# Patient Record
Sex: Female | Born: 1945 | Race: White | Hispanic: No | Marital: Married | Smoking: Never smoker
Health system: Southern US, Community
[De-identification: ages and names within clinical notes are randomized; demographics above are authoritative.]

## PROBLEM LIST (undated history)

## (undated) DIAGNOSIS — N189 Chronic kidney disease, unspecified: Secondary | ICD-10-CM

## (undated) DIAGNOSIS — K589 Irritable bowel syndrome without diarrhea: Secondary | ICD-10-CM

## (undated) DIAGNOSIS — M8580 Other specified disorders of bone density and structure, unspecified site: Secondary | ICD-10-CM

## (undated) DIAGNOSIS — I10 Essential (primary) hypertension: Secondary | ICD-10-CM

## (undated) DIAGNOSIS — M1990 Unspecified osteoarthritis, unspecified site: Secondary | ICD-10-CM

## (undated) DIAGNOSIS — K219 Gastro-esophageal reflux disease without esophagitis: Secondary | ICD-10-CM

## (undated) DIAGNOSIS — I499 Cardiac arrhythmia, unspecified: Secondary | ICD-10-CM

## (undated) DIAGNOSIS — F429 Obsessive-compulsive disorder, unspecified: Secondary | ICD-10-CM

## (undated) DIAGNOSIS — D0510 Intraductal carcinoma in situ of unspecified breast: Principal | ICD-10-CM

## (undated) DIAGNOSIS — C50919 Malignant neoplasm of unspecified site of unspecified female breast: Secondary | ICD-10-CM

## (undated) DIAGNOSIS — Z8719 Personal history of other diseases of the digestive system: Secondary | ICD-10-CM

## (undated) DIAGNOSIS — F32A Depression, unspecified: Secondary | ICD-10-CM

## (undated) DIAGNOSIS — J189 Pneumonia, unspecified organism: Secondary | ICD-10-CM

## (undated) DIAGNOSIS — I8393 Asymptomatic varicose veins of bilateral lower extremities: Secondary | ICD-10-CM

## (undated) DIAGNOSIS — T148XXA Other injury of unspecified body region, initial encounter: Secondary | ICD-10-CM

## (undated) DIAGNOSIS — F419 Anxiety disorder, unspecified: Secondary | ICD-10-CM

## (undated) DIAGNOSIS — N6099 Unspecified benign mammary dysplasia of unspecified breast: Secondary | ICD-10-CM

## (undated) DIAGNOSIS — F329 Major depressive disorder, single episode, unspecified: Secondary | ICD-10-CM

## (undated) HISTORY — PX: DILATION AND CURETTAGE OF UTERUS: SHX78

## (undated) HISTORY — PX: UPPER GI ENDOSCOPY: SHX6162

## (undated) HISTORY — DX: Other specified disorders of bone density and structure, unspecified site: M85.80

## (undated) HISTORY — DX: Other injury of unspecified body region, initial encounter: T14.8XXA

## (undated) HISTORY — DX: Gastro-esophageal reflux disease without esophagitis: K21.9

## (undated) HISTORY — PX: FOOT SURGERY: SHX648

## (undated) HISTORY — PX: COLONOSCOPY: SHX174

## (undated) HISTORY — DX: Essential (primary) hypertension: I10

## (undated) HISTORY — DX: Unspecified osteoarthritis, unspecified site: M19.90

## (undated) HISTORY — DX: Intraductal carcinoma in situ of unspecified breast: D05.10

## (undated) HISTORY — PX: EYE SURGERY: SHX253

## (undated) HISTORY — PX: BREAST BIOPSY: SHX20

## (undated) HISTORY — PX: DIAGNOSTIC LAPAROSCOPY: SUR761

## (undated) HISTORY — PX: TUBAL LIGATION: SHX77

## (undated) HISTORY — DX: Malignant neoplasm of unspecified site of unspecified female breast: C50.919

## (undated) HISTORY — DX: Unspecified benign mammary dysplasia of unspecified breast: N60.99

---

## 1999-07-26 ENCOUNTER — Other Ambulatory Visit: Admission: RE | Admit: 1999-07-26 | Discharge: 1999-07-26 | Payer: Self-pay | Admitting: Obstetrics and Gynecology

## 2000-03-06 ENCOUNTER — Ambulatory Visit (HOSPITAL_BASED_OUTPATIENT_CLINIC_OR_DEPARTMENT_OTHER): Admission: RE | Admit: 2000-03-06 | Discharge: 2000-03-06 | Payer: Self-pay | Admitting: Surgery

## 2000-08-04 ENCOUNTER — Other Ambulatory Visit: Admission: RE | Admit: 2000-08-04 | Discharge: 2000-08-04 | Payer: Self-pay | Admitting: Obstetrics and Gynecology

## 2001-08-10 ENCOUNTER — Other Ambulatory Visit: Admission: RE | Admit: 2001-08-10 | Discharge: 2001-08-10 | Payer: Self-pay | Admitting: Obstetrics and Gynecology

## 2002-04-13 ENCOUNTER — Encounter (INDEPENDENT_AMBULATORY_CARE_PROVIDER_SITE_OTHER): Payer: Self-pay

## 2002-04-13 ENCOUNTER — Ambulatory Visit (HOSPITAL_COMMUNITY): Admission: RE | Admit: 2002-04-13 | Discharge: 2002-04-13 | Payer: Self-pay | Admitting: Obstetrics and Gynecology

## 2002-08-23 ENCOUNTER — Other Ambulatory Visit: Admission: RE | Admit: 2002-08-23 | Discharge: 2002-08-23 | Payer: Self-pay | Admitting: Obstetrics and Gynecology

## 2003-02-19 HISTORY — PX: CERVICAL FUSION: SHX112

## 2003-03-16 ENCOUNTER — Inpatient Hospital Stay (HOSPITAL_COMMUNITY): Admission: RE | Admit: 2003-03-16 | Discharge: 2003-03-18 | Payer: Self-pay | Admitting: Neurosurgery

## 2003-03-16 ENCOUNTER — Encounter: Payer: Self-pay | Admitting: Neurosurgery

## 2003-09-23 ENCOUNTER — Other Ambulatory Visit: Admission: RE | Admit: 2003-09-23 | Discharge: 2003-09-23 | Payer: Self-pay | Admitting: Obstetrics and Gynecology

## 2004-10-08 ENCOUNTER — Other Ambulatory Visit: Admission: RE | Admit: 2004-10-08 | Discharge: 2004-10-08 | Payer: Self-pay | Admitting: Obstetrics and Gynecology

## 2005-10-09 ENCOUNTER — Other Ambulatory Visit: Admission: RE | Admit: 2005-10-09 | Discharge: 2005-10-09 | Payer: Self-pay | Admitting: Obstetrics and Gynecology

## 2006-09-05 ENCOUNTER — Encounter: Admission: RE | Admit: 2006-09-05 | Discharge: 2006-09-05 | Payer: Self-pay | Admitting: Family Medicine

## 2006-10-09 ENCOUNTER — Other Ambulatory Visit: Admission: RE | Admit: 2006-10-09 | Discharge: 2006-10-09 | Payer: Self-pay | Admitting: Obstetrics & Gynecology

## 2007-10-13 ENCOUNTER — Other Ambulatory Visit: Admission: RE | Admit: 2007-10-13 | Discharge: 2007-10-13 | Payer: Self-pay | Admitting: Obstetrics and Gynecology

## 2008-10-27 ENCOUNTER — Other Ambulatory Visit: Admission: RE | Admit: 2008-10-27 | Discharge: 2008-10-27 | Payer: Self-pay | Admitting: Obstetrics & Gynecology

## 2008-10-31 ENCOUNTER — Encounter: Admission: RE | Admit: 2008-10-31 | Discharge: 2008-10-31 | Payer: Self-pay | Admitting: Neurosurgery

## 2008-11-18 ENCOUNTER — Encounter: Admission: RE | Admit: 2008-11-18 | Discharge: 2008-11-18 | Payer: Self-pay | Admitting: Neurosurgery

## 2008-12-09 ENCOUNTER — Encounter: Admission: RE | Admit: 2008-12-09 | Discharge: 2008-12-09 | Payer: Self-pay | Admitting: Neurosurgery

## 2009-10-10 ENCOUNTER — Encounter: Admission: RE | Admit: 2009-10-10 | Discharge: 2009-10-10 | Payer: Self-pay | Admitting: Family Medicine

## 2009-12-10 ENCOUNTER — Encounter: Admission: RE | Admit: 2009-12-10 | Discharge: 2009-12-10 | Payer: Self-pay | Admitting: Radiology

## 2010-01-01 ENCOUNTER — Ambulatory Visit (HOSPITAL_BASED_OUTPATIENT_CLINIC_OR_DEPARTMENT_OTHER): Admission: RE | Admit: 2010-01-01 | Discharge: 2010-01-01 | Payer: Self-pay | Admitting: Surgery

## 2010-01-15 ENCOUNTER — Ambulatory Visit: Payer: Self-pay | Admitting: Oncology

## 2010-01-15 LAB — CBC WITH DIFFERENTIAL/PLATELET
BASO%: 0.4 % (ref 0.0–2.0)
LYMPH%: 25 % (ref 14.0–49.7)
MCH: 33.1 pg (ref 25.1–34.0)
MCHC: 34 g/dL (ref 31.5–36.0)
MCV: 97.2 fL (ref 79.5–101.0)
MONO%: 10.6 % (ref 0.0–14.0)
Platelets: 269 10*3/uL (ref 145–400)
RBC: 4.14 10*6/uL (ref 3.70–5.45)
WBC: 8.4 10*3/uL (ref 3.9–10.3)

## 2010-01-23 ENCOUNTER — Ambulatory Visit: Admission: RE | Admit: 2010-01-23 | Discharge: 2010-04-18 | Payer: Self-pay | Admitting: Radiation Oncology

## 2010-03-29 ENCOUNTER — Ambulatory Visit: Payer: Self-pay | Admitting: Oncology

## 2010-04-02 LAB — CBC WITH DIFFERENTIAL/PLATELET
Basophils Absolute: 0 10*3/uL (ref 0.0–0.1)
Eosinophils Absolute: 0.1 10*3/uL (ref 0.0–0.5)
HGB: 14.1 g/dL (ref 11.6–15.9)
MCV: 95.7 fL (ref 79.5–101.0)
MONO%: 10.8 % (ref 0.0–14.0)
NEUT#: 3.1 10*3/uL (ref 1.5–6.5)
RDW: 13.2 % (ref 11.2–14.5)

## 2010-04-02 LAB — BASIC METABOLIC PANEL
BUN: 16 mg/dL (ref 6–23)
Chloride: 103 mEq/L (ref 96–112)
Glucose, Bld: 77 mg/dL (ref 70–99)
Potassium: 4.2 mEq/L (ref 3.5–5.3)

## 2010-04-30 ENCOUNTER — Ambulatory Visit: Payer: Self-pay | Admitting: Oncology

## 2010-04-30 LAB — CBC WITH DIFFERENTIAL/PLATELET
Eosinophils Absolute: 0.1 10*3/uL (ref 0.0–0.5)
MONO#: 0.5 10*3/uL (ref 0.1–0.9)
NEUT#: 2.1 10*3/uL (ref 1.5–6.5)
RBC: 4.16 10*6/uL (ref 3.70–5.45)
RDW: 13.5 % (ref 11.2–14.5)
WBC: 3.6 10*3/uL — ABNORMAL LOW (ref 3.9–10.3)
lymph#: 0.9 10*3/uL (ref 0.9–3.3)

## 2010-04-30 LAB — COMPREHENSIVE METABOLIC PANEL
Albumin: 4.1 g/dL (ref 3.5–5.2)
Alkaline Phosphatase: 43 U/L (ref 39–117)
CO2: 24 mEq/L (ref 19–32)
Chloride: 101 mEq/L (ref 96–112)
Glucose, Bld: 87 mg/dL (ref 70–99)
Potassium: 4.4 mEq/L (ref 3.5–5.3)
Sodium: 136 mEq/L (ref 135–145)
Total Protein: 7 g/dL (ref 6.0–8.3)

## 2010-04-30 LAB — LIPID PANEL
Cholesterol: 240 mg/dL — ABNORMAL HIGH (ref 0–200)
LDL Cholesterol: 128 mg/dL — ABNORMAL HIGH (ref 0–99)
Triglycerides: 69 mg/dL (ref <150)

## 2010-06-14 ENCOUNTER — Ambulatory Visit: Payer: Self-pay | Admitting: Oncology

## 2010-06-18 LAB — COMPREHENSIVE METABOLIC PANEL
Albumin: 4.2 g/dL (ref 3.5–5.2)
BUN: 16 mg/dL (ref 6–23)
CO2: 30 mEq/L (ref 19–32)
Glucose, Bld: 107 mg/dL — ABNORMAL HIGH (ref 70–99)
Potassium: 5.1 mEq/L (ref 3.5–5.3)
Sodium: 139 mEq/L (ref 135–145)
Total Protein: 6.9 g/dL (ref 6.0–8.3)

## 2010-06-18 LAB — CBC WITH DIFFERENTIAL/PLATELET
Basophils Absolute: 0 10*3/uL (ref 0.0–0.1)
Eosinophils Absolute: 0.1 10*3/uL (ref 0.0–0.5)
LYMPH%: 18.8 % (ref 14.0–49.7)
MONO#: 0.6 10*3/uL (ref 0.1–0.9)
Platelets: 260 10*3/uL (ref 145–400)
RBC: 4.35 10*6/uL (ref 3.70–5.45)

## 2010-06-21 HISTORY — PX: BREAST LUMPECTOMY: SHX2

## 2010-06-27 ENCOUNTER — Ambulatory Visit (HOSPITAL_BASED_OUTPATIENT_CLINIC_OR_DEPARTMENT_OTHER): Admission: RE | Admit: 2010-06-27 | Discharge: 2010-06-27 | Payer: Self-pay | Admitting: Surgery

## 2010-08-30 ENCOUNTER — Ambulatory Visit: Payer: Self-pay | Admitting: Oncology

## 2010-09-03 LAB — COMPREHENSIVE METABOLIC PANEL
ALT: 14 U/L (ref 0–35)
AST: 22 U/L (ref 0–37)
CO2: 27 mEq/L (ref 19–32)
Chloride: 101 mEq/L (ref 96–112)
Sodium: 135 mEq/L (ref 135–145)
Total Bilirubin: 0.3 mg/dL (ref 0.3–1.2)
Total Protein: 6.9 g/dL (ref 6.0–8.3)

## 2010-09-03 LAB — CBC WITH DIFFERENTIAL/PLATELET
BASO%: 0.5 % (ref 0.0–2.0)
EOS%: 3.2 % (ref 0.0–7.0)
LYMPH%: 26.6 % (ref 14.0–49.7)
MCHC: 34.9 g/dL (ref 31.5–36.0)
MONO#: 0.6 10*3/uL (ref 0.1–0.9)
RBC: 3.97 10*6/uL (ref 3.70–5.45)
WBC: 4.7 10*3/uL (ref 3.9–10.3)
lymph#: 1.2 10*3/uL (ref 0.9–3.3)

## 2010-09-10 ENCOUNTER — Ambulatory Visit (HOSPITAL_COMMUNITY)
Admission: RE | Admit: 2010-09-10 | Discharge: 2010-09-10 | Payer: Self-pay | Source: Home / Self Care | Admitting: Oncology

## 2010-10-21 HISTORY — PX: KNEE ARTHROSCOPY: SUR90

## 2010-11-11 ENCOUNTER — Encounter: Payer: Self-pay | Admitting: Obstetrics and Gynecology

## 2010-12-13 ENCOUNTER — Other Ambulatory Visit: Payer: Self-pay | Admitting: Radiation Oncology

## 2010-12-13 ENCOUNTER — Ambulatory Visit: Payer: BC Managed Care – PPO | Attending: Radiation Oncology | Admitting: Radiation Oncology

## 2010-12-13 DIAGNOSIS — Z9889 Other specified postprocedural states: Secondary | ICD-10-CM

## 2011-01-03 LAB — POCT I-STAT, CHEM 8
Calcium, Ion: 1.12 mmol/L (ref 1.12–1.32)
Creatinine, Ser: 1 mg/dL (ref 0.4–1.2)
Hemoglobin: 15.6 g/dL — ABNORMAL HIGH (ref 12.0–15.0)
Sodium: 137 mEq/L (ref 135–145)
TCO2: 27 mmol/L (ref 0–100)

## 2011-01-11 LAB — BASIC METABOLIC PANEL
BUN: 15 mg/dL (ref 6–23)
CO2: 29 mEq/L (ref 19–32)
Calcium: 9.5 mg/dL (ref 8.4–10.5)
Creatinine, Ser: 0.96 mg/dL (ref 0.4–1.2)
GFR calc non Af Amer: 59 mL/min — ABNORMAL LOW (ref >60)
Glucose, Bld: 64 mg/dL — ABNORMAL LOW (ref 70–99)
Sodium: 137 mEq/L (ref 135–145)

## 2011-03-08 NOTE — Op Note (Signed)
[facility]  Patient:    Morgan Elliott, Morgan Elliott Visit Number: [id number] MRN: [id number]          Service Type: DSU Location: DAY Attending Physician:  Rosalee Kaufman Dictated by:   Harl Bowie, M.D. Proc. Date: 04/13/02 Admit Date:  04/13/2002                             Operative Report  PREOPERATIVE DIAGNOSIS:  Menometrorrhagia with hormone replacement therapy and history of endometrial polyps.  POSTOPERATIVE DIAGNOSES:  Menometrorrhagia with hormone replacement therapy and history of endometrial polyps.  OPERATION:  D&C.  SURGEON:  Harl Bowie, M.D.  ANESTHESIA:  MAC with local.  FINDINGS AND PROCEDURE:  The patient was prepped and draped in the usual sterile fashion for vaginal procedure.  The patient examined and found to have a uterus normal size in the mid position.  Following, the cervix was grasped with a single-tooth tenaculum and the cervix was sounded to three inches.  The cervix was then dilated and the cavity entered with a #2 sharp curet.  A large amount of polypoid tissue was obtained.  No additional tissue was obtained with the Randall-Stone forceps and a serrated curet.  BLOOD LOSS:  Minimal.  DISPOSITION:  The patient tolerated the procedure well and was sent to the recovery room in good condition. Dictated by:   Harl Bowie, M.D. Attending Physician:  Rosalee Kaufman DD:  04/13/02 TD:  04/14/02 Job: [id number]

## 2011-03-08 NOTE — Op Note (Signed)
[place]  Patient:    Morgan Elliott, Morgan Elliott                       MRN: [id number] Proc. Date: 03/06/00 Adm. Date:  [id number] Disc. Date: [id number] Attending:  Charlton Haws CC:         [facility]             Jeralyn Ruths, M.D.                           Operative Report  CCS#: [id number]  PREOPERATIVE DIAGNOSIS:  Right breast mass upper/inner quadrant.  POSTOPERATIVE DIAGNOSIS:  Right breast mass upper/inner quadrant.  OPERATION:  Removal of right breast mass.  SURGEON:  Currie Paris, M.D.  ASSISTANTLorin Picket Long, PA-student.  ANESTHESIA:  MAC.  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  DESCRIPTION OF PROCEDURE:  Patient brought to the operating room and the mass was identified and the area of the skin over it marked.  She was then given IV sedation.  The breast was prepped and draped.  A combination of 1% Xylocaine plus 0.5% Marcaine was mixed equally and used for local.  The incision was made.  Subcutaneous tissues divided and the mass was palpable.  I was able to grasp the breast tissue with a towel clip and elevate it and this slid off of the mass, but the second towel clip went through it and used that for traction.  The mass was excised using cautery and had a little rim of normal breast tissue around it.  The incision was checked for hemostasis and when dry, closed with 3-0 Vicryl, followed by 4-0 Monocryl subcuticular plus and Steri-Strips.  Patient tolerated the procedure well.  There were no operative complications.  All counts were correct. DD:  03/06/00 TD:  03/11/00 Job: [id number]

## 2011-03-08 NOTE — Op Note (Signed)
NAME:  Morgan Elliott, Morgan Elliott                          ACCOUNT NO.:  [id number]   MEDICAL RECORD NO.:  [id number]                   PATIENT TYPE:  INP   LOCATION:  3172                                 FACILITY:  [facility]   PHYSICIAN:  Hewitt Shorts, M.D.            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   POSTOPERATIVE DIAGNOSIS:  C4-5, C5-6 and C6-7 degenerative disk disease and  spondylosis with radiculopathy and C3-4 instability.   OPERATION PERFORMED:  C3-4, C4-5, C5-6 and C6-7 anterior cervical diskectomy  and arthrodesis with iliac crest allograft and Premier cervical plating.   SURGEON:  Hewitt Shorts, M.D.   ASSISTANT:  Coletta Memos, M.D.   ANESTHESIA:  General endotracheal.   INDICATIONS FOR PROCEDURE:  The patient is a 65 year old woman who presented  with weakness in the left upper extremity who was found to have advanced  spondylosis and degenerative disk disease C4-5, C5-6 and C6-7.  __________ x-  ray showed hypermobility consistent with instability at the C3-4 level and  decision was made to proceed with a 4 level anterior cervical diskectomy and  arthrodesis.   DESCRIPTION OF PROCEDURE:  The patient was brought to the operating room and  placed under general endotracheal anesthesia.  The patient was placed in 10  pounds of halter traction and the neck was prepped with Betadine soap and  solution and draped in sterile fashion.  An oblique incision was made in the  left side of the neck.  The line of incision was infiltrated with local  anesthetic with epinephrine.  The incision followed the anterior border of  the left sternocleidomastoid.  Dissection was carried down through the  subcutaneous tissue to the platysma and then dissection was  carried out to  an avascular plane leaving the sternocleidomastoid muscle, carotid artery  and jugular vein laterally and trachea and esophagus medially.  The ventral  aspect of the vertebral column was identified and localizing x-ray was taken  and the C3-4, C4-5, C5-6 and C6-7 intervertebral disk spaces were  identified.  Anterior osteophytic overgrowth was removed and the annulus was  incised at each level and the disk space entered.  Diskectomy was performed  using microcurets and pituitary rongeurs.  The cartilaginous end plates of  each of the corresponding vertebrae was removed using the microcurets along  with Micromax drill.  The operating microscope was draped and brought into  the field to provide additional magnification, illumination and  visualization and the remainder of the diskectomy was performed at each  level using microdissection and microsurgical technique.  Posterior  osteophytic overgrowth was removed using the Micromax drill along with the 2  mm Kerrison punch and a thin foot plate.  The posterior longitudinal  ligament which was thickened particularly at the lower three levels was  removed at each of the levels and good decompression of the spinal canal and  thecal sac was achieved.  The foramina were also carefully decompressed,  particularly again at the C4-5, C5-6 and C6-7 levels and then we soaked four  iliac crest allografts.  Three of them 6 mm in height and one 7 mm in height  in antibiotic solution and then placed the three 6 mm grafts at the C4-5, C5-  6 and C6-7 level and a 7 mm graft at the C3-4 level.  We then selected a 70  mm Premier cervical plate and it was positioned over the fusion construct  and secured to each of the vertebrae with a pair of 4.0 variable angle  screws.  We used screws of 15 mm in length at C3, C4, C6 and C7 and 13 mm in  length at C5.  Each screw hole was drilled and the screw placed.  All 10  screws were fully tightened and  then the locking system was secured.  X-rays  were taken during the plating procedure to confirm the position of the  interbody graft as well as of the screws and plate and an x-ray was taken  also at the end of the plating.  All 10 screws were in good position.  The  wound was irrigated with antibiotic solution, checked for hemostasis which  was established and confirmed and then we proceeded with  closure.  The  platysma was closed with interrupted inverted 2-0 undyed Vicryl sutures and  the subcutaneous and subcuticular layer closed with interrupted inverted 3-0  undyed Vicryl sutures and the skin edges reapproximated with Dermabond.  The  patient tolerated the procedure well.  The estimated blood loss was 50mL.  Sponge and instrument counts were correct.  Following surgery, the patient  was then taken out of traction, placed in an Aspen collar, reversed from  anesthetic and transferred to the recovery room for further care where she  was noted to be moving all four extremities to command.                                                Hewitt Shorts, M.D.    RWN/MEDQ  D:  03/16/2003  T:  03/16/2003  Job:  [id number]

## 2011-03-11 ENCOUNTER — Other Ambulatory Visit: Payer: Self-pay | Admitting: Dermatology

## 2011-03-13 ENCOUNTER — Encounter (INDEPENDENT_AMBULATORY_CARE_PROVIDER_SITE_OTHER): Payer: BC Managed Care – PPO | Admitting: Vascular Surgery

## 2011-03-13 ENCOUNTER — Encounter (INDEPENDENT_AMBULATORY_CARE_PROVIDER_SITE_OTHER): Payer: BC Managed Care – PPO

## 2011-03-13 DIAGNOSIS — I83893 Varicose veins of bilateral lower extremities with other complications: Secondary | ICD-10-CM

## 2011-03-13 NOTE — Consult Note (Signed)
NEW PATIENT CONSULTATION  Morgan Elliott, Morgan Elliott DOB:  1946-09-18                                       03/13/2011 CHART#:[id number]  Patient presents today for evaluation of lower extremity discomfort. This is a very pleasant 65-year [place] female with concern regarding a recent episode of erythema in her left posterior calf.  Reportedly, she had erythema and did resolve after several weeks of elevation and compression.  She has chronically had some increased swelling in her left leg around her calf and ankle versus her right and controls this with elevation and compression.  She does have a diagnosis of a left Baker's cyst as well.  PAST HISTORY:  Significant for a cervical disk fusion, lumpectomy for breast cancer.  She is treated medically for hypertension and elevated cholesterol.  FAMILY HISTORY:  Her mother died at age 95 of a heart attack.  She was a smoker.  SOCIAL HISTORY:  She is married with 1 child.  She is retired.  She does not smoke, has several glasses of wine per week.  REVIEW OF SYSTEMS:  No weight loss or gain.  She weighs 160 pounds.  She is 5 feet 7 inches tall. CARDIAC:  Positive for history of tachycardia. GI:  For reflux and diarrhea. VASCULAR:  Pain in legs with walking and phlebitis. MUSCULOSKELETAL:  Arthritis joint pain and muscle pain. PSYCHIATRIC:  Depression. Review of systems otherwise negative.  PHYSICAL EXAMINATION:  A well-developed and well-nourished [place] female appearing stated age in no acute distress.  Blood pressure is 151/79, pulse 65, respirations 16.  HEENT is normal.  Her radial and dorsalis pedis pulses are 2+ bilaterally.  Musculoskeletal shows no major deformities or cyanosis.  Neurologic:  No focal weakness or paresthesias.  Skin without ulcers or rashes.  She does have an area in her right medial calf with some thickening and prominence.  On duplex this did not appear to be thrombophlebitis and no evidence of a  severe reflux in her right leg.  On the left, she does have reflux in the deep system, and no evidence of DVT, and does have reflux in her left great saphenous vein.  On imaging this, the vein is slightly enlarged.  She does have some tributary branches off this.  I discussed the significance of this with patient.  I feel that her left leg swelling is related to venous hypertension.  It appears that this is most likely related to deep venous reflux.  She does have some reflux in the great saphenous vein, but I have recommended against any specific treatment regarding this since I feel that this would have a minor impact.  She does not have any varicosities.  The area in her right thigh I do not feel is related to venous pathology, and she will continue to discuss this with Dr. Corliss Blacker if this persists.  She was reassured with this discussion.  Would recommend continued elevation and compression as she is doing, which is keeping her symptoms well controlled.  She understands there are treatment options should she have progression of symptoms.    Larina Earthly, M.D. Electronically Signed  TFE/MEDQ  D:  03/13/2011  T:  03/13/2011  Job:  [id number]  cc:   Pam Drown, M.D.

## 2011-03-20 NOTE — Procedures (Unsigned)
LOWER EXTREMITY VENOUS REFLUX EXAM  INDICATION:  Varicose veins.  EXAM:  Using color-flow imaging and pulse Doppler spectral analysis, the right and left common femoral, superficial femoral, popliteal, posterior tibial, greater and lesser saphenous veins are evaluated.  There is no evidence suggesting deep venous insufficiency in the right and left lower extremities.  The right saphenofemoral junction is competent. The left saphenofemoral junction is not competent with reflux >500 milliseconds.  The right GSV is competent.  The left GSV is not competent with Reflux >529milliseconds with the caliber as described below.  The right and left proximal small saphenous veins demonstrate competency.  GSV Diameter (used if found to be incompetent only)                                                    Right      Left Proximal Greater Saphenous Vein                    cm         0.55 cm Proximal-to-mid-thigh                              cm         0.51 cm Mid thigh                                          cm         0.30 cm Mid-distal thigh                                   cm         cm Distal thigh                                       cm         0.29 cm Knee                                               cm         cm  IMPRESSION: 1. The right great saphenous vein is competent.  The left greater     saphenous vein is not competent with reflux >500 milliseconds. 2. The right and left greater saphenous veins are not tortuous. 3. The deep venous system is competent. 4. The right and left small saphenous vein is competent.  ___________________________________________ Larina Earthly, M.D.  OD/MEDQ  D:  03/13/2011  T:  03/13/2011  Job:  [id number]

## 2011-04-11 ENCOUNTER — Other Ambulatory Visit: Payer: Self-pay | Admitting: Dermatology

## 2011-04-22 ENCOUNTER — Other Ambulatory Visit: Payer: Self-pay | Admitting: Gastroenterology

## 2011-05-23 ENCOUNTER — Encounter (HOSPITAL_BASED_OUTPATIENT_CLINIC_OR_DEPARTMENT_OTHER): Payer: BC Managed Care – PPO

## 2011-05-23 DIAGNOSIS — D0590 Unspecified type of carcinoma in situ of unspecified breast: Secondary | ICD-10-CM

## 2011-05-29 ENCOUNTER — Ambulatory Visit
Admission: RE | Admit: 2011-05-29 | Discharge: 2011-05-29 | Disposition: A | Discharge status: Home / Self Care | Payer: BC Managed Care – PPO | Source: Ambulatory Visit | Attending: Radiation Oncology | Admitting: Radiation Oncology

## 2011-05-29 ENCOUNTER — Other Ambulatory Visit: Payer: Self-pay | Admitting: Radiation Oncology

## 2011-05-29 DIAGNOSIS — Z9889 Other specified postprocedural states: Secondary | ICD-10-CM

## 2011-06-13 ENCOUNTER — Ambulatory Visit
Admission: RE | Admit: 2011-06-13 | Discharge: 2011-06-13 | Disposition: A | Discharge status: Home / Self Care | Payer: BC Managed Care – PPO | Source: Ambulatory Visit | Attending: Radiation Oncology | Admitting: Radiation Oncology

## 2011-07-03 ENCOUNTER — Ambulatory Visit (HOSPITAL_BASED_OUTPATIENT_CLINIC_OR_DEPARTMENT_OTHER)
Admission: RE | Admit: 2011-07-03 | Discharge: 2011-07-03 | Disposition: A | Discharge status: Home / Self Care | Payer: BC Managed Care – PPO | Source: Ambulatory Visit | Attending: Orthopedic Surgery | Admitting: Orthopedic Surgery

## 2011-07-03 DIAGNOSIS — R9431 Abnormal electrocardiogram [ECG] [EKG]: Secondary | ICD-10-CM | POA: Insufficient documentation

## 2011-07-03 DIAGNOSIS — M25569 Pain in unspecified knee: Secondary | ICD-10-CM | POA: Insufficient documentation

## 2011-07-03 DIAGNOSIS — M2240 Chondromalacia patellae, unspecified knee: Secondary | ICD-10-CM | POA: Insufficient documentation

## 2011-07-03 DIAGNOSIS — X58XXXA Exposure to other specified factors, initial encounter: Secondary | ICD-10-CM | POA: Insufficient documentation

## 2011-07-03 DIAGNOSIS — S83289A Other tear of lateral meniscus, current injury, unspecified knee, initial encounter: Secondary | ICD-10-CM | POA: Insufficient documentation

## 2011-07-03 DIAGNOSIS — Z01812 Encounter for preprocedural laboratory examination: Secondary | ICD-10-CM | POA: Insufficient documentation

## 2011-07-03 LAB — POCT I-STAT 4, (NA,K, GLUC, HGB,HCT)
Hemoglobin: 13.9 g/dL (ref 12.0–15.0)
Potassium: 3.9 mEq/L (ref 3.5–5.1)

## 2011-07-10 NOTE — Op Note (Signed)
  NAMEANDREEA, Morgan Elliott                ACCOUNT NO.:  [id number]  MEDICAL RECORD NO.:  [id number]  LOCATION:                               FACILITY:  [facility]  PHYSICIAN:  Ollen Gross, M.D.    DATE OF BIRTH:  [birth year]  DATE OF PROCEDURE:  07/03/2011 DATE OF DISCHARGE:                              OPERATIVE REPORT   PREOPERATIVE DIAGNOSIS:  Left knee lateral meniscal tear.  POSTOPERATIVE DIAGNOSES:  Left knee lateral meniscal tear plus chondral defect.  PROCEDURE:  Left knee arthroscopy with meniscal debridement and chondroplasty.  SURGEON:  Ollen Gross, MD  ASSISTANT:  None.  ANESTHESIA:  General.  ESTIMATED BLOOD LOSS:  Minimal.  DRAIN:  None.  COMPLICATIONS:  None.  CONDITION:  Stable to Recovery.  BRIEF CLINICAL NOTE:  [name] is a 65 year old female with a several- month history of significant left knee pain and mechanical symptoms. Exam and history suggested a lateral meniscal tear, confirmed by MRI. She presents now for arthroscopy and debridement.  PROCEDURE IN DETAIL:  After successful administration of general anesthetic, a tourniquet was placed high on her left thigh, and her left lower extremity was prepped and draped in usual sterile fashion. Standard superomedial and inferolateral incisions were made, inflow cannula passed superomedial and camera passed inferolateral. Arthroscopic visualization proceeds.  Undersurface patella showed some grade 3 change superiorly, but the main body of the patella, medial, lateral, and inferior, all looked normal.  The trochlea looked normal. Median and lateral gutters were visualized and there were no loose bodies.  Flexion and valgus force was applied to the knee and the medial compartment was entered.  A small area of chondral delamination at the central most aspect in the medial compartment.  It was about of 0.5 x 0.5 cm.  Spinal needle was used to localize the inferomedial portal, small incision made, dilator  placed.  A shaver was placed to remove the unstable cartilage.  It was partial thickness cartilage loss.  There was still a significant amount of stable, healthy appearing cartilage underneath.  The rest of medial compartment looked normal. Intercondylar notch was visualized, the ACL was normal.  Lateral compartment was entered, it was a bad tear in the body and posterior horn of lateral meniscus.  There was also some chondromalacia in the lateral compartment, but no evidence of any unstable cartilage and no evidence of any full-thickness defects.  The meniscus was debrided back to a stable base with baskets and a 4.2 mm shaver and then sealed off with the ArthroCare device.  The joint was again inspected, no other tears, defects, or loose bodies were noted.  The arthroscopic equipment was then removed from the inferior portals which were closed with interrupted 4-0 nylon.  A 20 cc of 0.25% Marcaine with epinephrine injected through the inflow cannula and that was removed and that portal closed with nylon.  A bulky sterile dressing was applied and she was awakened and transported to Recovery in stable condition.     Ollen Gross, M.D.     FA/MEDQ  D:  07/03/2011  T:  07/03/2011  Job:  [id number]  Electronically Signed by Ollen Gross M.D. on 07/10/2011 10:08:07 AM

## 2011-11-09 ENCOUNTER — Telehealth: Payer: Self-pay | Admitting: Oncology

## 2011-11-09 NOTE — Telephone Encounter (Signed)
called pt and scheduled f/u for [phone]

## 2011-12-09 ENCOUNTER — Other Ambulatory Visit: Payer: BC Managed Care – PPO | Admitting: Lab

## 2011-12-09 ENCOUNTER — Encounter: Payer: Self-pay | Admitting: Oncology

## 2011-12-09 ENCOUNTER — Ambulatory Visit (HOSPITAL_BASED_OUTPATIENT_CLINIC_OR_DEPARTMENT_OTHER): Payer: BC Managed Care – PPO | Admitting: Oncology

## 2011-12-09 ENCOUNTER — Telehealth: Payer: Self-pay | Admitting: Oncology

## 2011-12-09 DIAGNOSIS — C50912 Malignant neoplasm of unspecified site of left female breast: Secondary | ICD-10-CM | POA: Insufficient documentation

## 2011-12-09 DIAGNOSIS — N6089 Other benign mammary dysplasias of unspecified breast: Secondary | ICD-10-CM

## 2011-12-09 DIAGNOSIS — D0510 Intraductal carcinoma in situ of unspecified breast: Secondary | ICD-10-CM

## 2011-12-09 DIAGNOSIS — D0590 Unspecified type of carcinoma in situ of unspecified breast: Secondary | ICD-10-CM

## 2011-12-09 DIAGNOSIS — N6099 Unspecified benign mammary dysplasia of unspecified breast: Secondary | ICD-10-CM

## 2011-12-09 HISTORY — DX: Intraductal carcinoma in situ of unspecified breast: D05.10

## 2011-12-09 HISTORY — DX: Unspecified benign mammary dysplasia of unspecified breast: N60.99

## 2011-12-09 LAB — CBC WITH DIFFERENTIAL/PLATELET
BASO%: 0.4 % (ref 0.0–2.0)
Eosinophils Absolute: 0.1 10*3/uL (ref 0.0–0.5)
HCT: 37.6 % (ref 34.8–46.6)
HGB: 12.6 g/dL (ref 11.6–15.9)
LYMPH%: 17.5 % (ref 14.0–49.7)
MONO#: 0.6 10*3/uL (ref 0.1–0.9)
NEUT#: 4.6 10*3/uL (ref 1.5–6.5)
NEUT%: 70.6 % (ref 38.4–76.8)
Platelets: 257 10*3/uL (ref 145–400)
WBC: 6.6 10*3/uL (ref 3.9–10.3)
lymph#: 1.1 10*3/uL (ref 0.9–3.3)

## 2011-12-09 LAB — COMPREHENSIVE METABOLIC PANEL
Alkaline Phosphatase: 38 U/L — ABNORMAL LOW (ref 39–117)
Glucose, Bld: 103 mg/dL — ABNORMAL HIGH (ref 70–99)
Sodium: 136 mEq/L (ref 135–145)
Total Bilirubin: 0.3 mg/dL (ref 0.3–1.2)
Total Protein: 6.8 g/dL (ref 6.0–8.3)

## 2011-12-09 MED ORDER — TAMOXIFEN CITRATE 20 MG PO TABS: 20.0000 mg | ORAL_TABLET | Freq: Every day | ORAL | Status: AC

## 2011-12-09 NOTE — Progress Notes (Signed)
OFFICE PROGRESS NOTE  CC  MCNEILL,WENDY, MD, MD [address], [address] [place] [address] Dr. Lurline Hare Dr. 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 The final pathology revealed atypical ductal hyperplasia. Otherwise there were noted to be fibrocystic disease on the final lumpectomy specimen.  #2 in the right breast patient was found to have ductal carcinoma in situ stage 0  #3 patient completed radiation therapy to the right breast  #4 she was then begun on tamoxifen 20 mg daily starting in August 2011.  CURRENT THERAPY: Tamoxifen 20 mg daily since August 2011  INTERVAL HISTORY: Morgan Elliott 66 y.o. female returns for Followup visit today. Overall she is doing well. Her last mammogram was in August 2012. Today she is without Any significant complaints. She is tolerating the tamoxifen well. She does have some periodic hot flashes. She does note having some arthritis of the left knee. She has had some bursitis type of problems in her right hip she has undergone physical therapy as well as steroid injections with some relief. She has not had any blood clots. She has no vaginal bleeding. She denies any visual disturbances. She denies any nausea vomiting fevers chills night sweats headaches shortness of breath chest pains palpitations. Remainder of the 10 point review of systems is negative.  MEDICAL HISTORY: Past Medical History  Diagnosis Date  . 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 Breast cancer   . Arthritis     ALLERGIES:   has no known allergies.  MEDICATIONS:  Current Outpatient Prescriptions  Medication Sig Dispense Refill  . amLODipine-benazepril (LOTREL)  5-10 MG per capsule Take 1 capsule by mouth daily.      . calcium carbonate (OS-CAL) 600 MG TABS Take 600 mg by mouth 2 (two) times daily with a meal.      . cetirizine (ZYRTEC) 10 MG tablet Take 10 mg by mouth daily.      . cholecalciferol (VITAMIN D) 400 UNITS TABS Take by mouth.      . citalopram (CELEXA) 20 MG tablet Take 20 mg by mouth daily.      Marland Kitchen co-enzyme Q-10 30 MG capsule Take 30 mg by mouth 3 (three) times daily.      . fluticasone (FLONASE) 50 MCG/ACT nasal spray Place 1 spray into the nose daily.      . Multiple Vitamins-Minerals (CENTRUM SILVER PO) Take 1 each by mouth.      . ranitidine (ZANTAC) 150 MG capsule Take 150 mg by mouth daily.      . zaleplon (SONATA) 5 MG capsule Take 5 mg by mouth at bedtime.      . tamoxifen (NOLVADEX) 20 MG tablet Take 1 tablet (20 mg total) by mouth daily.  90 tablet  12    SURGICAL HISTORY:  Past Surgical History  Procedure Date  . Breast lumpectomy 06/2010    REVIEW OF SYSTEMS:  Pertinent items are noted in HPI.   PHYSICAL EXAMINATION: General appearance: alert, cooperative and appears stated age Head: Normocephalic, without obvious abnormality, atraumatic Neck: no adenopathy, no carotid bruit, no JVD, supple, symmetrical, trachea midline and thyroid not enlarged, symmetric, no tenderness/mass/nodules Lymph nodes: Cervical, supraclavicular, and axillary nodes normal. Resp: clear to auscultation bilaterally and  normal percussion bilaterally Back: symmetric, no curvature. ROM normal. No CVA tenderness. Cardio: regular rate and rhythm, S1, S2 normal, no murmur, click, rub or gallop GI: soft, non-tender; bowel sounds normal; no masses,  no organomegaly Extremities: extremities normal, atraumatic, no cyanosis or edema Neurologic: Alert and oriented X 3, normal strength and tone. Normal symmetric reflexes. Normal coordination and gait Bilateral breast examination: Right breast reveals well-healed surgical scars there are lump lumps noted but  they are normal these are not unusual for her. Left breast no masses or nipple discharge.  ECOG PERFORMANCE STATUS: 0 - Asymptomatic  Blood pressure 136/86, pulse 75, temperature 98.5 F (36.9 C), temperature source Oral, height 5\' 7"  (1.702 m), weight 169 lb 4.8 oz (76.794 kg).  LABORATORY DATA: Lab Results  Component Value Date   WBC 6.6 12/09/2011   HGB 12.6 12/09/2011   HCT 37.6 12/09/2011   MCV 96.0 12/09/2011   PLT 257 12/09/2011      Chemistry      Component Value Date/Time   NA 140 07/03/2011 0859   K 3.9 07/03/2011 0859   CL 101 09/03/2010 1012   CO2 27 09/03/2010 1012   BUN 17 09/03/2010 1012   CREATININE 1.00 09/03/2010 1012      Component Value Date/Time   CALCIUM 9.2 09/03/2010 1012   ALKPHOS 41 09/03/2010 1012   AST 22 09/03/2010 1012   ALT 14 09/03/2010 1012   BILITOT 0.3 09/03/2010 1012       RADIOGRAPHIC STUDIES:  No results found.  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 Patient underwent lumpectomy followed by radiation therapy and then was begun on tamoxifen 20 mg daily. She is tolerating this very well.  #2 left atypical ductal hyperplasia she has undergone a lumpectomy of this as well and is doing well without evidence of recurrent disease.  #3 patient will continue to get her yearly diagnostic mammogram.  PLAN:   #1 patient will continue tamoxifen 20 mg daily. Another prescription was given to her for 90 with 12 refills. [facility] uses [facility] prescriptions.  #2 I will plan on seeing her back in 6 months time which will be in September 2013. Prior to that she will have her diagnostic mammograms performed.  #3 patient knows to call with any problems questions or concerns.   All questions were answered. The patient knows to call the clinic with any problems, questions or concerns. We can certainly see the patient much sooner if  necessary.  I spent 30 minutes counseling the patient face to face. The total time spent in the appointment was 30 minutes.    Drue Second, MD Medical/Oncology [facility] [phone] (beeper) [phone] (Office)  12/09/2011, 3:13 PM

## 2011-12-09 NOTE — Patient Instructions (Signed)
Your are doing well, continue tamoxifen as you are. Prescription for #90 with 12 refills

## 2011-12-09 NOTE — Telephone Encounter (Signed)
gve the pt her sept 2013 appt calendar °

## 2012-03-04 DIAGNOSIS — H43819 Vitreous degeneration, unspecified eye: Secondary | ICD-10-CM | POA: Diagnosis not present

## 2012-03-09 ENCOUNTER — Other Ambulatory Visit: Payer: Self-pay | Admitting: Dermatology

## 2012-03-09 DIAGNOSIS — C44711 Basal cell carcinoma of skin of unspecified lower limb, including hip: Secondary | ICD-10-CM | POA: Diagnosis not present

## 2012-03-09 DIAGNOSIS — L708 Other acne: Secondary | ICD-10-CM | POA: Diagnosis not present

## 2012-03-09 DIAGNOSIS — L821 Other seborrheic keratosis: Secondary | ICD-10-CM | POA: Diagnosis not present

## 2012-03-09 DIAGNOSIS — D239 Other benign neoplasm of skin, unspecified: Secondary | ICD-10-CM | POA: Diagnosis not present

## 2012-03-12 ENCOUNTER — Encounter: Payer: Self-pay | Admitting: Radiation Oncology

## 2012-03-12 ENCOUNTER — Ambulatory Visit
Admission: RE | Admit: 2012-03-12 | Discharge: 2012-03-12 | Disposition: A | Discharge status: Home / Self Care | Payer: BC Managed Care – PPO | Source: Ambulatory Visit | Attending: Radiation Oncology | Admitting: Radiation Oncology

## 2012-03-12 ENCOUNTER — Ambulatory Visit (HOSPITAL_COMMUNITY)
Admission: RE | Admit: 2012-03-12 | Discharge: 2012-03-12 | Disposition: A | Discharge status: Home / Self Care | Payer: Medicare Other | Source: Ambulatory Visit | Attending: Radiation Oncology | Admitting: Radiation Oncology

## 2012-03-12 VITALS — BP 121/84 | HR 77 | Temp 97.1°F | Resp 18 | Wt 169.6 lb

## 2012-03-12 DIAGNOSIS — Z79899 Other long term (current) drug therapy: Secondary | ICD-10-CM | POA: Insufficient documentation

## 2012-03-12 DIAGNOSIS — M7989 Other specified soft tissue disorders: Secondary | ICD-10-CM | POA: Insufficient documentation

## 2012-03-12 DIAGNOSIS — D0590 Unspecified type of carcinoma in situ of unspecified breast: Secondary | ICD-10-CM | POA: Insufficient documentation

## 2012-03-12 DIAGNOSIS — D0510 Intraductal carcinoma in situ of unspecified breast: Secondary | ICD-10-CM

## 2012-03-12 DIAGNOSIS — M79609 Pain in unspecified limb: Secondary | ICD-10-CM | POA: Diagnosis not present

## 2012-03-12 DIAGNOSIS — C50419 Malignant neoplasm of upper-outer quadrant of unspecified female breast: Secondary | ICD-10-CM | POA: Diagnosis not present

## 2012-03-12 NOTE — Progress Notes (Signed)
VASCULAR LAB PRELIMINARY  PRELIMINARY  PRELIMINARY  PRELIMINARY  Right lower extremity venous duplex completed.    Preliminary report:  Right:  No evidence of DVT, superficial thrombosis, or Baker's cyst.  Brenlyn Beshara D, RVS 03/12/2012, 3:09 PM

## 2012-03-12 NOTE — Progress Notes (Signed)
HERE TODAY FOR FU OF RIGHT BREAST.   STARTED TAMOXIFEN IN NOV. 2011.  SKIN LOOKS GREAT.  HAS A CONCERN REGARDING RIGHT FOOT AND LEG SWELLING WHEN AMBULATING FOR ANY LENGTH OF TIME.  TOOK OTC DIURETIC AND THIS DIDN'T HELP.  USES COMPRESSION HOSE AND STILL HAD THE EDEMA.  BEFORE THIS HAPPENED HAD A KNOT IN LOWER LEG THAT WAS SORE.  HAD VENOGRAM BUT SAID IT WAS NOT RELATED TO VEIN.  THEY TOLD HER IT COULD BE RELATED TO LYMPH NODE.  IT HAS GONE AWAY NOW.

## 2012-03-12 NOTE — Progress Notes (Signed)
Radiation Oncology         [phone] ________________________________  Name: Morgan Elliott MRN: [id number]  Date: 03/12/2012  DOB: 07-Dec-1945  Follow-Up Visit Note  CC: Gweneth Dimitri, MD, MD  Gweneth Dimitri, MD  Diagnosis:   DCIS of the right breast  Interval Since Last Radiation:  2 years  Narrative:  The patient returns today for routine follow-up.  Breast standpoint she is doing well. She had negative mammograms in August and has a scheduled again for this August. She seen Dr. Jamey Ripa and Dr. Dionne Milo. Her main concern today is of some right lower extremity edema. She states that 4 months ago she had a "lump" on her right leg and was worked up at that time for a blood clot. She was told she did not have one and the lump eventually disappeared. About 2 weeks ago she was on a 14 hour car ride with her husband and had increased right lower sternum and the edema associated with right hip pain. The edema persisted until she went for massage therapist yesterday who performed some massage in the lower extremity. Her swelling is much improved today and her pain is better. She has not noticed anything other than her "normal" swelling on her left which is used around her ankle the end of the day. She continues on tamoxifen. She has not followed up with her primary care physician. He noticed no swelling anywhere else. She was wearing compression stockings at the time of her edema and states that the edema happened even in the presence of her compression stockings.  ALLERGIES:   has no known allergies.  Meds: Current Outpatient Prescriptions  Medication Sig Dispense Refill  . tamoxifen (NOLVADEX) 20 MG tablet Take 20 mg by mouth daily.      Marland Kitchen amLODipine-benazepril (LOTREL) 5-10 MG per capsule Take 1 capsule by mouth daily.      . calcium carbonate (OS-CAL) 600 MG TABS Take 600 mg by mouth 2 (two) times daily with a meal.      . cetirizine (ZYRTEC) 10 MG tablet Take 10 mg by mouth daily.      .  cholecalciferol (VITAMIN D) 400 UNITS TABS Take by mouth.      . citalopram (CELEXA) 20 MG tablet Take 20 mg by mouth daily.      Marland Kitchen co-enzyme Q-10 30 MG capsule Take 30 mg by mouth 3 (three) times daily.      . fluticasone (FLONASE) 50 MCG/ACT nasal spray Place 1 spray into the nose daily.      . Multiple Vitamins-Minerals (CENTRUM SILVER PO) Take 1 each by mouth.      . ranitidine (ZANTAC) 150 MG capsule Take 150 mg by mouth daily.      . zaleplon (SONATA) 5 MG capsule Take 5 mg by mouth at bedtime.        Physical Findings: The patient is in no acute distress. Patient is alert and oriented. . she has no lower extremity swelling bilaterally.  weight is 169 lb 9.6 oz (76.93 kg). Her oral temperature is 97.1 F (36.2 C). Her blood pressure is 121/84 and her pulse is 77. Her respiration is 18. .  No significant changes. Her right breast has multiple scars. In the area of her most recent lumpectomy she has a hollow cavity. No evidence of tumor recurrence. No palpable around these of the left breast. No palpable axillary adenopathy. No palpable supraclavicular adenopathy.  Lab Findings: Lab Results  Component Value Date   WBC  6.6 12/09/2011   HGB 12.6 12/09/2011   HCT 37.6 12/09/2011   MCV 96.0 12/09/2011   PLT 257 12/09/2011      Radiographic Findings: No results found.  Impression:  The patient is doing well. I do not believe this right lower extremity edema is related to her previous radiation. It could however be a blood clot. She does have the risk factors of a long car ride and beyond tamoxifen. We will schedule her for Doppler ultrasounds later this afternoon. I discussed this with Dr. Welton Flakes. We will also encouraged her to followup with her primary care physician. I will see her back in 6 months. She will have her mammograms in August. We will call her with the results of her Doppler.  _____________________________________

## 2012-03-13 ENCOUNTER — Telehealth: Payer: Self-pay | Admitting: Radiation Oncology

## 2012-03-13 NOTE — Telephone Encounter (Signed)
Preliminary doppler ultrasound report shows no DVT. Phoned patient at home number to inform her of this good new but, got no answer. Left message.

## 2012-04-15 DIAGNOSIS — H5231 Anisometropia: Secondary | ICD-10-CM | POA: Diagnosis not present

## 2012-04-15 DIAGNOSIS — H52229 Regular astigmatism, unspecified eye: Secondary | ICD-10-CM | POA: Diagnosis not present

## 2012-04-15 DIAGNOSIS — H04129 Dry eye syndrome of unspecified lacrimal gland: Secondary | ICD-10-CM | POA: Diagnosis not present

## 2012-04-15 DIAGNOSIS — H43819 Vitreous degeneration, unspecified eye: Secondary | ICD-10-CM | POA: Diagnosis not present

## 2012-04-27 ENCOUNTER — Other Ambulatory Visit: Payer: Self-pay | Admitting: Radiation Oncology

## 2012-04-27 DIAGNOSIS — Z853 Personal history of malignant neoplasm of breast: Secondary | ICD-10-CM

## 2012-04-28 ENCOUNTER — Other Ambulatory Visit: Payer: Self-pay | Admitting: Radiation Oncology

## 2012-04-28 DIAGNOSIS — M8580 Other specified disorders of bone density and structure, unspecified site: Secondary | ICD-10-CM

## 2012-05-01 DIAGNOSIS — C44711 Basal cell carcinoma of skin of unspecified lower limb, including hip: Secondary | ICD-10-CM | POA: Diagnosis not present

## 2012-05-13 DIAGNOSIS — M76899 Other specified enthesopathies of unspecified lower limb, excluding foot: Secondary | ICD-10-CM | POA: Diagnosis not present

## 2012-06-03 ENCOUNTER — Ambulatory Visit
Admission: RE | Admit: 2012-06-03 | Discharge: 2012-06-03 | Disposition: A | Discharge status: Home / Self Care | Payer: BLUE CROSS/BLUE SHIELD | Source: Ambulatory Visit | Attending: Radiation Oncology | Admitting: Radiation Oncology

## 2012-06-03 DIAGNOSIS — M8580 Other specified disorders of bone density and structure, unspecified site: Secondary | ICD-10-CM

## 2012-06-03 DIAGNOSIS — Z780 Asymptomatic menopausal state: Secondary | ICD-10-CM | POA: Diagnosis not present

## 2012-06-03 DIAGNOSIS — Z853 Personal history of malignant neoplasm of breast: Secondary | ICD-10-CM

## 2012-06-03 DIAGNOSIS — M899 Disorder of bone, unspecified: Secondary | ICD-10-CM | POA: Diagnosis not present

## 2012-07-06 ENCOUNTER — Telehealth: Payer: Self-pay | Admitting: Oncology

## 2012-07-06 ENCOUNTER — Other Ambulatory Visit (HOSPITAL_BASED_OUTPATIENT_CLINIC_OR_DEPARTMENT_OTHER): Payer: Medicare Other | Admitting: Lab

## 2012-07-06 ENCOUNTER — Ambulatory Visit (HOSPITAL_BASED_OUTPATIENT_CLINIC_OR_DEPARTMENT_OTHER): Payer: Medicare Other | Admitting: Oncology

## 2012-07-06 VITALS — BP 128/84 | HR 65 | Temp 97.9°F | Resp 20 | Ht 67.0 in | Wt 164.0 lb

## 2012-07-06 DIAGNOSIS — N6019 Diffuse cystic mastopathy of unspecified breast: Secondary | ICD-10-CM

## 2012-07-06 DIAGNOSIS — D0510 Intraductal carcinoma in situ of unspecified breast: Secondary | ICD-10-CM

## 2012-07-06 DIAGNOSIS — D0590 Unspecified type of carcinoma in situ of unspecified breast: Secondary | ICD-10-CM | POA: Diagnosis not present

## 2012-07-06 DIAGNOSIS — N6089 Other benign mammary dysplasias of unspecified breast: Secondary | ICD-10-CM | POA: Diagnosis not present

## 2012-07-06 DIAGNOSIS — C50919 Malignant neoplasm of unspecified site of unspecified female breast: Secondary | ICD-10-CM

## 2012-07-06 DIAGNOSIS — N6099 Unspecified benign mammary dysplasia of unspecified breast: Secondary | ICD-10-CM

## 2012-07-06 LAB — CBC WITH DIFFERENTIAL/PLATELET
EOS%: 4.9 % (ref 0.0–7.0)
Eosinophils Absolute: 0.3 10*3/uL (ref 0.0–0.5)
LYMPH%: 20.4 % (ref 14.0–49.7)
MCH: 32.3 pg (ref 25.1–34.0)
MCHC: 33.4 g/dL (ref 31.5–36.0)
MCV: 96.5 fL (ref 79.5–101.0)
MONO%: 13 % (ref 0.0–14.0)
NEUT#: 3.2 10*3/uL (ref 1.5–6.5)
Platelets: 217 10*3/uL (ref 145–400)
RBC: 4.01 10*6/uL (ref 3.70–5.45)
RDW: 13.9 % (ref 11.2–14.5)

## 2012-07-06 LAB — COMPREHENSIVE METABOLIC PANEL (CC13)
AST: 23 U/L (ref 5–34)
Alkaline Phosphatase: 40 U/L (ref 40–150)
Glucose: 89 mg/dl (ref 70–99)
Sodium: 138 mEq/L (ref 136–145)
Total Bilirubin: 0.4 mg/dL (ref 0.20–1.20)
Total Protein: 6.9 g/dL (ref 6.4–8.3)

## 2012-07-06 MED ORDER — TAMOXIFEN CITRATE 20 MG PO TABS: 20.0000 mg | ORAL_TABLET | Freq: Every day | ORAL | Status: DC

## 2012-07-06 NOTE — Telephone Encounter (Signed)
gv pt appt schedule for 02/08/2013.

## 2012-07-06 NOTE — Progress Notes (Signed)
OFFICE PROGRESS NOTE  CC  MCNEILL,WENDY, MD [address] [place] [address] Dr. Lurline Hare Dr. 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 The final pathology revealed atypical ductal hyperplasia. Otherwise there were noted to be fibrocystic disease on the final lumpectomy specimen.  #2 in the right breast patient was found to have ductal carcinoma in situ stage 0  #3 patient completed radiation therapy to the right breast  #4 she was then begun on tamoxifen 20 mg daily starting in August 2011.  CURRENT THERAPY: Tamoxifen 20 mg daily since August 2011  INTERVAL HISTORY: Morgan Elliott 66 y.o. female returns for Followup visit today. She seems to be doing well she is tolerating tamoxifen well except for some hot flashes she has not had any vaginal bleeding. She tells me she has not had any recent biopsies performed of her breast she has not noticed any masses lumps or bumps. She has no nausea vomiting fevers chills night sweats no vaginal bleeding no aches or pains. Remainder of the tampon review of systems is negative. MEDICAL HISTORY: Past Medical History  Diagnosis Date  . 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 Breast cancer   . Arthritis     ALLERGIES:   has no known allergies.  MEDICATIONS:  Current Outpatient Prescriptions  Medication Sig Dispense Refill  . amLODipine-benazepril (LOTREL) 5-10 MG per capsule Take 1 capsule by mouth daily.      . calcium carbonate (OS-CAL) 600 MG TABS Take 600 mg by mouth 2 (two) times daily with a meal.      . cetirizine (ZYRTEC) 10 MG tablet Take 10 mg by mouth daily.      . cholecalciferol (VITAMIN D) 400 UNITS TABS Take by mouth.       . citalopram (CELEXA) 20 MG tablet Take 20 mg by mouth daily.      Marland Kitchen co-enzyme Q-10 30 MG capsule Take 30 mg by mouth 3 (three) times daily.      . fluticasone (FLONASE) 50 MCG/ACT nasal spray Place 1 spray into the nose daily.      . Multiple Vitamins-Minerals (CENTRUM SILVER PO) Take 1 each by mouth.      . ranitidine (ZANTAC) 150 MG capsule Take 150 mg by mouth daily.      . tamoxifen (NOLVADEX) 20 MG tablet Take 20 mg by mouth daily.      . zaleplon (SONATA) 5 MG capsule Take 5 mg by mouth at bedtime.        SURGICAL HISTORY:  Past Surgical History  Procedure Date  . Breast lumpectomy 06/2010    REVIEW OF SYSTEMS:  Pertinent items are noted in HPI.   PHYSICAL EXAMINATION: General appearance: alert, cooperative and appears stated age Head: Normocephalic, without obvious abnormality, atraumatic Neck: no adenopathy, no carotid bruit, no JVD, supple, symmetrical, trachea midline and thyroid not enlarged, symmetric, no tenderness/mass/nodules Lymph nodes: Cervical, supraclavicular, and axillary nodes normal. Resp: clear to auscultation bilaterally and normal percussion bilaterally Back: symmetric, no curvature. ROM normal. No CVA tenderness. Cardio: regular rate and rhythm, S1, S2 normal, no murmur, click, rub or gallop GI: soft, non-tender; bowel sounds normal; no masses,  no organomegaly Extremities: extremities normal, atraumatic, no cyanosis or edema Neurologic:  Alert and oriented X 3, normal strength and tone. Normal symmetric reflexes. Normal coordination and gait Bilateral breast examination: Right breast reveals well-healed surgical scars there are lump lumps noted but they are normal these are not unusual for her. Left breast no masses or nipple discharge.  ECOG PERFORMANCE STATUS: 0 - Asymptomatic  Blood pressure 128/84, pulse 65, temperature 97.9 F (36.6 C), temperature source Oral, resp. rate 20, height 5\' 7"  (1.702 m), weight 164 lb (74.39 kg).  LABORATORY  DATA: Lab Results  Component Value Date   WBC 5.2 07/06/2012   HGB 12.9 07/06/2012   HCT 38.7 07/06/2012   MCV 96.5 07/06/2012   PLT 217 07/06/2012      Chemistry      Component Value Date/Time   NA 138 07/06/2012 1357   NA 136 12/09/2011 1403   K 4.3 07/06/2012 1357   K 4.4 12/09/2011 1403   CL 104 07/06/2012 1357   CL 101 12/09/2011 1403   CO2 25 07/06/2012 1357   CO2 26 12/09/2011 1403   BUN 12.0 07/06/2012 1357   BUN 17 12/09/2011 1403   CREATININE 0.9 07/06/2012 1357   CREATININE 1.05 12/09/2011 1403      Component Value Date/Time   CALCIUM 9.4 07/06/2012 1357   CALCIUM 9.4 12/09/2011 1403   ALKPHOS 40 07/06/2012 1357   ALKPHOS 38* 12/09/2011 1403   AST 23 07/06/2012 1357   AST 25 12/09/2011 1403   ALT 15 07/06/2012 1357   ALT 15 12/09/2011 1403   BILITOT 0.40 07/06/2012 1357   BILITOT 0.3 12/09/2011 1403       RADIOGRAPHIC STUDIES:  No results found.  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 Patient underwent lumpectomy followed by radiation therapy and then was begun on tamoxifen 20 mg daily. She is tolerating this very well.  #2 left atypical ductal hyperplasia she has undergone a lumpectomy of this as well and is doing well without evidence of recurrent disease.  #3 patient will continue to get her yearly diagnostic mammogram.  PLAN:   #1 patient will continue tamoxifen 20 mg daily.  #2 I will plan on seeing her back in 6 months time #3 patient knows to call with any problems questions or concerns.   All questions were answered. The patient knows to call the clinic with any problems, questions or concerns. We can certainly see the patient much sooner if necessary.  I spent 15 minutes counseling the patient face to face. The total time spent in the appointment was 30 minutes.    Morgan Second, MD Medical/Oncology [facility] [phone]  (beeper) [phone] (Office)  07/06/2012, 4:02 PM

## 2012-07-06 NOTE — Patient Instructions (Addendum)
Doing well  Continue tamoxifen  I will see you back in 6 months

## 2012-07-28 ENCOUNTER — Encounter: Payer: Self-pay | Admitting: *Deleted

## 2012-09-10 ENCOUNTER — Ambulatory Visit
Admission: RE | Admit: 2012-09-10 | Discharge: 2012-09-10 | Disposition: A | Discharge status: Home / Self Care | Payer: Medicare Other | Source: Ambulatory Visit | Attending: Radiation Oncology | Admitting: Radiation Oncology

## 2012-09-10 ENCOUNTER — Ambulatory Visit: Payer: BLUE CROSS/BLUE SHIELD | Admitting: Radiation Oncology

## 2012-09-10 VITALS — BP 138/82 | HR 76 | Temp 97.8°F | Wt 163.6 lb

## 2012-09-10 DIAGNOSIS — D0510 Intraductal carcinoma in situ of unspecified breast: Secondary | ICD-10-CM

## 2012-09-10 NOTE — Progress Notes (Signed)
Patient here for routine follow up completion of right breast radiation July 2011.Denies pain.Takes mobic for arthritis and degenerative disc disease otherwise doing well.Mammogram August 2013 negative for malignancy.

## 2012-09-10 NOTE — Progress Notes (Signed)
   Department of Radiation Oncology  Phone:  [phone] Fax:        [phone]   Name: Morgan Elliott   DOB: 06-05-1946  MRN: [id number]    Date: 09/10/2012  Follow Up Visit Note  Diagnosis: DCIS of the right breast  Interval since last radiation: 2 years  Interval History: Morgan Elliott presents today for routine followup.  She is feeling well and doing well. She continues to have lower extremity edema but is using compression stockings for this. She is tolerating her tamoxifen well. She had negative mammograms in March. She has no breast related complaints.  Allergies: No Known Allergies  Medications:  Current Outpatient Prescriptions  Medication Sig Dispense Refill  . amLODipine-benazepril (LOTREL) 5-10 MG per capsule Take 1 capsule by mouth daily.      . calcium carbonate (OS-CAL) 600 MG TABS Take 600 mg by mouth 2 (two) times daily with a meal.      . cetirizine (ZYRTEC) 10 MG tablet Take 10 mg by mouth daily.      . cholecalciferol (VITAMIN D) 400 UNITS TABS Take by mouth.      . citalopram (CELEXA) 20 MG tablet Take 20 mg by mouth daily.      Marland Kitchen co-enzyme Q-10 30 MG capsule Take 30 mg by mouth 3 (three) times daily.      . fluticasone (FLONASE) 50 MCG/ACT nasal spray Place 1 spray into the nose daily.      . meloxicam (MOBIC) 15 MG tablet Take 15 mg by mouth daily.      . Multiple Vitamins-Minerals (CENTRUM SILVER PO) Take 1 each by mouth.      . ranitidine (ZANTAC) 150 MG capsule Take 150 mg by mouth daily.      . tamoxifen (NOLVADEX) 20 MG tablet Take 1 tablet (20 mg total) by mouth daily.  90 tablet  12  . zaleplon (SONATA) 5 MG capsule Take 5 mg by mouth at bedtime.        Physical Exam:   weight is 163 lb 9.6 oz (74.208 kg). Her temperature is 97.8 F (36.6 C). Her blood pressure is 138/82 and her pulse is 76.  She is a pleasant female in no distress sitting comfortably examining table. Her strength is 5 out of 5. She has no palpable cervical or supraclavicular  adenopathy. No palpable axillary adenopathy bilaterally. No palpable abnormalities between the left and right breast.  IMPRESSION: Morgan Elliott is a 66 y.o. female status post breast conservation for DCIS. She is now 2 years out from treatment  PLAN:  Marcos looks great. I have discharged her from followup. She will be following with Dr. Welton Flakes. She knows she can contact me with any questions or concerns.    Lurline Hare, MD

## 2012-11-16 DIAGNOSIS — M76899 Other specified enthesopathies of unspecified lower limb, excluding foot: Secondary | ICD-10-CM | POA: Diagnosis not present

## 2012-11-23 ENCOUNTER — Ambulatory Visit: Payer: Medicare Other | Attending: Family Medicine | Admitting: Physical Therapy

## 2012-11-23 DIAGNOSIS — M25659 Stiffness of unspecified hip, not elsewhere classified: Secondary | ICD-10-CM | POA: Diagnosis not present

## 2012-11-23 DIAGNOSIS — M25569 Pain in unspecified knee: Secondary | ICD-10-CM | POA: Diagnosis not present

## 2012-11-23 DIAGNOSIS — IMO0001 Reserved for inherently not codable concepts without codable children: Secondary | ICD-10-CM | POA: Insufficient documentation

## 2012-11-23 DIAGNOSIS — M25559 Pain in unspecified hip: Secondary | ICD-10-CM | POA: Insufficient documentation

## 2012-11-25 ENCOUNTER — Ambulatory Visit: Payer: Medicare Other | Admitting: Physical Therapy

## 2012-11-30 ENCOUNTER — Ambulatory Visit: Payer: Medicare Other | Admitting: Physical Therapy

## 2012-12-02 ENCOUNTER — Ambulatory Visit: Payer: Medicare Other | Admitting: Physical Therapy

## 2012-12-07 ENCOUNTER — Ambulatory Visit: Payer: Medicare Other | Admitting: Physical Therapy

## 2012-12-07 DIAGNOSIS — Z124 Encounter for screening for malignant neoplasm of cervix: Secondary | ICD-10-CM | POA: Diagnosis not present

## 2012-12-07 DIAGNOSIS — Z01419 Encounter for gynecological examination (general) (routine) without abnormal findings: Secondary | ICD-10-CM | POA: Diagnosis not present

## 2012-12-09 ENCOUNTER — Ambulatory Visit: Payer: Medicare Other | Admitting: Physical Therapy

## 2012-12-14 ENCOUNTER — Ambulatory Visit: Payer: Medicare Other | Admitting: Physical Therapy

## 2012-12-14 DIAGNOSIS — I10 Essential (primary) hypertension: Secondary | ICD-10-CM | POA: Diagnosis not present

## 2012-12-14 DIAGNOSIS — C50919 Malignant neoplasm of unspecified site of unspecified female breast: Secondary | ICD-10-CM | POA: Diagnosis not present

## 2012-12-14 DIAGNOSIS — Z79899 Other long term (current) drug therapy: Secondary | ICD-10-CM | POA: Diagnosis not present

## 2012-12-14 DIAGNOSIS — R1013 Epigastric pain: Secondary | ICD-10-CM | POA: Diagnosis not present

## 2012-12-14 DIAGNOSIS — Z23 Encounter for immunization: Secondary | ICD-10-CM | POA: Diagnosis not present

## 2012-12-14 DIAGNOSIS — F339 Major depressive disorder, recurrent, unspecified: Secondary | ICD-10-CM | POA: Diagnosis not present

## 2012-12-14 DIAGNOSIS — J309 Allergic rhinitis, unspecified: Secondary | ICD-10-CM | POA: Diagnosis not present

## 2012-12-14 DIAGNOSIS — Z0000 Encounter for general adult medical examination without abnormal findings: Secondary | ICD-10-CM | POA: Diagnosis not present

## 2012-12-14 DIAGNOSIS — K3189 Other diseases of stomach and duodenum: Secondary | ICD-10-CM | POA: Diagnosis not present

## 2012-12-16 ENCOUNTER — Encounter: Payer: Medicare Other | Admitting: Physical Therapy

## 2012-12-17 ENCOUNTER — Encounter: Payer: Medicare Other | Admitting: Physical Therapy

## 2012-12-18 DIAGNOSIS — M899 Disorder of bone, unspecified: Secondary | ICD-10-CM | POA: Diagnosis not present

## 2012-12-18 DIAGNOSIS — F339 Major depressive disorder, recurrent, unspecified: Secondary | ICD-10-CM | POA: Diagnosis not present

## 2012-12-18 DIAGNOSIS — R05 Cough: Secondary | ICD-10-CM | POA: Diagnosis not present

## 2012-12-18 DIAGNOSIS — R197 Diarrhea, unspecified: Secondary | ICD-10-CM | POA: Diagnosis not present

## 2012-12-18 DIAGNOSIS — J309 Allergic rhinitis, unspecified: Secondary | ICD-10-CM | POA: Diagnosis not present

## 2012-12-18 DIAGNOSIS — Z0000 Encounter for general adult medical examination without abnormal findings: Secondary | ICD-10-CM | POA: Diagnosis not present

## 2012-12-18 DIAGNOSIS — M1990 Unspecified osteoarthritis, unspecified site: Secondary | ICD-10-CM | POA: Diagnosis not present

## 2012-12-18 DIAGNOSIS — I10 Essential (primary) hypertension: Secondary | ICD-10-CM | POA: Diagnosis not present

## 2012-12-21 ENCOUNTER — Ambulatory Visit: Payer: Medicare Other | Admitting: Physical Therapy

## 2012-12-23 ENCOUNTER — Encounter: Payer: Medicare Other | Admitting: Physical Therapy

## 2012-12-28 ENCOUNTER — Encounter: Payer: Medicare Other | Admitting: Physical Therapy

## 2012-12-30 ENCOUNTER — Encounter: Payer: Medicare Other | Admitting: Physical Therapy

## 2013-01-06 DIAGNOSIS — R05 Cough: Secondary | ICD-10-CM | POA: Diagnosis not present

## 2013-01-06 DIAGNOSIS — I10 Essential (primary) hypertension: Secondary | ICD-10-CM | POA: Diagnosis not present

## 2013-01-19 DIAGNOSIS — J370 Chronic laryngitis: Secondary | ICD-10-CM | POA: Diagnosis not present

## 2013-01-19 DIAGNOSIS — J309 Allergic rhinitis, unspecified: Secondary | ICD-10-CM | POA: Diagnosis not present

## 2013-01-28 ENCOUNTER — Ambulatory Visit
Admission: RE | Admit: 2013-01-28 | Discharge: 2013-01-28 | Disposition: A | Discharge status: Home / Self Care | Payer: Medicare Other | Source: Ambulatory Visit | Attending: Allergy and Immunology | Admitting: Allergy and Immunology

## 2013-01-28 ENCOUNTER — Other Ambulatory Visit: Payer: Self-pay | Admitting: Allergy and Immunology

## 2013-01-28 DIAGNOSIS — R05 Cough: Secondary | ICD-10-CM

## 2013-01-28 DIAGNOSIS — J449 Chronic obstructive pulmonary disease, unspecified: Secondary | ICD-10-CM | POA: Diagnosis not present

## 2013-02-08 ENCOUNTER — Telehealth: Payer: Self-pay | Admitting: Oncology

## 2013-02-08 ENCOUNTER — Ambulatory Visit (HOSPITAL_BASED_OUTPATIENT_CLINIC_OR_DEPARTMENT_OTHER): Payer: Medicare Other | Admitting: Oncology

## 2013-02-08 ENCOUNTER — Other Ambulatory Visit (HOSPITAL_BASED_OUTPATIENT_CLINIC_OR_DEPARTMENT_OTHER): Payer: Medicare Other | Admitting: Lab

## 2013-02-08 ENCOUNTER — Encounter: Payer: Self-pay | Admitting: Oncology

## 2013-02-08 VITALS — BP 128/81 | HR 85 | Temp 98.4°F | Resp 20 | Ht 67.0 in | Wt 167.4 lb

## 2013-02-08 DIAGNOSIS — D0590 Unspecified type of carcinoma in situ of unspecified breast: Secondary | ICD-10-CM

## 2013-02-08 DIAGNOSIS — C50919 Malignant neoplasm of unspecified site of unspecified female breast: Secondary | ICD-10-CM

## 2013-02-08 DIAGNOSIS — D0510 Intraductal carcinoma in situ of unspecified breast: Secondary | ICD-10-CM

## 2013-02-08 DIAGNOSIS — N6089 Other benign mammary dysplasias of unspecified breast: Secondary | ICD-10-CM | POA: Diagnosis not present

## 2013-02-08 DIAGNOSIS — N6099 Unspecified benign mammary dysplasia of unspecified breast: Secondary | ICD-10-CM

## 2013-02-08 LAB — COMPREHENSIVE METABOLIC PANEL (CC13)
Alkaline Phosphatase: 46 U/L (ref 40–150)
BUN: 18.2 mg/dL (ref 7.0–26.0)
Glucose: 98 mg/dl (ref 70–99)
Total Bilirubin: 0.36 mg/dL (ref 0.20–1.20)

## 2013-02-08 LAB — CBC WITH DIFFERENTIAL/PLATELET
Basophils Absolute: 0 10*3/uL (ref 0.0–0.1)
Eosinophils Absolute: 0.1 10*3/uL (ref 0.0–0.5)
HGB: 12.9 g/dL (ref 11.6–15.9)
LYMPH%: 16.4 % (ref 14.0–49.7)
MCH: 32 pg (ref 25.1–34.0)
MCV: 95.6 fL (ref 79.5–101.0)
MONO%: 11.6 % (ref 0.0–14.0)
NEUT#: 4.4 10*3/uL (ref 1.5–6.5)
Platelets: 228 10*3/uL (ref 145–400)
RBC: 4.05 10*6/uL (ref 3.70–5.45)

## 2013-02-08 NOTE — Telephone Encounter (Signed)
, °

## 2013-02-08 NOTE — Patient Instructions (Addendum)
Continue tamoxifen 20 mg  I will see you back in 1 year

## 2013-02-08 NOTE — Progress Notes (Signed)
OFFICE PROGRESS NOTE  CC  MCNEILL,WENDY, MD [address] [place] [address] Dr. Lurline Hare Dr. 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 The final pathology revealed atypical ductal hyperplasia. Otherwise there were noted to be fibrocystic disease on the final lumpectomy specimen.  #2 in the right breast patient was found to have ductal carcinoma in situ stage 0  #3 patient completed radiation therapy to the right breast  #4 she was then begun on tamoxifen 20 mg daily starting in August 2011.  CURRENT THERAPY: Tamoxifen 20 mg daily since August 2011  INTERVAL HISTORY: Morgan Elliott 67 y.o. female returns for Followup visit today. She seems to be doing well she is tolerating tamoxifen well except for some hot flashes she has not had any vaginal bleeding. She tells me she has not had any recent biopsies performed of her breast she has not noticed any masses lumps or bumps. She has no nausea vomiting fevers chills night sweats no vaginal bleeding no aches or pains. Remainder of the tampon review of systems is negative. MEDICAL HISTORY: Past Medical History  Diagnosis Date  . 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 Breast cancer   . Arthritis     ALLERGIES:  has No Known Allergies.  MEDICATIONS:  Current Outpatient Prescriptions  Medication Sig Dispense Refill  . amLODipine (NORVASC) 5 MG tablet Take 5 mg by mouth daily.      . calcium carbonate (OS-CAL) 600 MG TABS Take 600 mg by mouth 2 (two) times daily with a meal.      . celecoxib (CELEBREX) 200 MG capsule Take 200 mg by mouth daily.      . citalopram (CELEXA) 20 MG tablet Take 20 mg by mouth daily.      Marland Kitchen co-enzyme  Q-10 30 MG capsule Take 30 mg by mouth 3 (three) times daily.      Marland Kitchen dexlansoprazole (DEXILANT) 60 MG capsule Take 60 mg by mouth daily.      . hydrochlorothiazide (MICROZIDE) 12.5 MG capsule Take 12.5 mg by mouth daily. One half to one tablet daily      . losartan (COZAAR) 50 MG tablet Take 50 mg by mouth daily. Half a tablet daily      . mometasone (NASONEX) 50 MCG/ACT nasal spray Place 2 sprays into the nose daily.      . montelukast (SINGULAIR) 10 MG tablet Take 10 mg by mouth at bedtime.      . Multiple Vitamins-Minerals (CENTRUM SILVER PO) Take 1 each by mouth.      . ranitidine (ZANTAC) 150 MG capsule Take 150 mg by mouth daily.      . tamoxifen (NOLVADEX) 20 MG tablet Take 1 tablet (20 mg total) by mouth daily.  90 tablet  12  . zaleplon (SONATA) 5 MG capsule Take 5 mg by mouth at bedtime.      Marland Kitchen amLODipine-benazepril (LOTREL) 5-10 MG per capsule Take 1 capsule by mouth daily.      . cholecalciferol (VITAMIN D) 400 UNITS TABS Take by mouth.       No current facility-administered medications for this visit.    SURGICAL HISTORY:  Past Surgical History  Procedure Laterality Date  . Breast lumpectomy  06/2010  REVIEW OF SYSTEMS:  Pertinent items are noted in HPI.   PHYSICAL EXAMINATION: General appearance: alert, cooperative and appears stated age Head: Normocephalic, without obvious abnormality, atraumatic Neck: no adenopathy, no carotid bruit, no JVD, supple, symmetrical, trachea midline and thyroid not enlarged, symmetric, no tenderness/mass/nodules Lymph nodes: Cervical, supraclavicular, and axillary nodes normal. Resp: clear to auscultation bilaterally and normal percussion bilaterally Back: symmetric, no curvature. ROM normal. No CVA tenderness. Cardio: regular rate and rhythm, S1, S2 normal, no murmur, click, rub or gallop GI: soft, non-tender; bowel sounds normal; no masses,  no organomegaly Extremities: extremities normal, atraumatic, no cyanosis or edema Neurologic:  Alert and oriented X 3, normal strength and tone. Normal symmetric reflexes. Normal coordination and gait Bilateral breast examination: Right breast reveals well-healed surgical scars there are lump lumps noted but they are normal these are not unusual for her. Left breast no masses or nipple discharge.  ECOG PERFORMANCE STATUS: 0 - Asymptomatic  Blood pressure 128/81, pulse 85, temperature 98.4 F (36.9 C), temperature source Oral, resp. rate 20, height 5\' 7"  (1.702 m), weight 167 lb 6.4 oz (75.932 kg).  LABORATORY DATA: Lab Results  Component Value Date   WBC 6.2 02/08/2013   HGB 12.9 02/08/2013   HCT 38.7 02/08/2013   MCV 95.6 02/08/2013   PLT 228 02/08/2013      Chemistry      Component Value Date/Time   NA 135* 02/08/2013 1346   NA 136 12/09/2011 1403   K 3.8 02/08/2013 1346   K 4.4 12/09/2011 1403   CL 100 02/08/2013 1346   CL 101 12/09/2011 1403   CO2 26 02/08/2013 1346   CO2 26 12/09/2011 1403   BUN 18.2 02/08/2013 1346   BUN 17 12/09/2011 1403   CREATININE 1.0 02/08/2013 1346   CREATININE 1.05 12/09/2011 1403      Component Value Date/Time   CALCIUM 9.1 02/08/2013 1346   CALCIUM 9.4 12/09/2011 1403   ALKPHOS 46 02/08/2013 1346   ALKPHOS 38* 12/09/2011 1403   AST 21 02/08/2013 1346   AST 25 12/09/2011 1403   ALT 14 02/08/2013 1346   ALT 15 12/09/2011 1403   BILITOT 0.36 02/08/2013 1346   BILITOT 0.3 12/09/2011 1403       RADIOGRAPHIC STUDIES:  No results found.  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 Patient underwent lumpectomy followed by radiation therapy and then was begun on tamoxifen 20 mg daily. She is tolerating this very well.  #2 left atypical ductal hyperplasia she has undergone a lumpectomy of this as well and is doing well without evidence of recurrent disease.  #3 patient will continue to get her yearly diagnostic mammogram.  PLAN:   #1 patient will  continue tamoxifen 20 mg daily.  #2 I will plan on seeing her back in 12 months time #3 patient knows to call with any problems questions or concerns.   All questions were answered. The patient knows to call the clinic with any problems, questions or concerns. We can certainly see the patient much sooner if necessary.  I spent 15 minutes counseling the patient face to face. The total time spent in the appointment was 30 minutes.    Drue Second, MD Medical/Oncology [facility] [phone] (beeper) [phone] (Office)  02/08/2013, 3:02 PM

## 2013-02-09 DIAGNOSIS — J370 Chronic laryngitis: Secondary | ICD-10-CM | POA: Diagnosis not present

## 2013-03-02 ENCOUNTER — Other Ambulatory Visit: Payer: Self-pay | Admitting: Obstetrics and Gynecology

## 2013-03-02 NOTE — Telephone Encounter (Signed)
Last annual 12/07/12 Last given 07/08/12 #20 1 Rf

## 2013-03-02 NOTE — Telephone Encounter (Signed)
Fine to refill the Sonata through her next [facility].

## 2013-03-03 NOTE — Telephone Encounter (Signed)
Belinda,  Why did I get this back

## 2013-03-10 DIAGNOSIS — L820 Inflamed seborrheic keratosis: Secondary | ICD-10-CM | POA: Diagnosis not present

## 2013-03-10 DIAGNOSIS — D239 Other benign neoplasm of skin, unspecified: Secondary | ICD-10-CM | POA: Diagnosis not present

## 2013-03-10 DIAGNOSIS — Z85828 Personal history of other malignant neoplasm of skin: Secondary | ICD-10-CM | POA: Diagnosis not present

## 2013-03-10 DIAGNOSIS — D1801 Hemangioma of skin and subcutaneous tissue: Secondary | ICD-10-CM | POA: Diagnosis not present

## 2013-03-10 DIAGNOSIS — L819 Disorder of pigmentation, unspecified: Secondary | ICD-10-CM | POA: Diagnosis not present

## 2013-03-10 DIAGNOSIS — L821 Other seborrheic keratosis: Secondary | ICD-10-CM | POA: Diagnosis not present

## 2013-03-10 DIAGNOSIS — L919 Hypertrophic disorder of the skin, unspecified: Secondary | ICD-10-CM | POA: Diagnosis not present

## 2013-03-10 DIAGNOSIS — L909 Atrophic disorder of skin, unspecified: Secondary | ICD-10-CM | POA: Diagnosis not present

## 2013-04-15 DIAGNOSIS — H43819 Vitreous degeneration, unspecified eye: Secondary | ICD-10-CM | POA: Diagnosis not present

## 2013-05-04 DIAGNOSIS — J309 Allergic rhinitis, unspecified: Secondary | ICD-10-CM | POA: Diagnosis not present

## 2013-05-04 DIAGNOSIS — J370 Chronic laryngitis: Secondary | ICD-10-CM | POA: Diagnosis not present

## 2013-05-05 ENCOUNTER — Other Ambulatory Visit: Payer: Self-pay | Admitting: Family Medicine

## 2013-05-05 DIAGNOSIS — Z853 Personal history of malignant neoplasm of breast: Secondary | ICD-10-CM

## 2013-05-05 DIAGNOSIS — Z9889 Other specified postprocedural states: Secondary | ICD-10-CM

## 2013-05-26 ENCOUNTER — Other Ambulatory Visit: Payer: Self-pay

## 2013-06-02 DIAGNOSIS — M76899 Other specified enthesopathies of unspecified lower limb, excluding foot: Secondary | ICD-10-CM | POA: Diagnosis not present

## 2013-06-04 ENCOUNTER — Other Ambulatory Visit: Payer: Self-pay | Admitting: Family Medicine

## 2013-06-04 ENCOUNTER — Ambulatory Visit
Admission: RE | Admit: 2013-06-04 | Discharge: 2013-06-04 | Disposition: A | Discharge status: Home / Self Care | Payer: Medicare Other | Source: Ambulatory Visit | Attending: Family Medicine | Admitting: Family Medicine

## 2013-06-04 DIAGNOSIS — Z853 Personal history of malignant neoplasm of breast: Secondary | ICD-10-CM

## 2013-06-04 DIAGNOSIS — N6459 Other signs and symptoms in breast: Secondary | ICD-10-CM | POA: Diagnosis not present

## 2013-06-04 DIAGNOSIS — Z9889 Other specified postprocedural states: Secondary | ICD-10-CM

## 2013-06-10 ENCOUNTER — Other Ambulatory Visit: Payer: Self-pay

## 2013-06-10 DIAGNOSIS — D0590 Unspecified type of carcinoma in situ of unspecified breast: Secondary | ICD-10-CM

## 2013-06-10 MED ORDER — TAMOXIFEN CITRATE 20 MG PO TABS: 20.0000 mg | ORAL_TABLET | Freq: Every day | ORAL | Status: DC

## 2013-06-10 NOTE — Telephone Encounter (Signed)
S/w pt about getting tamoxifen refilled [facility].

## 2013-06-23 DIAGNOSIS — M1710 Unilateral primary osteoarthritis, unspecified knee: Secondary | ICD-10-CM | POA: Diagnosis not present

## 2013-07-09 DIAGNOSIS — M1710 Unilateral primary osteoarthritis, unspecified knee: Secondary | ICD-10-CM | POA: Diagnosis not present

## 2013-07-14 DIAGNOSIS — M1710 Unilateral primary osteoarthritis, unspecified knee: Secondary | ICD-10-CM | POA: Diagnosis not present

## 2013-07-14 DIAGNOSIS — M7120 Synovial cyst of popliteal space [Baker], unspecified knee: Secondary | ICD-10-CM | POA: Diagnosis not present

## 2013-07-16 ENCOUNTER — Encounter: Payer: Self-pay | Admitting: Obstetrics & Gynecology

## 2013-08-10 DIAGNOSIS — Z23 Encounter for immunization: Secondary | ICD-10-CM | POA: Diagnosis not present

## 2013-08-26 ENCOUNTER — Other Ambulatory Visit: Payer: Self-pay

## 2013-09-22 ENCOUNTER — Telehealth: Payer: Self-pay | Admitting: Obstetrics & Gynecology

## 2013-09-22 NOTE — Telephone Encounter (Signed)
Dr. Hyacinth Meeker,  Routing to you because patient has annual scheduled with you. Previous patient of Dr. Tresa Res. Last AEX with Dr. Tresa Res 12/07/12. On AEX Dr. Tresa Res wrote "Patient likes Sonata-doesn't need new rx-no grogginess, will call if wants Sonata".  Prior refills have expired.  Do you want to see patient first?

## 2013-09-22 NOTE — Telephone Encounter (Signed)
Patient is calling to see why her request for the prescription below was denied. zaleplon (SONATA) 5 MG capsule  TAKE ONE CAPSULE AT BEDTIME AS NEEDED FOR INSOMNIA, Print, Last Dose: Not Recorded  Refills: 5 ordered Pharmacy: [id number] [place], [place] - [address]

## 2013-09-23 NOTE — Telephone Encounter (Signed)
No appt needed but this will need to be called in on Friday since I won't be in office.  Thanks.

## 2013-09-24 MED ORDER — ZALEPLON 5 MG PO CAPS: 5.0000 mg | ORAL_CAPSULE | Freq: Every evening | ORAL | Status: DC | PRN

## 2013-09-24 NOTE — Telephone Encounter (Signed)
Order as the same as prior? Sonata 5 mg 1 capsule at bedtime prn insomnia. #20 5 refills.

## 2013-09-24 NOTE — Telephone Encounter (Signed)
Yes please

## 2013-09-24 NOTE — Telephone Encounter (Signed)
Rx called in, patient aware.  

## 2013-11-15 ENCOUNTER — Encounter: Payer: Self-pay | Admitting: Oncology

## 2013-11-16 ENCOUNTER — Telehealth: Payer: Self-pay | Admitting: *Deleted

## 2013-11-16 ENCOUNTER — Telehealth: Payer: Self-pay | Admitting: Obstetrics & Gynecology

## 2013-11-16 NOTE — Telephone Encounter (Signed)
Pt called back to change appt time as she has to take husband to the airport that afternoon. Scheduled appt 11-19-13 at 10 am as that would allow more time in case procedure required. Pt agreeable.

## 2013-11-16 NOTE — Telephone Encounter (Signed)
Patient is having irregular bleeding and wants an appointment.

## 2013-11-16 NOTE — Telephone Encounter (Signed)
Spoke with pt who has been having some vaginal bleeding that comes and goes, "minor bleeding." Pt has been under care of oncologist for breast cancer and is on Tamoxifen. Her oncologist suggested she see her GYN since her last episode of bleeding was more prolonged than previous ones. Pt mentioned that she had an endo bx years ago and it was a bad experience. Pt would like to take something to calm her if she needs this done again, and can have her husband drive her. Offered several appt times to pt, and she requested Friday 11-19-13 at 2:30.   Dr. Sabra Heck, do you want to move appt sooner?

## 2013-11-16 NOTE — Telephone Encounter (Signed)
Pt had sent an email stating she had recent episodes of minot vaginal bleeding and cramping. Pt wanted to know if she should see Dr Humphrey Rolls or OB-GYN. Encouraged pt to call OB-GYN to schedule an appt/exam.

## 2013-11-16 NOTE — Telephone Encounter (Signed)
Thank you. Encounter closed. 

## 2013-11-18 ENCOUNTER — Encounter: Payer: Self-pay | Admitting: Obstetrics & Gynecology

## 2013-11-19 ENCOUNTER — Ambulatory Visit (INDEPENDENT_AMBULATORY_CARE_PROVIDER_SITE_OTHER): Payer: Medicare Other | Admitting: Obstetrics & Gynecology

## 2013-11-19 VITALS — BP 118/72 | HR 78 | Resp 20 | Ht 66.0 in | Wt 164.8 lb

## 2013-11-19 DIAGNOSIS — N950 Postmenopausal bleeding: Secondary | ICD-10-CM | POA: Diagnosis not present

## 2013-11-19 DIAGNOSIS — D261 Other benign neoplasm of corpus uteri: Secondary | ICD-10-CM | POA: Diagnosis not present

## 2013-11-19 NOTE — Patient Instructions (Signed)

## 2013-11-19 NOTE — Progress Notes (Signed)
Endometrial Biopsy Procedure Note  Indications: postmenopausal bleeding, tamoxifen use.  H/O DCIS 2011 on Tamoxifen about 3 1/2 years at this point   Pre-operative Diagnosis: PMP bleeding  Post-operative Diagnosis: PMP bleeding   Procedure Details   The risks (including infection, bleeding, pain, and uterine perforation) and benefits of the procedure were explained to the patient and written informed consent was obtained.    The patient was placed in the dorsal lithotomy position.  Bimanual exam was performed to assess uterine size and position.  A  speculum inserted in the vagina, and the cervix prepped with povidone iodine.  A sharp tenaculum  was  applied to the cervix.  Dilation of the cervix was not  necessary.    A pipelle was used to sample the endometrium.  The uterus sounded to  7 cm.   Two passes performed.  Sample was sent for pathologic examination.  Condition: Stable  Complications: None  Plan:  The patient was advised to call for any fever or for prolonged or severe pain or bleeding. She was advised to use OTC pain relievers as needed for mild to moderate pain. She was advised to avoid vaginal intercourse for 48 hours or until the bleeding has completely stopped.  An after visit summary was provided to the patient.

## 2013-11-23 ENCOUNTER — Telehealth: Payer: Self-pay | Admitting: *Deleted

## 2013-11-23 DIAGNOSIS — N950 Postmenopausal bleeding: Secondary | ICD-10-CM

## 2013-11-23 NOTE — Telephone Encounter (Signed)
Since still bleeding and no different, can you see if can schedule for Tues?  SHGM.  Thanks.

## 2013-11-23 NOTE — Telephone Encounter (Signed)
Message copied by Jaymes Graff on Tue Nov 23, 2013 12:34 PM ------      Message from: Megan Salon      Created: Tue Nov 23, 2013  6:39 AM       Inform biopsy negative for any abnormal cells.  How is her bleeding?  If stopped or minimal, ok to watch.  I don't think we need an ultrasound now but will do it if she has future bleeding.  Please encourage her to call if there is bleeding. ------

## 2013-11-23 NOTE — Telephone Encounter (Signed)
Patient notified of biopsy results and instructions as directed by Dr Sabra Heck.  Patient states she is still bleeding.  No better but no worse.  Advised to monitor/observe for now but if persists more than another week, to call back and can proceed with ultrasound. Patient agreeable.  Routed back to you to notify of update on bleeding.

## 2013-11-30 NOTE — Telephone Encounter (Signed)
Thurs 11:30 if possible.

## 2013-11-30 NOTE — Telephone Encounter (Signed)
Okay to schedule [facility] for 2/12 Thursday at 1130 and 1200 consult with you? Otherwise can do Tuesday at 1:00 with consult at 1:30?

## 2013-11-30 NOTE — Telephone Encounter (Signed)
Patient is still bleeding and was told to call after a week. Patient is ready to schedule an ultrasound.

## 2013-12-01 NOTE — Telephone Encounter (Signed)
Spoke with patient and scheduled [facility] for 1130 with Dr. Sabra Heck on 2/12. [facility] scheduled and patient aware/agreeable to time.  Patient verbalized understanding of the [facility] appointment cancellation policy. Advised will need to cancel within 72 business hours (3 business days) or will have no show fee placed to account, $150.00 for [facility].  Pre procedure instructions given.  Motrin instructions given. Motrin=Advil=Ibuprofen, 800 mg one hour before appointment. Eat a meal and hydrate well before appointment.

## 2013-12-02 ENCOUNTER — Ambulatory Visit (INDEPENDENT_AMBULATORY_CARE_PROVIDER_SITE_OTHER): Payer: Medicare Other | Admitting: Obstetrics & Gynecology

## 2013-12-02 ENCOUNTER — Ambulatory Visit (INDEPENDENT_AMBULATORY_CARE_PROVIDER_SITE_OTHER): Payer: Medicare Other

## 2013-12-02 ENCOUNTER — Other Ambulatory Visit: Payer: Self-pay | Admitting: Obstetrics & Gynecology

## 2013-12-02 DIAGNOSIS — D219 Benign neoplasm of connective and other soft tissue, unspecified: Secondary | ICD-10-CM

## 2013-12-02 DIAGNOSIS — N840 Polyp of corpus uteri: Secondary | ICD-10-CM | POA: Diagnosis not present

## 2013-12-02 DIAGNOSIS — N950 Postmenopausal bleeding: Secondary | ICD-10-CM

## 2013-12-02 DIAGNOSIS — D259 Leiomyoma of uterus, unspecified: Secondary | ICD-10-CM

## 2013-12-02 NOTE — Progress Notes (Signed)
68 y.o.Marriedfemale here for a pelvic ultrasound with sonohystogram.  Patient having some intermittent PMP bleeding.  H/O tamoxifen use.  Endometrial biopsy 1/30 was negative for any abnormal cells but didn't really explain the bleeding.  She is here for [facility] for that reason.  Indication: PMP bleeding  Patient's last menstrual period was 10/21/2001.  Technique:  Both transabdominal and transvaginal ultrasound examinations of the pelvis were performed. Transabdominal technique was performed for global imaging of the pelvis including uterus,  ovaries, adnexal regions, and pelvic cul-de-sac.  It was necessary to proceed with endovaginal exam following the abdominal ultrasound transabdominal exam to visualize the endometrium and adnexa.  Color and duplex Doppler ultrasound was utilized to evaluate blood flow to the ovaries.   FINDINGS: UTERUS: 7.2 x 4.5 x 4.4cm with 2.1cm fundal fibroid EMS: 81mm ADNEXA: Left ovary: 1.7 x 1.2 x 0.8cm Right ovary: 2.2 x 2.7 x 2.3 cm with 15mm cyst that is simple in appearance except for a 1mm echogenic foci.  This is stable from prior u/s CUL DE SAC: no free fluid  SHSG:  After obtaining appropriate verbal consent from patient, the cervix was visualized using a speculum, and prepped with betadine.  A tenaculum  was applied to the cervix.  Dilation of the cervix was not necessary. The catheter was passed into the uterus and sterile saline introduced, with the following findings: 70mm probable fundal polyp  Findings d/w pt.  Due to Tamoxifen use, continued bleeding and endometrial polyp.  Hysteroscopic polyp resection with D&C recommended.  Procedure discussed with patient.  Recovery and pain management discussed.  Risks discussed including but not limited to bleeding, rare risk of transfusion, infection, 1% risk of uterine perforation with risks of fluid deficit causing cardiac arrythmia, cerebral swelling and/or need to stop procedure early.  Fluid emboli and rare  risk of death discussed.  DVT/PE, rare risk of risk of bowel/bladder/ureteral/vascular injury.  Patient aware if pathology abnormal she may need additional treatment.  All questions answered.    Assessment:  68 yo WF with endometrial polyp, PMP bleeding and tamoxifen use  Plan:  Hysteroscopic polyp resection with D&C planned.  Information provided.  Will precert and schedule.  ~25 minutes spent with patient >50% of time was in face to face discussion of above.

## 2013-12-06 ENCOUNTER — Other Ambulatory Visit: Payer: Self-pay | Admitting: *Deleted

## 2013-12-06 MED ORDER — CITALOPRAM HYDROBROMIDE 20 MG PO TABS: 20.0000 mg | ORAL_TABLET | Freq: Every day | ORAL | Status: AC

## 2013-12-06 NOTE — Telephone Encounter (Signed)
Please advise if refill OK.  Thank you.

## 2013-12-08 ENCOUNTER — Encounter: Payer: Self-pay | Admitting: Obstetrics & Gynecology

## 2013-12-08 DIAGNOSIS — N840 Polyp of corpus uteri: Secondary | ICD-10-CM | POA: Insufficient documentation

## 2013-12-10 ENCOUNTER — Ambulatory Visit: Payer: Self-pay | Admitting: Obstetrics & Gynecology

## 2013-12-10 ENCOUNTER — Ambulatory Visit: Payer: Self-pay | Admitting: Obstetrics and Gynecology

## 2013-12-13 ENCOUNTER — Telehealth: Payer: Self-pay | Admitting: Obstetrics & Gynecology

## 2013-12-13 DIAGNOSIS — R05 Cough: Secondary | ICD-10-CM | POA: Diagnosis not present

## 2013-12-13 DIAGNOSIS — M949 Disorder of cartilage, unspecified: Secondary | ICD-10-CM | POA: Diagnosis not present

## 2013-12-13 DIAGNOSIS — J309 Allergic rhinitis, unspecified: Secondary | ICD-10-CM | POA: Diagnosis not present

## 2013-12-13 DIAGNOSIS — M899 Disorder of bone, unspecified: Secondary | ICD-10-CM | POA: Diagnosis not present

## 2013-12-13 DIAGNOSIS — Z1331 Encounter for screening for depression: Secondary | ICD-10-CM | POA: Diagnosis not present

## 2013-12-13 DIAGNOSIS — R197 Diarrhea, unspecified: Secondary | ICD-10-CM | POA: Diagnosis not present

## 2013-12-13 DIAGNOSIS — Z0000 Encounter for general adult medical examination without abnormal findings: Secondary | ICD-10-CM | POA: Diagnosis not present

## 2013-12-13 DIAGNOSIS — R059 Cough, unspecified: Secondary | ICD-10-CM | POA: Diagnosis not present

## 2013-12-13 DIAGNOSIS — F339 Major depressive disorder, recurrent, unspecified: Secondary | ICD-10-CM | POA: Diagnosis not present

## 2013-12-13 DIAGNOSIS — I10 Essential (primary) hypertension: Secondary | ICD-10-CM | POA: Diagnosis not present

## 2013-12-13 NOTE — Telephone Encounter (Signed)
Patient calling to schedule surgery

## 2013-12-14 NOTE — Telephone Encounter (Signed)
Return call to patient, discussed date preferences.  Patient will be out of town on 12-20-13 and week of 01-03-14. Prefers  Week of 12-27-13 if available. Will schedule and call her back.

## 2013-12-20 DIAGNOSIS — J309 Allergic rhinitis, unspecified: Secondary | ICD-10-CM | POA: Diagnosis not present

## 2013-12-20 DIAGNOSIS — N183 Chronic kidney disease, stage 3 unspecified: Secondary | ICD-10-CM | POA: Diagnosis not present

## 2013-12-20 DIAGNOSIS — M899 Disorder of bone, unspecified: Secondary | ICD-10-CM | POA: Diagnosis not present

## 2013-12-20 DIAGNOSIS — Z0000 Encounter for general adult medical examination without abnormal findings: Secondary | ICD-10-CM | POA: Diagnosis not present

## 2013-12-20 DIAGNOSIS — I10 Essential (primary) hypertension: Secondary | ICD-10-CM | POA: Diagnosis not present

## 2013-12-20 DIAGNOSIS — C50919 Malignant neoplasm of unspecified site of unspecified female breast: Secondary | ICD-10-CM | POA: Diagnosis not present

## 2013-12-20 DIAGNOSIS — F339 Major depressive disorder, recurrent, unspecified: Secondary | ICD-10-CM | POA: Diagnosis not present

## 2013-12-20 DIAGNOSIS — Z23 Encounter for immunization: Secondary | ICD-10-CM | POA: Diagnosis not present

## 2013-12-20 DIAGNOSIS — M19049 Primary osteoarthritis, unspecified hand: Secondary | ICD-10-CM | POA: Diagnosis not present

## 2013-12-28 ENCOUNTER — Encounter (HOSPITAL_COMMUNITY): Payer: Self-pay | Admitting: Pharmacist

## 2013-12-28 NOTE — Telephone Encounter (Signed)
[facility] She is supposed to be going in tomorrow

## 2013-12-28 NOTE — Telephone Encounter (Signed)
Surgery instructions were reviewed by A. Abernathy RN and instruction sheet mailed on 12-21-13.

## 2013-12-28 NOTE — Telephone Encounter (Signed)
Pre-op orders needed.

## 2013-12-29 ENCOUNTER — Other Ambulatory Visit: Payer: Self-pay | Admitting: Obstetrics & Gynecology

## 2013-12-29 ENCOUNTER — Encounter (HOSPITAL_COMMUNITY)
Admission: RE | Admit: 2013-12-29 | Discharge: 2013-12-29 | Disposition: A | Discharge status: Home / Self Care | Payer: Medicare Other | Source: Ambulatory Visit | Attending: Obstetrics & Gynecology | Admitting: Obstetrics & Gynecology

## 2013-12-29 ENCOUNTER — Encounter (HOSPITAL_COMMUNITY): Payer: Self-pay

## 2013-12-29 DIAGNOSIS — Z01812 Encounter for preprocedural laboratory examination: Secondary | ICD-10-CM | POA: Diagnosis not present

## 2013-12-29 DIAGNOSIS — Z01810 Encounter for preprocedural cardiovascular examination: Secondary | ICD-10-CM | POA: Diagnosis not present

## 2013-12-29 HISTORY — DX: Major depressive disorder, single episode, unspecified: F32.9

## 2013-12-29 HISTORY — DX: Chronic kidney disease, unspecified: N18.9

## 2013-12-29 HISTORY — DX: Anxiety disorder, unspecified: F41.9

## 2013-12-29 HISTORY — DX: Personal history of other diseases of the digestive system: Z87.19

## 2013-12-29 HISTORY — DX: Depression, unspecified: F32.A

## 2013-12-29 LAB — CBC
HCT: 38.9 % (ref 36.0–46.0)
HEMOGLOBIN: 13.5 g/dL (ref 12.0–15.0)
MCH: 32.1 pg (ref 26.0–34.0)
MCHC: 34.7 g/dL (ref 30.0–36.0)
MCV: 92.4 fL (ref 78.0–100.0)
Platelets: 254 10*3/uL (ref 150–400)
RBC: 4.21 MIL/uL (ref 3.87–5.11)
RDW: 14.4 % (ref 11.5–15.5)
WBC: 5.2 10*3/uL (ref 4.0–10.5)

## 2013-12-29 LAB — BASIC METABOLIC PANEL
BUN: 15 mg/dL (ref 6–23)
CALCIUM: 9.6 mg/dL (ref 8.4–10.5)
CO2: 28 meq/L (ref 19–32)
CREATININE: 0.92 mg/dL (ref 0.50–1.10)
Chloride: 98 mEq/L (ref 96–112)
GFR calc Af Amer: 74 mL/min — ABNORMAL LOW (ref >90)
GFR calc non Af Amer: 63 mL/min — ABNORMAL LOW (ref >90)
Glucose, Bld: 88 mg/dL (ref 70–99)
Potassium: 4.3 mEq/L (ref 3.7–5.3)
Sodium: 137 mEq/L (ref 137–147)

## 2013-12-29 NOTE — Patient Instructions (Addendum)
   Your procedure is scheduled on:  Tuesday, Mar 24  Enter through the [employer] [facility] at:  [place] up the phone at the desk and dial [phone] and inform us of your arrival.  Please call this number if you have any problems the morning of surgery: [phone]  Remember: Do not eat or drink after midnight: Monday Take these medicines the morning of surgery with a SIP OF WATER:  Amlodipine, celexa, hctz, cozaar, tamoxifen  Do not wear jewelry, make-up, or FINGER nail polish No metal in your hair or on your body. Do not wear lotions, powders, perfumes.  You may wear deodorant.  Do not bring valuables to the hospital. Contacts, dentures or bridgework may not be worn into surgery.  Patients discharged on the day of surgery will not be allowed to drive home.  Home with husband Bill cell [phone].

## 2013-12-31 DIAGNOSIS — N390 Urinary tract infection, site not specified: Secondary | ICD-10-CM | POA: Diagnosis not present

## 2013-12-31 DIAGNOSIS — R300 Dysuria: Secondary | ICD-10-CM | POA: Diagnosis not present

## 2014-01-11 ENCOUNTER — Encounter (HOSPITAL_COMMUNITY)
Admission: RE | Disposition: A | Discharge status: Home / Self Care | Payer: Self-pay | Source: Ambulatory Visit | Attending: Obstetrics & Gynecology

## 2014-01-11 ENCOUNTER — Ambulatory Visit (HOSPITAL_COMMUNITY)
Admission: RE | Admit: 2014-01-11 | Discharge: 2014-01-11 | Disposition: A | Discharge status: Home / Self Care | Payer: Medicare Other | Source: Ambulatory Visit | Attending: Obstetrics & Gynecology | Admitting: Obstetrics & Gynecology

## 2014-01-11 ENCOUNTER — Ambulatory Visit (HOSPITAL_COMMUNITY): Payer: Medicare Other | Admitting: Certified Registered Nurse Anesthetist

## 2014-01-11 ENCOUNTER — Encounter (HOSPITAL_COMMUNITY): Payer: Self-pay | Admitting: *Deleted

## 2014-01-11 ENCOUNTER — Encounter (HOSPITAL_COMMUNITY): Payer: Medicare Other | Admitting: Certified Registered Nurse Anesthetist

## 2014-01-11 DIAGNOSIS — Z853 Personal history of malignant neoplasm of breast: Secondary | ICD-10-CM | POA: Insufficient documentation

## 2014-01-11 DIAGNOSIS — N183 Chronic kidney disease, stage 3 unspecified: Secondary | ICD-10-CM | POA: Insufficient documentation

## 2014-01-11 DIAGNOSIS — N950 Postmenopausal bleeding: Secondary | ICD-10-CM | POA: Insufficient documentation

## 2014-01-11 DIAGNOSIS — M899 Disorder of bone, unspecified: Secondary | ICD-10-CM | POA: Insufficient documentation

## 2014-01-11 DIAGNOSIS — I129 Hypertensive chronic kidney disease with stage 1 through stage 4 chronic kidney disease, or unspecified chronic kidney disease: Secondary | ICD-10-CM | POA: Insufficient documentation

## 2014-01-11 DIAGNOSIS — N840 Polyp of corpus uteri: Secondary | ICD-10-CM | POA: Insufficient documentation

## 2014-01-11 DIAGNOSIS — M949 Disorder of cartilage, unspecified: Secondary | ICD-10-CM | POA: Diagnosis not present

## 2014-01-11 HISTORY — PX: DILATATION & CURRETTAGE/HYSTEROSCOPY WITH RESECTOCOPE: SHX5572

## 2014-01-11 SURGERY — DILATATION & CURETTAGE/HYSTEROSCOPY WITH RESECTOCOPE
Anesthesia: General | Site: Vagina

## 2014-01-11 MED ORDER — CEFAZOLIN SODIUM-DEXTROSE 2-3 GM-% IV SOLR
2.0000 g | INTRAVENOUS | Status: AC
  Administered 2014-01-11: 2 g via INTRAVENOUS

## 2014-01-11 MED ORDER — KETOROLAC TROMETHAMINE 30 MG/ML IJ SOLN
INTRAMUSCULAR | Status: AC
  Filled 2014-01-11: qty 1

## 2014-01-11 MED ORDER — ONDANSETRON HCL 4 MG/2ML IJ SOLN
INTRAMUSCULAR | Status: AC
  Filled 2014-01-11: qty 2

## 2014-01-11 MED ORDER — FENTANYL CITRATE 0.05 MG/ML IJ SOLN
INTRAMUSCULAR | Status: AC
  Filled 2014-01-11: qty 2

## 2014-01-11 MED ORDER — CEFAZOLIN SODIUM-DEXTROSE 2-3 GM-% IV SOLR
INTRAVENOUS | Status: AC
  Filled 2014-01-11: qty 50

## 2014-01-11 MED ORDER — FENTANYL CITRATE 0.05 MG/ML IJ SOLN
25.0000 ug | INTRAMUSCULAR | Status: DC | PRN
  Administered 2014-01-11: 50 ug via INTRAVENOUS

## 2014-01-11 MED ORDER — MEPERIDINE HCL 25 MG/ML IJ SOLN: 6.2500 mg | INTRAMUSCULAR | Status: DC | PRN

## 2014-01-11 MED ORDER — ONDANSETRON HCL 4 MG/2ML IJ SOLN: 4.0000 mg | Freq: Once | INTRAMUSCULAR | Status: DC | PRN

## 2014-01-11 MED ORDER — MIDAZOLAM HCL 2 MG/2ML IJ SOLN
INTRAMUSCULAR | Status: DC | PRN
  Administered 2014-01-11 (×2): 1 mg via INTRAVENOUS

## 2014-01-11 MED ORDER — FENTANYL CITRATE 0.05 MG/ML IJ SOLN
INTRAMUSCULAR | Status: DC | PRN
  Administered 2014-01-11 (×4): 25 ug via INTRAVENOUS

## 2014-01-11 MED ORDER — MIDAZOLAM HCL 2 MG/2ML IJ SOLN
INTRAMUSCULAR | Status: AC
  Filled 2014-01-11: qty 2

## 2014-01-11 MED ORDER — PHENYLEPHRINE HCL 10 MG/ML IJ SOLN
INTRAMUSCULAR | Status: DC | PRN
  Administered 2014-01-11: 80 ug via INTRAVENOUS

## 2014-01-11 MED ORDER — PROPOFOL 10 MG/ML IV EMUL
INTRAVENOUS | Status: AC
  Filled 2014-01-11: qty 20

## 2014-01-11 MED ORDER — LIDOCAINE-EPINEPHRINE 1 %-1:100000 IJ SOLN
INTRAMUSCULAR | Status: DC | PRN
  Administered 2014-01-11: 10 mL

## 2014-01-11 MED ORDER — ONDANSETRON HCL 4 MG/2ML IJ SOLN
INTRAMUSCULAR | Status: DC | PRN
  Administered 2014-01-11: 4 mg via INTRAVENOUS

## 2014-01-11 MED ORDER — DEXAMETHASONE SODIUM PHOSPHATE 10 MG/ML IJ SOLN
INTRAMUSCULAR | Status: AC
  Filled 2014-01-11: qty 1

## 2014-01-11 MED ORDER — PROPOFOL 10 MG/ML IV BOLUS
INTRAVENOUS | Status: DC | PRN
  Administered 2014-01-11: 100 mg via INTRAVENOUS
  Administered 2014-01-11: 200 mg via INTRAVENOUS

## 2014-01-11 MED ORDER — LIDOCAINE-EPINEPHRINE 1 %-1:100000 IJ SOLN
INTRAMUSCULAR | Status: AC
  Filled 2014-01-11: qty 1

## 2014-01-11 MED ORDER — LACTATED RINGERS IV SOLN
INTRAVENOUS | Status: DC
  Administered 2014-01-11 (×2): via INTRAVENOUS

## 2014-01-11 MED ORDER — LIDOCAINE HCL (CARDIAC) 20 MG/ML IV SOLN
INTRAVENOUS | Status: AC
  Filled 2014-01-11: qty 5

## 2014-01-11 MED ORDER — KETOROLAC TROMETHAMINE 30 MG/ML IJ SOLN: 15.0000 mg | Freq: Once | INTRAMUSCULAR | Status: DC | PRN

## 2014-01-11 MED ORDER — KETOROLAC TROMETHAMINE 30 MG/ML IJ SOLN
INTRAMUSCULAR | Status: DC | PRN
  Administered 2014-01-11: 30 mg via INTRAVENOUS

## 2014-01-11 MED ORDER — GLYCINE 1.5 % IR SOLN
Status: DC | PRN
  Administered 2014-01-11: 3000 mL

## 2014-01-11 MED ORDER — DEXAMETHASONE SODIUM PHOSPHATE 10 MG/ML IJ SOLN
INTRAMUSCULAR | Status: DC | PRN
  Administered 2014-01-11: 10 mg via INTRAVENOUS

## 2014-01-11 MED ORDER — HYDROCODONE-ACETAMINOPHEN 5-300 MG PO TABS
1.0000 | ORAL_TABLET | Freq: Four times a day (QID) | ORAL | Status: DC
Start: 2014-01-11 — End: 2014-03-22

## 2014-01-11 MED ORDER — LIDOCAINE HCL (CARDIAC) 20 MG/ML IV SOLN
INTRAVENOUS | Status: DC | PRN
  Administered 2014-01-11: 60 mg via INTRAVENOUS

## 2014-01-11 MED ORDER — GLYCOPYRROLATE 0.2 MG/ML IJ SOLN
INTRAMUSCULAR | Status: AC
Start: 2014-01-11 — End: 2014-01-11
  Filled 2014-01-11: qty 1

## 2014-01-11 MED ORDER — GLYCOPYRROLATE 0.2 MG/ML IJ SOLN
INTRAMUSCULAR | Status: DC | PRN
  Administered 2014-01-11: 0.2 mg via INTRAVENOUS

## 2014-01-11 SURGICAL SUPPLY — 24 items
CANISTER SUCT 3000ML (MISCELLANEOUS) ×3 IMPLANT
CATH ROBINSON RED A/P 16FR (CATHETERS) ×3 IMPLANT
CLOTH BEACON ORANGE TIMEOUT ST (SAFETY) ×3 IMPLANT
CONTAINER PREFILL 10% NBF 60ML (FORM) ×6 IMPLANT
DILATOR CANAL MILEX (MISCELLANEOUS) IMPLANT
DRAPE HYSTEROSCOPY (DRAPE) ×3 IMPLANT
DRSG TELFA 3X8 NADH (GAUZE/BANDAGES/DRESSINGS) ×3 IMPLANT
ELECT REM PT RETURN 9FT ADLT (ELECTROSURGICAL) ×3
ELECTRODE REM PT RTRN 9FT ADLT (ELECTROSURGICAL) IMPLANT
GLOVE BIOGEL PI IND STRL 7.0 (GLOVE) ×1 IMPLANT
GLOVE BIOGEL PI INDICATOR 7.0 (GLOVE) ×2
GLOVE ECLIPSE 6.5 STRL STRAW (GLOVE) ×6 IMPLANT
GOWN STRL REUS W/TWL LRG LVL3 (GOWN DISPOSABLE) ×6 IMPLANT
LOOP ANGLED CUTTING 22FR (CUTTING LOOP) ×2 IMPLANT
NDL SPNL 22GX3.5 QUINCKE BK (NEEDLE) ×1 IMPLANT
NEEDLE SPNL 22GX3.5 QUINCKE BK (NEEDLE) ×3 IMPLANT
PACK VAGINAL MINOR WOMEN LF (CUSTOM PROCEDURE TRAY) ×3 IMPLANT
PAD DRESSING TELFA 3X8 NADH (GAUZE/BANDAGES/DRESSINGS) ×1 IMPLANT
PAD OB MATERNITY 4.3X12.25 (PERSONAL CARE ITEMS) ×3 IMPLANT
SET TUBING HYSTEROSCOPY 2 NDL (TUBING) ×2 IMPLANT
SYR CONTROL 10ML LL (SYRINGE) ×3 IMPLANT
TOWEL OR 17X24 6PK STRL BLUE (TOWEL DISPOSABLE) ×6 IMPLANT
TUBE HYSTEROSCOPY W Y-CONNECT (TUBING) ×2 IMPLANT
WATER STERILE IRR 1000ML POUR (IV SOLUTION) ×3 IMPLANT

## 2014-01-11 NOTE — Transfer of Care (Signed)
Immediate Anesthesia Transfer of Care Note  Patient: Morgan Elliott  Procedure(s) Performed: Procedure(s): DILATATION & CURETTAGE/HYSTEROSCOPY WITH RESECTOCOPE (N/A)  Patient Location: PACU  Anesthesia Type:General  Level of Consciousness: awake, alert  and oriented  Airway & Oxygen Therapy: Patient Spontanous Breathing and Patient connected to nasal cannula oxygen  Post-op Assessment: Report given to PACU RN, Post -op Vital signs reviewed and stable and Patient moving all extremities X 4  Post vital signs: Reviewed and stable  Complications: No apparent anesthesia complications

## 2014-01-11 NOTE — Anesthesia Preprocedure Evaluation (Signed)
Anesthesia Evaluation  Patient identified by MRN, date of birth, ID band Patient awake    Reviewed: Allergy & Precautions, H&P , NPO status , Patient's Chart, lab work & pertinent test results  Airway Mallampati: I TM Distance: >3 FB Neck ROM: full    Dental no notable dental hx. (+) Teeth Intact   Pulmonary neg pulmonary ROS,    Pulmonary exam normal       Cardiovascular hypertension, Pt. on medications     Neuro/Psych negative neurological ROS     GI/Hepatic Neg liver ROS,   Endo/Other  negative endocrine ROS  Renal/GU      Musculoskeletal   Abdominal Normal abdominal exam  (+)   Peds  Hematology negative hematology ROS (+)   Anesthesia Other Findings   Reproductive/Obstetrics negative OB ROS                           Anesthesia Physical Anesthesia Plan  ASA: II  Anesthesia Plan: General   Post-op Pain Management:    Induction: Intravenous  Airway Management Planned: LMA  Additional Equipment:   Intra-op Plan:   Post-operative Plan:   Informed Consent: I have reviewed the patients History and Physical, chart, labs and discussed the procedure including the risks, benefits and alternatives for the proposed anesthesia with the patient or authorized representative who has indicated his/her understanding and acceptance.     Plan Discussed with: CRNA and Surgeon  Anesthesia Plan Comments:         Anesthesia Quick Evaluation

## 2014-01-11 NOTE — Discharge Instructions (Signed)
Post-surgical Instructions, Outpatient Surgery  MAY TAKE IBUPROFEN (ADVIL, MOTRIN) OR ALEVE AFTER 4;40 PM FOR CRAMPS!!!!  You may expect to feel dizzy, weak, and drowsy for as long as 24 hours after receiving the medicine that made you sleep (anesthetic). For the first 24 hours after your surgery:    Do not drive a car, ride a bicycle, participate in physical activities, or take public transportation until you are done taking narcotic pain medicines or as directed by Dr. Sabra Heck.   Do not drink alcohol or take tranquilizers.   Do not take medicine that has not been prescribed by your physicians.   Do not sign important papers or make important decisions while on narcotic pain medicines.   Have a responsible person with you.    PAIN MANAGEMENT  Motrin 800mg .  (This is the same as 4-200mg  over the counter tablets of Motrin or ibuprofen.)  You may take this every eight hours or as needed for cramping.    Vicodin 5/500mg .  For more severe pain, take one or two tablets every four to six hours as needed for pain control.  (Remember that narcotic pain medications increase your risk of constipation.  If this becomes a problem, you may take an over the counter stool softener like Colace 100mg  up to four times a day.)  DO'S AND DON'T'S  Do not take a tub bath for one week.  You may shower on the first day after your surgery  Do not do any heavy lifting for one to two weeks.  This increases the chance of bleeding.  Do move around as you feel able.  Stairs are fine.  You may begin to exercise again as you feel able.  Do not lift any weights for two weeks.  Do not put anything in the vagina for two weeks--no tampons, intercourse, or douching.    REGULAR MEDIATIONS/VITAMINS:  You may restart all of your regular medications as prescribed.  You may restart all of your vitamins as you normally take them.    PLEASE CALL OR SEEK MEDICAL CARE IF:  You have persistent nausea and vomiting.   You  have trouble eating or drinking.   You have an oral temperature above 100.5.   You have constipation that is not helped by adjusting diet or increasing fluid intake. Pain medicines are a common cause of constipation.   You have heavy vaginal bleeding  You have redness or drainage from your incision(s) or there is increasing pain or tenderness near or in the surgical site.

## 2014-01-11 NOTE — Anesthesia Postprocedure Evaluation (Signed)
Anesthesia Post Note  Patient: Morgan Elliott  Procedure(s) Performed: Procedure(s) (LRB): DILATATION & CURETTAGE/HYSTEROSCOPY WITH RESECTOCOPE (N/A)  Anesthesia type: General  Patient location: PACU  Post pain: Pain level controlled  Post assessment: Post-op Vital signs reviewed  Last Vitals:  Filed Vitals:   01/11/14 1130  BP: 117/67  Pulse: 73  Temp:   Resp: 26    Post vital signs: Reviewed  Level of consciousness: sedated  Complications: No apparent anesthesia complications

## 2014-01-11 NOTE — Op Note (Signed)
01/11/2014  11:40 AM  PATIENT:  Morgan Elliott  68 y.o. female  PRE-OPERATIVE DIAGNOSIS:  PMB, endometrial polyp, tamoxifen use  POST-OPERATIVE DIAGNOSIS: same  PROCEDURE:  Procedure(s): DILATATION & CURETTAGE/HYSTEROSCOPY WITH RESECTOCOPE  SURGEON:  Sally Reimers [name]  ASSISTANTS: OR staff   ANESTHESIA:   general  ESTIMATED BLOOD LOSS: 20cc  BLOOD ADMINISTERED:none   FLUIDS: 1600ccLR  UOP: 150cc clear UOP  SPECIMEN:  Endometrial polyp and endometrial curettings  DISPOSITION OF SPECIMEN:  PATHOLOGY  FINDINGS: two polyps with fluffy appearing endometrium.  DESCRIPTION OF OPERATION: Patient was taken to the operating room.  She is placed in the supine position. SCDs were on her lower extremities and functioning properly. General anesthesia with an LMA was administered without difficulty. Dr. Jillyn Hidden oversaw case.  Legs were then placed in the [place] in the low lithotomy position. The legs were lifted to the high lithotomy position and the Betadine prep was used on the inner thighs perineum and vagina x3. Patient was draped in a normal standard fashion. An in and out catheterization with a red rubber Foley catheter was performed. Approximately 150 cc of clear urine was noted. A bivalve speculum was placed the vagina. The anterior lip of the cervix was grasped with single-tooth tenaculum.  A paracervical block of 1% lidocaine mixed one-to-one with epinephrine (1:100,000 units).  10 cc was used total. The cervix is dilated up to #21 [facility] dilators. The endometrial cavity sounded to 9.5 cm.   A 2.9 millimeter diagnostic hysteroscope was obtained. 1.5% glycine was used as a hysteroscopic fluid. The hysteroscope was advanced through the endocervical canal into the endometrial cavity. The tubal ostia could not be seen due to the polyps and fluffy appearing endometrium.  Using the polyp forceps, several passes were made attempting to grasp and completely remove the two polyps.   With visualization of the endometrial cavity, there was a small part of one polyp towards the left cornua.  Decision was made to resect this.  The cervix was dilated up to a #29 with Kennon Rounds dilators.  The base of this polyp was completely cauterized until no polyp tissue was left.  An endometrial curetting was performed at this time with both a #1 smooth and #1 toothed curette until a rough gritty texture was noted in all quadrants.  Photodocumentation was made.  The hysteroscope was removed. The fluid deficit was <100 cc. The tenaculum was removed from the anterior lip of the cervix. The speculum was removed from the vagina. The prep was cleansed of the patient's skin. The legs are positioned back in the supine position. Sponge, lap, needle, initially counts were correct x2. Patient was taken to recovery in stable condition.  COUNTS:  YES  PLAN OF CARE: Transfer to PACU

## 2014-01-11 NOTE — H&P (Signed)
Morgan Elliott is an 68 y.o. female G1P1 with history of PMP bleeding who was evaluated with endometrial biopsy in the office which showed findings consistent with atrophic changes.  As patient is on Tamoxifen, sonohysterography was recommended showing a 66mm endometrial polyp.  Hysteroscopy with poly resection was recommended.  Pt here and ready to proceed. She is accompanied by her husband. Risks and benefits have been discussed and are documented in prior office note.  Pertinent Gynecological History: Menses: post-menopausal Bleeding: post menopausal bleeding Contraception: post menopausal status DES exposure: denies Blood transfusions: none Sexually transmitted diseases: no past history Previous GYN Procedures: as above  Last mammogram: normal Date: 8/14 Last pap: normal Date: 2012 OB History: G1, P1   Menstrual History: Patient's last menstrual period was 10/21/2001.    Past Medical History  Diagnosis Date  . 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 Breast cancer   . Hematoma     right breast  . Hypertension     borderline  . Osteopenia   . Anxiety   . Depression   . H/O hiatal hernia   . Chronic kidney disease     recent dx of stage 3A Chronic kidney disease  . Arthritis     knees, hands, hip  . SVD (spontaneous vaginal delivery)     x 1    Past Surgical History  Procedure Laterality Date  . Cervical fusion  5/04    C3-C6  . Tubal ligation      and lap  . Dilation and curettage of uterus      x3 for heavy bleeding  . Knee arthroscopy  2012    left  . Breast lumpectomy  06/2010    x 2 right   . Breast biopsy  5/08, 12/2009     x 2 left  . Diagnostic laparoscopy      tubal ligation  . Eye surgery      lasik left eye  . Colonoscopy      Family History  Problem Relation Age of Onset  . Hypertension Mother   . Cancer Paternal Grandfather 44    GI related  . Hypertension Father   . Heart  attack Mother     Social History:  reports that she has never smoked. She has never used smokeless tobacco. She reports that she drinks about 7.2 ounces of alcohol per week. She reports that she does not use illicit drugs.  Allergies: No Known Allergies  Prescriptions prior to admission  Medication Sig Dispense Refill  . acetaminophen (TYLENOL) 500 MG tablet Take 1,000 mg by mouth 2 (two) times daily.      Marland Kitchen amLODipine (NORVASC) 5 MG tablet Take 5 mg by mouth daily.      Marland Kitchen aspirin EC 81 MG tablet Take 81 mg by mouth daily.      . calcium carbonate (OS-CAL) 600 MG TABS Take 600 mg by mouth 2 (two) times daily with a meal.      . cetirizine (ZYRTEC) 10 MG tablet Take 10 mg by mouth daily.      . cholecalciferol (VITAMIN D) 400 UNITS TABS Take by mouth daily.       . citalopram (CELEXA) 20 MG tablet Take 1 tablet (20 mg total) by mouth daily.  90 tablet  1  . Clindamycin Phos-Benzoyl Perox (ACANYA) gel Apply 1 application topically daily.      . fluticasone (FLONASE) 50 MCG/ACT nasal  spray Place into both nostrils daily.      . hydrochlorothiazide (MICROZIDE) 12.5 MG capsule Take 6.25 mg by mouth daily. One half to one tablet daily      . losartan (COZAAR) 50 MG tablet Take 50 mg by mouth daily. Half a tablet daily      . Multiple Vitamins-Minerals (CENTRUM SILVER PO) Take 1 each by mouth daily.       . naproxen sodium (ANAPROX) 220 MG tablet Take 220 mg by mouth 2 (two) times daily with a meal. As needed      . Potassium Gluconate 550 MG TABS Take 550 mg by mouth daily.      . ranitidine (ZANTAC) 150 MG capsule Take 150 mg by mouth daily.      . tamoxifen (NOLVADEX) 20 MG tablet Take 1 tablet (20 mg total) by mouth daily.  90 tablet  2  . zaleplon (SONATA) 5 MG capsule Take 1 capsule (5 mg total) by mouth at bedtime as needed for sleep.  20 capsule  5    Review of Systems  All other systems reviewed and are negative.    Blood pressure 126/71, pulse 74, temperature 97.5 F (36.4 C),  temperature source Oral, resp. rate 18, last menstrual period 10/21/2001, SpO2 97.00%. Physical Exam  Constitutional: She is oriented to person, place, and time. She appears well-developed and well-nourished.  Cardiovascular: Normal rate and regular rhythm.   Respiratory: Effort normal and breath sounds normal.  Musculoskeletal: Normal range of motion.  Neurological: She is alert and oriented to person, place, and time.  Skin: Skin is warm and dry.  Psychiatric: She has a normal mood and affect.    No results found for this or any previous visit (from the past 24 hour(s)).  No results found.  Assessment/Plan: 68 yo G1P1 here for hysteroscopy with polyp resection with D&C.  All questions answered.  Pt ready to proceed.  Hale Bogus [facility] 01/11/2014, 9:17 AM

## 2014-01-12 ENCOUNTER — Encounter (HOSPITAL_COMMUNITY): Payer: Self-pay | Admitting: Obstetrics & Gynecology

## 2014-01-24 ENCOUNTER — Telehealth: Payer: Self-pay | Admitting: Emergency Medicine

## 2014-01-24 NOTE — Telephone Encounter (Signed)
Spoke with patient. Calling to ask if she would be willing to move her follow up appointment from afternoon on 4/7 to earlier morning. Patient agreeable and appointment changed to 1115.

## 2014-01-25 ENCOUNTER — Ambulatory Visit: Payer: Medicare Other | Admitting: Obstetrics & Gynecology

## 2014-01-25 ENCOUNTER — Ambulatory Visit (INDEPENDENT_AMBULATORY_CARE_PROVIDER_SITE_OTHER): Payer: Medicare Other | Admitting: Obstetrics & Gynecology

## 2014-01-25 VITALS — BP 124/70 | HR 68 | Resp 20 | Ht 66.0 in | Wt 167.0 lb

## 2014-01-25 DIAGNOSIS — N840 Polyp of corpus uteri: Secondary | ICD-10-CM

## 2014-01-25 NOTE — Progress Notes (Signed)
Post Operative Visit  Procedure:D&C/hysteroscopy with resection  Days Post-op: 15  Subjective: Two weeks post op from hysteroscopic polyp resection.  No fevers.  No pain.  Took only Tylenol.  Didn't need the narcotic.  Voiding fine.  Reviewed images from procedure and pathology.  Objective: BP 124/70  Pulse 68  Resp 20  Ht 5\' 6"  (1.676 m)  Wt 167 lb (75.751 kg)  BMI 26.97 kg/m2  LMP 10/21/2001  EXAM General: alert, WNWD, NAD GI: soft, non-tender; bowel sounds normal; no masses,  no organomegaly Extremities: extremities normal, atraumatic, no cyanosis or edema Vaginal Bleeding: none, cervix closed, no CMP  Assessment: s/p Hysteroscopy, polyp resection, D&C  Plan: F/U for AEX or if has any new GYN issues. Knows to call with future bleeding.

## 2014-01-26 ENCOUNTER — Encounter: Payer: Self-pay | Admitting: Obstetrics & Gynecology

## 2014-02-03 DIAGNOSIS — M1710 Unilateral primary osteoarthritis, unspecified knee: Secondary | ICD-10-CM | POA: Diagnosis not present

## 2014-02-08 ENCOUNTER — Telehealth: Payer: Self-pay | Admitting: Oncology

## 2014-02-08 NOTE — Telephone Encounter (Signed)
kk out - moved 4/22 appt to 5/29. lmonvm for pt and then called cell and was able to s/w pt re change. per pt she will be out of town 5/29. appt r/s from 5/29 to 5/13 lb/dr kaminenei @ 1:30pm.

## 2014-02-09 ENCOUNTER — Ambulatory Visit: Payer: Medicare Other | Admitting: Oncology

## 2014-02-09 ENCOUNTER — Other Ambulatory Visit: Payer: Medicare Other

## 2014-02-12 ENCOUNTER — Encounter: Payer: Self-pay | Admitting: Obstetrics & Gynecology

## 2014-03-01 ENCOUNTER — Other Ambulatory Visit: Payer: Self-pay | Admitting: *Deleted

## 2014-03-01 DIAGNOSIS — D0510 Intraductal carcinoma in situ of unspecified breast: Secondary | ICD-10-CM

## 2014-03-02 ENCOUNTER — Encounter: Payer: Medicare Other | Admitting: Oncology

## 2014-03-02 ENCOUNTER — Telehealth: Payer: Self-pay | Admitting: Adult Health

## 2014-03-02 ENCOUNTER — Other Ambulatory Visit: Payer: Medicare Other | Admitting: Oncology

## 2014-03-05 ENCOUNTER — Other Ambulatory Visit: Payer: Self-pay | Admitting: Oncology

## 2014-03-05 DIAGNOSIS — D0590 Unspecified type of carcinoma in situ of unspecified breast: Secondary | ICD-10-CM

## 2014-03-09 ENCOUNTER — Other Ambulatory Visit: Payer: Self-pay | Admitting: Dermatology

## 2014-03-09 DIAGNOSIS — D2370 Other benign neoplasm of skin of unspecified lower limb, including hip: Secondary | ICD-10-CM | POA: Diagnosis not present

## 2014-03-09 DIAGNOSIS — D1801 Hemangioma of skin and subcutaneous tissue: Secondary | ICD-10-CM | POA: Diagnosis not present

## 2014-03-09 DIAGNOSIS — Z85828 Personal history of other malignant neoplasm of skin: Secondary | ICD-10-CM | POA: Diagnosis not present

## 2014-03-09 DIAGNOSIS — D239 Other benign neoplasm of skin, unspecified: Secondary | ICD-10-CM | POA: Diagnosis not present

## 2014-03-09 DIAGNOSIS — L821 Other seborrheic keratosis: Secondary | ICD-10-CM | POA: Diagnosis not present

## 2014-03-09 DIAGNOSIS — D485 Neoplasm of uncertain behavior of skin: Secondary | ICD-10-CM | POA: Diagnosis not present

## 2014-03-09 DIAGNOSIS — I8310 Varicose veins of unspecified lower extremity with inflammation: Secondary | ICD-10-CM | POA: Diagnosis not present

## 2014-03-18 ENCOUNTER — Other Ambulatory Visit: Payer: Medicare Other

## 2014-03-18 ENCOUNTER — Ambulatory Visit: Payer: Medicare Other

## 2014-03-21 ENCOUNTER — Other Ambulatory Visit: Payer: Self-pay | Admitting: *Deleted

## 2014-03-21 DIAGNOSIS — D0510 Intraductal carcinoma in situ of unspecified breast: Secondary | ICD-10-CM

## 2014-03-22 ENCOUNTER — Other Ambulatory Visit (HOSPITAL_BASED_OUTPATIENT_CLINIC_OR_DEPARTMENT_OTHER): Payer: Medicare Other

## 2014-03-22 ENCOUNTER — Encounter: Payer: Self-pay | Admitting: Adult Health

## 2014-03-22 ENCOUNTER — Ambulatory Visit (HOSPITAL_BASED_OUTPATIENT_CLINIC_OR_DEPARTMENT_OTHER): Payer: Medicare Other | Admitting: Adult Health

## 2014-03-22 VITALS — BP 138/80 | HR 75 | Temp 98.7°F | Resp 18 | Ht 66.0 in | Wt 167.9 lb

## 2014-03-22 DIAGNOSIS — D47Z9 Other specified neoplasms of uncertain behavior of lymphoid, hematopoietic and related tissue: Secondary | ICD-10-CM | POA: Diagnosis not present

## 2014-03-22 DIAGNOSIS — D0510 Intraductal carcinoma in situ of unspecified breast: Secondary | ICD-10-CM

## 2014-03-22 DIAGNOSIS — D0590 Unspecified type of carcinoma in situ of unspecified breast: Secondary | ICD-10-CM

## 2014-03-22 LAB — CBC WITH DIFFERENTIAL/PLATELET
BASO%: 0.8 % (ref 0.0–2.0)
Basophils Absolute: 0 10*3/uL (ref 0.0–0.1)
EOS ABS: 0.1 10*3/uL (ref 0.0–0.5)
EOS%: 2.4 % (ref 0.0–7.0)
HCT: 40.8 % (ref 34.8–46.6)
HGB: 13.6 g/dL (ref 11.6–15.9)
LYMPH#: 1.2 10*3/uL (ref 0.9–3.3)
LYMPH%: 22 % (ref 14.0–49.7)
MCH: 32.5 pg (ref 25.1–34.0)
MCHC: 33.3 g/dL (ref 31.5–36.0)
MCV: 97.5 fL (ref 79.5–101.0)
MONO#: 0.8 10*3/uL (ref 0.1–0.9)
MONO%: 14.1 % — ABNORMAL HIGH (ref 0.0–14.0)
NEUT#: 3.4 10*3/uL (ref 1.5–6.5)
NEUT%: 60.7 % (ref 38.4–76.8)
PLATELETS: 228 10*3/uL (ref 145–400)
RBC: 4.18 10*6/uL (ref 3.70–5.45)
RDW: 13.9 % (ref 11.2–14.5)
WBC: 5.6 10*3/uL (ref 3.9–10.3)

## 2014-03-22 LAB — COMPREHENSIVE METABOLIC PANEL (CC13)
ALBUMIN: 3.8 g/dL (ref 3.5–5.0)
ALT: 13 U/L (ref 0–55)
ANION GAP: 14 meq/L — AB (ref 3–11)
AST: 23 U/L (ref 5–34)
Alkaline Phosphatase: 43 U/L (ref 40–150)
BILIRUBIN TOTAL: 0.54 mg/dL (ref 0.20–1.20)
BUN: 18.5 mg/dL (ref 7.0–26.0)
CALCIUM: 9.7 mg/dL (ref 8.4–10.4)
CHLORIDE: 106 meq/L (ref 98–109)
CO2: 22 mEq/L (ref 22–29)
Creatinine: 0.9 mg/dL (ref 0.6–1.1)
GLUCOSE: 95 mg/dL (ref 70–140)
POTASSIUM: 5.3 meq/L — AB (ref 3.5–5.1)
SODIUM: 142 meq/L (ref 136–145)
Total Protein: 7.1 g/dL (ref 6.4–8.3)

## 2014-03-22 NOTE — Patient Instructions (Signed)
You are doing well.  You have no sign of recurrence.  Proceed with a mammogram in August, 2015.  I recommend you continue Tamoxifen daily.  I recommend healthy diet, exercise, and monthly self breast exams.    Breast Self-Awareness Practicing breast self-awareness may pick up problems early, prevent significant medical complications, and possibly save your life. By practicing breast self-awareness, you can become familiar with how your breasts look and feel and if your breasts are changing. This allows you to notice changes early. It can also offer you some reassurance that your breast health is good. One way to learn what is normal for your breasts and whether your breasts are changing is to do a breast self-exam. If you find a lump or something that was not present in the past, it is best to contact your caregiver right away. Other findings that should be evaluated by your caregiver include nipple discharge, especially if it is bloody; skin changes or reddening; areas where the skin seems to be pulled in (retracted); or new lumps and bumps. Breast pain is seldom associated with cancer (malignancy), but should also be evaluated by a caregiver. HOW TO PERFORM A BREAST SELF-EXAM The best time to examine your breasts is 5 7 days after your menstrual period is over. During menstruation, the breasts are lumpier, and it may be more difficult to pick up changes. If you do not menstruate, have reached menopause, or had your uterus removed (hysterectomy), you should examine your breasts at regular intervals, such as monthly. If you are breastfeeding, examine your breasts after a feeding or after using a breast pump. Breast implants do not decrease the risk for lumps or tumors, so continue to perform breast self-exams as recommended. Talk to your caregiver about how to determine the difference between the implant and breast tissue. Also, talk about the amount of pressure you should use during the exam. Over time, you  will become more familiar with the variations of your breasts and more comfortable with the exam. A breast self-exam requires you to remove all your clothes above the waist. 1. Look at your breasts and nipples. Stand in front of a mirror in a room with good lighting. With your hands on your hips, push your hands firmly downward. Look for a difference in shape, contour, and size from one breast to the other (asymmetry). Asymmetry includes puckers, dips, or bumps. Also, look for skin changes, such as reddened or scaly areas on the breasts. Look for nipple changes, such as discharge, dimpling, repositioning, or redness. 2. Carefully feel your breasts. This is best done either in the shower or tub while using soapy water or when flat on your back. Place the arm (on the side of the breast you are examining) above your head. Use the pads (not the fingertips) of your three middle fingers on your opposite hand to feel your breasts. Start in the underarm area and use  inch (2 cm) overlapping circles to feel your breast. Use 3 different levels of pressure (light, medium, and firm pressure) at each circle before moving to the next circle. The light pressure is needed to feel the tissue closest to the skin. The medium pressure will help to feel breast tissue a little deeper, while the firm pressure is needed to feel the tissue close to the ribs. Continue the overlapping circles, moving downward over the breast until you feel your ribs below your breast. Then, move one finger-width towards the center of the body. Continue to use  the  inch (2 cm) overlapping circles to feel your breast as you move slowly up toward the collar bone (clavicle) near the base of the neck. Continue the up and down exam using all 3 pressures until you reach the middle of the chest. Do this with each breast, carefully feeling for lumps or changes. 3.  Keep a written record with breast changes or normal findings for each breast. By writing this  information down, you do not need to depend only on memory for size, tenderness, or location. Write down where you are in your menstrual cycle, if you are still menstruating. Breast tissue can have some lumps or thick tissue. However, see your caregiver if you find anything that concerns you.  SEEK MEDICAL CARE IF:  You see a change in shape, contour, or size of your breasts or nipples.   You see skin changes, such as reddened or scaly areas on the breasts or nipples.   You have an unusual discharge from your nipples.   You feel a new lump or unusually thick areas.  Document Released: 10/07/2005 Document Revised: 09/23/2012 Document Reviewed: 01/22/2012 [facility] Patient Information 2014 [place].

## 2014-03-22 NOTE — Progress Notes (Signed)
OFFICE PROGRESS NOTE  CC  MCNEILL,WENDY, MD [place] [address] Dr. 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 The final pathology revealed atypical ductal hyperplasia. Otherwise there were noted to be fibrocystic disease on the final lumpectomy specimen.  #2 in the right breast patient was found to have ductal carcinoma in situ stage 0  #3 patient completed radiation therapy to the right breast  #4 she was then begun on tamoxifen 20 mg daily starting in August 2011.  CURRENT THERAPY: Tamoxifen 20 mg daily since August 2011  INTERVAL HISTORY: Morgan Elliott 68 y.o. female returns for evaluation of her h/o DCIS from 2011.  She is taking Tamoxifen 20mg  daily and tolerating it moderately well.  She does have some occasional joint aches and hot flashes due to this.  She has vaginal dryness she is treating with lubricants and denies dyspareunia.  She denies fevers, chills, new pain, breast changes, or any further concerns. We reviewed her health maintenance below.   MEDICAL HISTORY: Past Medical History  Diagnosis Date  . 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 Breast cancer   . Hematoma     right breast  . Hypertension     borderline  . Osteopenia   . Anxiety   . Depression   . H/O hiatal hernia   . Chronic kidney disease     recent dx of stage 3A Chronic kidney disease  . Arthritis     knees, hands, hip  . SVD (spontaneous vaginal delivery)     x 1    ALLERGIES:  has No Known Allergies.  MEDICATIONS:  Current Outpatient Prescriptions  Medication Sig Dispense Refill  . acetaminophen (TYLENOL) 500 MG tablet Take 1,000 mg by mouth 2 (two) times daily.      Marland Kitchen  amLODipine (NORVASC) 5 MG tablet Take 5 mg by mouth daily.      Marland Kitchen aspirin EC 81 MG tablet Take 81 mg by mouth daily.      . calcium carbonate (OS-CAL) 600 MG TABS Take 600 mg by mouth 2 (two) times daily with a meal.      . cetirizine (ZYRTEC) 10 MG tablet Take 10 mg by mouth daily.      . cholecalciferol (VITAMIN D) 400 UNITS TABS Take by mouth daily.       . citalopram (CELEXA) 20 MG tablet Take 1 tablet (20 mg total) by mouth daily.  90 tablet  1  . fluticasone (FLONASE) 50 MCG/ACT nasal spray Place into both nostrils daily.      . hydrochlorothiazide (MICROZIDE) 12.5 MG capsule Take 6.25 mg by mouth daily. One half of the 12.5 mg tablet.      Marland Kitchen losartan (COZAAR) 50 MG tablet Take 50 mg by mouth. Pt takes half of a 50 mg tablet. Dosage is 25 mg daily.      . Multiple Vitamins-Minerals (CENTRUM SILVER PO) Take 1 each by mouth daily.       . Potassium Gluconate 550 MG TABS Take 550 mg by mouth daily.      . ranitidine (ZANTAC) 300 MG tablet Take 300 mg by mouth at bedtime.      . tamoxifen (NOLVADEX) 20 MG tablet TAKE 1  TABLET BY MOUTH     DAILY  90 tablet  0  . TURMERIC PO Take 1 capsule by mouth 2 (two) times daily.      . Clindamycin Phos-Benzoyl Perox (ACANYA) gel Apply 1 application topically daily.      . naproxen sodium (ANAPROX) 220 MG tablet Take 220 mg by mouth 2 (two) times daily with a meal. As needed      . zaleplon (SONATA) 5 MG capsule Take 1 capsule (5 mg total) by mouth at bedtime as needed for sleep.  20 capsule  5   No current facility-administered medications for this visit.    SURGICAL HISTORY:  Past Surgical History  Procedure Laterality Date  . Cervical fusion  5/04    C3-C6  . Tubal ligation      and lap  . Dilation and curettage of uterus      x3 for heavy bleeding  . Knee arthroscopy  2012    left  . Breast lumpectomy  06/2010    x 2 right   . Breast biopsy  5/08, 12/2009     x 2 left  . Diagnostic laparoscopy      tubal ligation  . Eye surgery       lasik left eye  . Colonoscopy    . Dilatation & currettage/hysteroscopy with resectocope N/A 01/11/2014    Procedure: [place];  Surgeon: Lyman Speller, MD;  Location: [facility];  Service: Gynecology;  Laterality: N/A;  resection of endometrial polyp    REVIEW OF SYSTEMS:  A 10 point review of systems was conducted and is otherwise negative except for what is noted above.    Health Maintenance  Mammogram: 05/2013 Colonoscopy:2014, 10 year f/u recommended Bone Density Scan:05/24/2012 Pap Smear: 2014 Eye Exam: scheduled 03/2014 Vitamin D Level: Dr. Sabra Heck Lipid Panel: per PCP   PHYSICAL EXAMINATION: BP 138/80  Pulse 75  Temp(Src) 98.7 F (37.1 C) (Oral)  Resp 18  Ht 5\' 6"  (1.676 m)  Wt 167 lb 14.4 oz (76.159 kg)  BMI 27.11 kg/m2  LMP 10/21/2001 GENERAL: Patient is a well appearing female in no acute distress HEENT:  Sclerae anicteric.  Oropharynx clear and moist. No ulcerations or evidence of oropharyngeal candidiasis. Neck is supple.  NODES:  No cervical, supraclavicular, or axillary lymphadenopathy palpated.  BREAST EXAM: s/p lumpectomies without nodularity, no masses, benign bilateral breast exam LUNGS:  Clear to auscultation bilaterally.  No wheezes or rhonchi. HEART:  Regular rate and rhythm. No murmur appreciated. ABDOMEN:  Soft, nontender.  Positive, normoactive bowel sounds. No organomegaly palpated. MSK:  No focal spinal tenderness to palpation. Full range of motion bilaterally in the upper extremities. EXTREMITIES:  No peripheral edema.   SKIN:  Clear with no obvious rashes or skin changes. No nail dyscrasia. NEURO:  Nonfocal. Well oriented.  Appropriate affect. ECOG PERFORMANCE STATUS: 0 - Asymptomatic    LABORATORY DATA: Lab Results  Component Value Date   WBC 5.6 03/22/2014   HGB 13.6 03/22/2014   HCT 40.8 03/22/2014   MCV 97.5 03/22/2014   PLT 228 03/22/2014      Chemistry      Component Value Date/Time   NA 142  03/22/2014 1312   NA 137 12/29/2013 0940   K 5.3* 03/22/2014 1312   K 4.3 12/29/2013 0940   CL 98 12/29/2013 0940   CL 100 02/08/2013 1346   CO2 22 03/22/2014 1312   CO2 28 12/29/2013 0940   BUN 18.5 03/22/2014 1312  BUN 15 12/29/2013 0940   CREATININE 0.9 03/22/2014 1312   CREATININE 0.92 12/29/2013 0940      Component Value Date/Time   CALCIUM 9.7 03/22/2014 1312   CALCIUM 9.6 12/29/2013 0940   ALKPHOS 43 03/22/2014 1312   ALKPHOS 38* 12/09/2011 1403   AST 23 03/22/2014 1312   AST 25 12/09/2011 1403   ALT 13 03/22/2014 1312   ALT 15 12/09/2011 1403   BILITOT 0.54 03/22/2014 1312   BILITOT 0.3 12/09/2011 1403       RADIOGRAPHIC STUDIES:  No results found.  ASSESSMENT: 68 year old female with  #1 right ductal carcinoma in situ in the setting of atypical ductal hyperplasia and lobular carcinoma in situ. Patient underwent lumpectomy followed by radiation therapy and then was begun on tamoxifen 20 mg daily. She is tolerating this very well.  #2 left atypical ductal hyperplasia she has undergone a lumpectomy of this as well and is doing well without evidence of recurrent disease.  #3 patient will continue to get her yearly diagnostic mammogram.  PLAN:   Morgan Elliott is doing well today.  She is tolerating Tamoxifen well.  She will continue this.  Her labs are stable.  I reviewed them with her in detail.  She has no sign of recurrence.  We discussed survivorship and I reviewed her health maintenance in detail.  I recommended healthy diet, exercise, monthly self breast exams along with keeping her mammograms current.  Her next one is due in August.    We will see her back 1 year.   She knows to call us in the interim for any questions or concerns.  We can certainly see her sooner if needed.  All questions were answered. The patient knows to call the clinic with any problems, questions or concerns. We can certainly see the patient much sooner if necessary.  I spent 25 minutes counseling the patient face to  face. The total time spent in the appointment was 30 minutes.   Minette Headland, [place] [phone]  03/23/2014, 3:27 PM

## 2014-03-23 NOTE — Progress Notes (Signed)
Canceled appt.

## 2014-03-24 ENCOUNTER — Telehealth: Payer: Self-pay | Admitting: Oncology

## 2014-03-24 NOTE — Telephone Encounter (Signed)
lvm for pt fro May and June. 2016 appts....mailed pt appt sched and avs  with letter

## 2014-04-06 DIAGNOSIS — M7700 Medial epicondylitis, unspecified elbow: Secondary | ICD-10-CM | POA: Diagnosis not present

## 2014-04-19 DIAGNOSIS — H353190 Nonexudative age-related macular degeneration, unspecified eye, stage unspecified: Secondary | ICD-10-CM | POA: Diagnosis not present

## 2014-04-19 DIAGNOSIS — H2589 Other age-related cataract: Secondary | ICD-10-CM | POA: Diagnosis not present

## 2014-04-19 DIAGNOSIS — H43819 Vitreous degeneration, unspecified eye: Secondary | ICD-10-CM | POA: Diagnosis not present

## 2014-05-09 ENCOUNTER — Other Ambulatory Visit: Payer: Self-pay | Admitting: Obstetrics & Gynecology

## 2014-05-09 DIAGNOSIS — M25549 Pain in joints of unspecified hand: Secondary | ICD-10-CM | POA: Diagnosis not present

## 2014-05-09 DIAGNOSIS — M25539 Pain in unspecified wrist: Secondary | ICD-10-CM | POA: Diagnosis not present

## 2014-05-09 DIAGNOSIS — Z853 Personal history of malignant neoplasm of breast: Secondary | ICD-10-CM

## 2014-05-10 ENCOUNTER — Other Ambulatory Visit: Payer: Self-pay

## 2014-05-10 NOTE — Telephone Encounter (Signed)
Last AEX: 12/07/12 Last refill: 09/24/13 #20, 5 ref Current AEX:01/31/15 Has seen Dr. Sabra Heck since AEX  Please advise

## 2014-05-10 NOTE — Telephone Encounter (Signed)
Faxed rx for zaleplon to [facility] closed

## 2014-05-21 HISTORY — PX: BREAST BIOPSY: SHX20

## 2014-05-30 DIAGNOSIS — M25539 Pain in unspecified wrist: Secondary | ICD-10-CM | POA: Diagnosis not present

## 2014-06-07 ENCOUNTER — Ambulatory Visit
Admission: RE | Admit: 2014-06-07 | Discharge: 2014-06-07 | Disposition: A | Discharge status: Home / Self Care | Payer: Medicare Other | Source: Ambulatory Visit | Attending: Obstetrics & Gynecology | Admitting: Obstetrics & Gynecology

## 2014-06-07 ENCOUNTER — Other Ambulatory Visit: Payer: Self-pay | Admitting: Obstetrics & Gynecology

## 2014-06-07 DIAGNOSIS — Z853 Personal history of malignant neoplasm of breast: Secondary | ICD-10-CM

## 2014-06-07 DIAGNOSIS — R922 Inconclusive mammogram: Secondary | ICD-10-CM | POA: Diagnosis not present

## 2014-06-07 DIAGNOSIS — R921 Mammographic calcification found on diagnostic imaging of breast: Secondary | ICD-10-CM

## 2014-06-08 DIAGNOSIS — M25569 Pain in unspecified knee: Secondary | ICD-10-CM | POA: Diagnosis not present

## 2014-06-08 DIAGNOSIS — M1710 Unilateral primary osteoarthritis, unspecified knee: Secondary | ICD-10-CM | POA: Diagnosis not present

## 2014-06-13 ENCOUNTER — Ambulatory Visit
Admission: RE | Admit: 2014-06-13 | Discharge: 2014-06-13 | Disposition: A | Discharge status: Home / Self Care | Payer: Medicare Other | Source: Ambulatory Visit | Attending: Obstetrics & Gynecology | Admitting: Obstetrics & Gynecology

## 2014-06-13 DIAGNOSIS — N6019 Diffuse cystic mastopathy of unspecified breast: Secondary | ICD-10-CM | POA: Diagnosis not present

## 2014-06-13 DIAGNOSIS — R921 Mammographic calcification found on diagnostic imaging of breast: Secondary | ICD-10-CM

## 2014-06-13 DIAGNOSIS — R928 Other abnormal and inconclusive findings on diagnostic imaging of breast: Secondary | ICD-10-CM | POA: Diagnosis not present

## 2014-06-13 DIAGNOSIS — Z853 Personal history of malignant neoplasm of breast: Secondary | ICD-10-CM

## 2014-06-14 DIAGNOSIS — M19049 Primary osteoarthritis, unspecified hand: Secondary | ICD-10-CM | POA: Diagnosis not present

## 2014-06-15 DIAGNOSIS — N390 Urinary tract infection, site not specified: Secondary | ICD-10-CM | POA: Diagnosis not present

## 2014-06-15 DIAGNOSIS — M899 Disorder of bone, unspecified: Secondary | ICD-10-CM | POA: Diagnosis not present

## 2014-06-15 DIAGNOSIS — R300 Dysuria: Secondary | ICD-10-CM | POA: Diagnosis not present

## 2014-06-15 DIAGNOSIS — Z0000 Encounter for general adult medical examination without abnormal findings: Secondary | ICD-10-CM | POA: Diagnosis not present

## 2014-06-15 DIAGNOSIS — M949 Disorder of cartilage, unspecified: Secondary | ICD-10-CM | POA: Diagnosis not present

## 2014-06-15 DIAGNOSIS — Z791 Long term (current) use of non-steroidal anti-inflammatories (NSAID): Secondary | ICD-10-CM | POA: Diagnosis not present

## 2014-06-15 DIAGNOSIS — N183 Chronic kidney disease, stage 3 unspecified: Secondary | ICD-10-CM | POA: Diagnosis not present

## 2014-06-15 DIAGNOSIS — I10 Essential (primary) hypertension: Secondary | ICD-10-CM | POA: Diagnosis not present

## 2014-06-15 DIAGNOSIS — F339 Major depressive disorder, recurrent, unspecified: Secondary | ICD-10-CM | POA: Diagnosis not present

## 2014-06-22 DIAGNOSIS — M19049 Primary osteoarthritis, unspecified hand: Secondary | ICD-10-CM | POA: Diagnosis not present

## 2014-06-22 DIAGNOSIS — I10 Essential (primary) hypertension: Secondary | ICD-10-CM | POA: Diagnosis not present

## 2014-06-22 DIAGNOSIS — F339 Major depressive disorder, recurrent, unspecified: Secondary | ICD-10-CM | POA: Diagnosis not present

## 2014-06-22 DIAGNOSIS — N183 Chronic kidney disease, stage 3 unspecified: Secondary | ICD-10-CM | POA: Diagnosis not present

## 2014-06-22 DIAGNOSIS — M949 Disorder of cartilage, unspecified: Secondary | ICD-10-CM | POA: Diagnosis not present

## 2014-06-22 DIAGNOSIS — Z23 Encounter for immunization: Secondary | ICD-10-CM | POA: Diagnosis not present

## 2014-06-22 DIAGNOSIS — C50919 Malignant neoplasm of unspecified site of unspecified female breast: Secondary | ICD-10-CM | POA: Diagnosis not present

## 2014-06-22 DIAGNOSIS — M899 Disorder of bone, unspecified: Secondary | ICD-10-CM | POA: Diagnosis not present

## 2014-06-22 DIAGNOSIS — IMO0002 Reserved for concepts with insufficient information to code with codable children: Secondary | ICD-10-CM | POA: Diagnosis not present

## 2014-07-02 ENCOUNTER — Other Ambulatory Visit: Payer: Self-pay | Admitting: Oncology

## 2014-07-19 DIAGNOSIS — M19049 Primary osteoarthritis, unspecified hand: Secondary | ICD-10-CM | POA: Diagnosis not present

## 2014-08-22 ENCOUNTER — Encounter: Payer: Self-pay | Admitting: Adult Health

## 2014-08-25 DIAGNOSIS — M7061 Trochanteric bursitis, right hip: Secondary | ICD-10-CM | POA: Diagnosis not present

## 2014-09-06 ENCOUNTER — Ambulatory Visit: Payer: Medicare Other | Attending: Family Medicine | Admitting: Physical Therapy

## 2014-09-06 DIAGNOSIS — M7061 Trochanteric bursitis, right hip: Secondary | ICD-10-CM | POA: Insufficient documentation

## 2014-09-08 ENCOUNTER — Ambulatory Visit: Payer: Medicare Other | Admitting: Physical Therapy

## 2014-09-08 DIAGNOSIS — M7061 Trochanteric bursitis, right hip: Secondary | ICD-10-CM | POA: Diagnosis not present

## 2014-09-12 ENCOUNTER — Ambulatory Visit: Payer: Medicare Other

## 2014-09-12 DIAGNOSIS — M7061 Trochanteric bursitis, right hip: Secondary | ICD-10-CM | POA: Diagnosis not present

## 2014-09-14 ENCOUNTER — Ambulatory Visit: Payer: Medicare Other

## 2014-09-14 DIAGNOSIS — M7061 Trochanteric bursitis, right hip: Secondary | ICD-10-CM | POA: Diagnosis not present

## 2014-09-19 ENCOUNTER — Ambulatory Visit: Payer: Medicare Other | Admitting: Physical Therapy

## 2014-09-19 DIAGNOSIS — M7061 Trochanteric bursitis, right hip: Secondary | ICD-10-CM | POA: Diagnosis not present

## 2014-09-23 ENCOUNTER — Ambulatory Visit: Payer: Medicare Other | Attending: Family Medicine | Admitting: Physical Therapy

## 2014-09-23 DIAGNOSIS — M7061 Trochanteric bursitis, right hip: Secondary | ICD-10-CM | POA: Diagnosis not present

## 2014-09-26 ENCOUNTER — Ambulatory Visit: Payer: Medicare Other | Admitting: Physical Therapy

## 2014-09-26 DIAGNOSIS — M7061 Trochanteric bursitis, right hip: Secondary | ICD-10-CM | POA: Diagnosis not present

## 2014-09-29 ENCOUNTER — Ambulatory Visit: Payer: Medicare Other | Admitting: Physical Therapy

## 2014-09-29 DIAGNOSIS — M7061 Trochanteric bursitis, right hip: Secondary | ICD-10-CM | POA: Diagnosis not present

## 2014-10-03 ENCOUNTER — Ambulatory Visit: Payer: Medicare Other | Admitting: Physical Therapy

## 2014-10-03 ENCOUNTER — Telehealth: Payer: Self-pay | Admitting: Hematology and Oncology

## 2014-10-03 DIAGNOSIS — M7061 Trochanteric bursitis, right hip: Secondary | ICD-10-CM | POA: Diagnosis not present

## 2014-10-03 NOTE — Telephone Encounter (Signed)
, °

## 2014-10-07 ENCOUNTER — Ambulatory Visit: Payer: Medicare Other | Admitting: Physical Therapy

## 2014-10-07 DIAGNOSIS — M7061 Trochanteric bursitis, right hip: Secondary | ICD-10-CM | POA: Diagnosis not present

## 2014-10-10 ENCOUNTER — Ambulatory Visit: Payer: Medicare Other | Admitting: Physical Therapy

## 2014-10-10 DIAGNOSIS — M7061 Trochanteric bursitis, right hip: Secondary | ICD-10-CM | POA: Diagnosis not present

## 2014-10-12 ENCOUNTER — Ambulatory Visit: Payer: Medicare Other | Admitting: Physical Therapy

## 2014-11-29 DIAGNOSIS — M1812 Unilateral primary osteoarthritis of first carpometacarpal joint, left hand: Secondary | ICD-10-CM | POA: Diagnosis not present

## 2014-11-30 DIAGNOSIS — M170 Bilateral primary osteoarthritis of knee: Secondary | ICD-10-CM | POA: Diagnosis not present

## 2014-11-30 DIAGNOSIS — M7701 Medial epicondylitis, right elbow: Secondary | ICD-10-CM | POA: Diagnosis not present

## 2014-12-15 DIAGNOSIS — I10 Essential (primary) hypertension: Secondary | ICD-10-CM | POA: Diagnosis not present

## 2014-12-15 DIAGNOSIS — M899 Disorder of bone, unspecified: Secondary | ICD-10-CM | POA: Diagnosis not present

## 2014-12-22 DIAGNOSIS — I10 Essential (primary) hypertension: Secondary | ICD-10-CM | POA: Diagnosis not present

## 2014-12-22 DIAGNOSIS — Z0000 Encounter for general adult medical examination without abnormal findings: Secondary | ICD-10-CM | POA: Diagnosis not present

## 2014-12-22 DIAGNOSIS — D492 Neoplasm of unspecified behavior of bone, soft tissue, and skin: Secondary | ICD-10-CM | POA: Diagnosis not present

## 2014-12-22 DIAGNOSIS — M899 Disorder of bone, unspecified: Secondary | ICD-10-CM | POA: Diagnosis not present

## 2014-12-22 DIAGNOSIS — Z853 Personal history of malignant neoplasm of breast: Secondary | ICD-10-CM | POA: Diagnosis not present

## 2014-12-22 DIAGNOSIS — K30 Functional dyspepsia: Secondary | ICD-10-CM | POA: Diagnosis not present

## 2014-12-22 DIAGNOSIS — F339 Major depressive disorder, recurrent, unspecified: Secondary | ICD-10-CM | POA: Diagnosis not present

## 2014-12-22 DIAGNOSIS — M19041 Primary osteoarthritis, right hand: Secondary | ICD-10-CM | POA: Diagnosis not present

## 2014-12-22 DIAGNOSIS — N183 Chronic kidney disease, stage 3 (moderate): Secondary | ICD-10-CM | POA: Diagnosis not present

## 2014-12-23 ENCOUNTER — Other Ambulatory Visit: Payer: Self-pay | Admitting: Family Medicine

## 2014-12-23 DIAGNOSIS — M8580 Other specified disorders of bone density and structure, unspecified site: Secondary | ICD-10-CM

## 2014-12-26 ENCOUNTER — Other Ambulatory Visit: Payer: BLUE CROSS/BLUE SHIELD

## 2014-12-29 DIAGNOSIS — M1812 Unilateral primary osteoarthritis of first carpometacarpal joint, left hand: Secondary | ICD-10-CM | POA: Diagnosis not present

## 2015-01-30 ENCOUNTER — Other Ambulatory Visit: Payer: Self-pay | Admitting: Family Medicine

## 2015-01-30 DIAGNOSIS — R921 Mammographic calcification found on diagnostic imaging of breast: Secondary | ICD-10-CM

## 2015-01-31 ENCOUNTER — Encounter: Payer: Self-pay | Admitting: Obstetrics & Gynecology

## 2015-01-31 ENCOUNTER — Ambulatory Visit (INDEPENDENT_AMBULATORY_CARE_PROVIDER_SITE_OTHER): Payer: Medicare Other | Admitting: Obstetrics & Gynecology

## 2015-01-31 ENCOUNTER — Other Ambulatory Visit: Payer: BLUE CROSS/BLUE SHIELD

## 2015-01-31 VITALS — BP 122/66 | HR 80 | Resp 16 | Ht 65.5 in | Wt 169.0 lb

## 2015-01-31 DIAGNOSIS — Z01419 Encounter for gynecological examination (general) (routine) without abnormal findings: Secondary | ICD-10-CM | POA: Diagnosis not present

## 2015-01-31 DIAGNOSIS — Z124 Encounter for screening for malignant neoplasm of cervix: Secondary | ICD-10-CM

## 2015-01-31 DIAGNOSIS — I10 Essential (primary) hypertension: Secondary | ICD-10-CM | POA: Diagnosis not present

## 2015-01-31 NOTE — Progress Notes (Signed)
69 y.o. G1P1 MarriedCaucasianF here for annual exam.  Had hysteroscopy last March due to endometrial polyp.  No bleeding since.  Doing well.    Seeing Charlestine Massed yearly.  This year will be the fifth year of her Tamoxifen.  She really hopes this will be her last year.  PCP:  Dr. Addison Lank.  Last appt was March.  Blod work was all ok per pt.  Cholesterol was >200 but HDL was 134.  No treatment recommended.     Patient's last menstrual period was 10/21/2001.          Sexually active: Yes.    The current method of family planning is Post-Menopausal.    Exercising: Yes.    Stretching, elliptical, walking Smoker:  no  Health Maintenance: Pap: 2014 Normal  History of abnormal Pap:  no MMG: 05/2014  Diagnostic BIRADS4:Suspicious. Needle Bx: fibrocystic changes Colonoscopy: 4/12, normal - Repeat every 10 year. [facility] - Dr. Penelope Coop BMD:  2013 TDaP:  ~2013 Screening Labs: PCP, Hb today: PCP, Urine today: PCP   reports that she has never smoked. She has never used smokeless tobacco. She reports that she drinks about 4.2 oz of alcohol per week. She reports that she does not use illicit drugs.  Past Medical History  Diagnosis Date  . 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 Breast cancer   . Hematoma     right breast  . Hypertension     borderline  . Osteopenia   . Anxiety   . Depression   . H/O hiatal hernia   . Chronic kidney disease     recent dx of stage 3A Chronic kidney disease  . Arthritis     knees, hands, hip  . SVD (spontaneous vaginal delivery)     x 1  . GERD (gastroesophageal reflux disease)     Past Surgical History  Procedure Laterality Date  . Cervical fusion  5/04    C3-C6  . Tubal ligation      and lap  . Dilation and curettage of uterus      x3 for heavy bleeding  . Knee arthroscopy  2012    left  . Breast lumpectomy  06/2010    x 2 right   . Breast biopsy  5/08, 12/2009     x 2 left  .  Diagnostic laparoscopy      tubal ligation  . Eye surgery      lasik left eye  . Colonoscopy    . Dilatation & currettage/hysteroscopy with resectocope N/A 01/11/2014    Procedure: [place];  Surgeon: Lyman Speller, MD;  Location: [facility];  Service: Gynecology;  Laterality: N/A;  resection of endometrial polyp  . Upper gi endoscopy  ~2013  . Breast biopsy Right 05/2014    Benign     Current Outpatient Prescriptions  Medication Sig Dispense Refill  . acetaminophen (TYLENOL) 500 MG tablet Take 1,000 mg by mouth 2 (two) times daily.    Marland Kitchen amLODipine (NORVASC) 5 MG tablet Take 5 mg by mouth daily.    Marland Kitchen aspirin EC 81 MG tablet Take 81 mg by mouth daily.    . Biotin 5000 MCG CAPS Take 1 capsule by mouth daily.    . calcium carbonate (OS-CAL) 600 MG TABS Take 600 mg by mouth 2 (two) times daily with a meal.    . cetirizine (ZYRTEC) 10 MG tablet Take 10 mg  by mouth daily.    . cholecalciferol (VITAMIN D) 400 UNITS TABS Take 2,000 Units by mouth daily.     . citalopram (CELEXA) 20 MG tablet Take 1 tablet (20 mg total) by mouth daily. 90 tablet 1  . Clindamycin Phos-Benzoyl Perox (ACANYA) gel Apply 1 application topically daily.    . fluticasone (FLONASE) 50 MCG/ACT nasal spray Place into both nostrils daily.    . hydrochlorothiazide (MICROZIDE) 12.5 MG capsule Take 6.25 mg by mouth daily. One half of the 12.5 mg tablet.    Marland Kitchen losartan (COZAAR) 50 MG tablet Take 50 mg by mouth. Pt takes half of a 50 mg tablet. Dosage is 25 mg daily.    . Multiple Vitamins-Minerals (CENTRUM SILVER PO) Take 1 each by mouth daily.     . naproxen sodium (ANAPROX) 220 MG tablet Take 220 mg by mouth 2 (two) times daily with a meal. As needed    . Omega-3 Fatty Acids (FISH OIL) 1000 MG CAPS Take by mouth 2 (two) times daily.    . Potassium Gluconate 550 MG TABS Take 550 mg by mouth daily.    . Probiotic Product (PROBIOTIC DAILY PO) Take by mouth daily.    . ranitidine  (ZANTAC) 300 MG tablet Take 300 mg by mouth at bedtime.    . tamoxifen (NOLVADEX) 20 MG tablet TAKE 1 TABLET BY MOUTH     DAILY 90 tablet 3  . zaleplon (SONATA) 5 MG capsule TAKE 1 CAPSULE AT BEDTIME AS NEEDED 20 capsule 5   No current facility-administered medications for this visit.    Family History  Problem Relation Age of Onset  . Hypertension Mother   . Heart attack Mother   . Cancer Paternal Grandfather 11    GI related  . Hypertension Father   . Cancer Maternal Aunt 84    leukemia, Breast cancer  . Breast cancer Sister 35    ROS:  Pertinent items are noted in HPI.  Otherwise, a comprehensive ROS was negative.  Exam:   BP 122/66 mmHg  Pulse 80  Resp 16  Ht 5' 5.5" (1.664 m)  Wt 169 lb (76.658 kg)  BMI 27.69 kg/m2  LMP 10/21/2001     Height: 5' 5.5" (166.4 cm)  Ht Readings from Last 3 Encounters:  01/31/15 5' 5.5" (1.664 m)  03/22/14 5\' 6"  (1.676 m)  01/25/14 5\' 6"  (1.676 m)    General appearance: alert, cooperative and appears stated age Head: Normocephalic, without obvious abnormality, atraumatic Neck: no adenopathy, supple, symmetrical, trachea midline and thyroid normal to inspection and palpation Lungs: clear to auscultation bilaterally Breasts: normal appearance, no masses or tenderness, well healed bilateral breast scars Heart: regular rate and rhythm Abdomen: soft, non-tender; bowel sounds normal; no masses,  no organomegaly Extremities: extremities normal, atraumatic, no cyanosis or edema Skin: Skin color, texture, turgor normal. No rashes or lesions Lymph nodes: Cervical, supraclavicular, and axillary nodes normal. No abnormal inguinal nodes palpated Neurologic: Grossly normal   Pelvic: External genitalia:  no lesions              Urethra:  normal appearing urethra with no masses, tenderness or lesions              Bartholins and Skenes: normal                 Vagina: normal appearing vagina with normal color and discharge, no lesions               Cervix: no lesions  Pap taken: Yes.   Bimanual Exam:  Uterus:  normal size, contour, position, consistency, mobility, non-tender              Adnexa: normal adnexa and no mass, fullness, tenderness               Rectovaginal: Confirms               Anus:  normal sphincter tone, no lesions  Chaperone was present for exam.  A:  Well Woman with normal exam PMP, no HRT DCIS s/p lumpectomy, node biopsy, RT, tamoxifen Anxiety  P:   Mammogram yearly Seeing oncologist yearly.  Still on Tamoxifen. Rx'es and labs all done by Dr. Addison Lank Release of colonoscopy pap smear obtained today return annually or prn

## 2015-02-02 LAB — IPS PAP SMEAR ONLY

## 2015-03-01 DIAGNOSIS — M7061 Trochanteric bursitis, right hip: Secondary | ICD-10-CM | POA: Diagnosis not present

## 2015-03-01 DIAGNOSIS — M25521 Pain in right elbow: Secondary | ICD-10-CM | POA: Diagnosis not present

## 2015-03-01 DIAGNOSIS — M1711 Unilateral primary osteoarthritis, right knee: Secondary | ICD-10-CM | POA: Diagnosis not present

## 2015-03-11 ENCOUNTER — Other Ambulatory Visit: Payer: Self-pay

## 2015-03-13 ENCOUNTER — Telehealth: Payer: Self-pay | Admitting: Hematology and Oncology

## 2015-03-13 NOTE — Telephone Encounter (Signed)
lvm for pt regarding to [place] 2 appt moved to June 6 per MD request...mailed pt new sched

## 2015-03-16 ENCOUNTER — Other Ambulatory Visit: Payer: Medicare Other

## 2015-03-21 DIAGNOSIS — H52223 Regular astigmatism, bilateral: Secondary | ICD-10-CM | POA: Diagnosis not present

## 2015-03-21 DIAGNOSIS — H2513 Age-related nuclear cataract, bilateral: Secondary | ICD-10-CM | POA: Diagnosis not present

## 2015-03-21 DIAGNOSIS — H43393 Other vitreous opacities, bilateral: Secondary | ICD-10-CM | POA: Diagnosis not present

## 2015-03-21 DIAGNOSIS — H5202 Hypermetropia, left eye: Secondary | ICD-10-CM | POA: Diagnosis not present

## 2015-03-21 DIAGNOSIS — H3531 Nonexudative age-related macular degeneration: Secondary | ICD-10-CM | POA: Diagnosis not present

## 2015-03-23 ENCOUNTER — Other Ambulatory Visit: Payer: Medicare Other

## 2015-03-23 ENCOUNTER — Ambulatory Visit: Payer: Medicare Other | Admitting: Hematology and Oncology

## 2015-03-23 ENCOUNTER — Ambulatory Visit: Payer: Medicare Other | Admitting: Adult Health

## 2015-03-24 ENCOUNTER — Other Ambulatory Visit: Payer: Self-pay

## 2015-03-24 DIAGNOSIS — D0510 Intraductal carcinoma in situ of unspecified breast: Secondary | ICD-10-CM

## 2015-03-27 ENCOUNTER — Ambulatory Visit (HOSPITAL_BASED_OUTPATIENT_CLINIC_OR_DEPARTMENT_OTHER): Payer: Medicare Other | Admitting: Hematology and Oncology

## 2015-03-27 ENCOUNTER — Other Ambulatory Visit (HOSPITAL_BASED_OUTPATIENT_CLINIC_OR_DEPARTMENT_OTHER): Payer: Medicare Other

## 2015-03-27 ENCOUNTER — Telehealth: Payer: Self-pay | Admitting: Hematology and Oncology

## 2015-03-27 VITALS — BP 123/71 | HR 78 | Temp 98.1°F | Resp 18 | Ht 65.75 in | Wt 167.6 lb

## 2015-03-27 DIAGNOSIS — C50912 Malignant neoplasm of unspecified site of left female breast: Secondary | ICD-10-CM

## 2015-03-27 DIAGNOSIS — Z79811 Long term (current) use of aromatase inhibitors: Secondary | ICD-10-CM

## 2015-03-27 DIAGNOSIS — D0592 Unspecified type of carcinoma in situ of left breast: Secondary | ICD-10-CM | POA: Diagnosis not present

## 2015-03-27 DIAGNOSIS — M170 Bilateral primary osteoarthritis of knee: Secondary | ICD-10-CM | POA: Diagnosis not present

## 2015-03-27 DIAGNOSIS — D0510 Intraductal carcinoma in situ of unspecified breast: Secondary | ICD-10-CM

## 2015-03-27 LAB — COMPREHENSIVE METABOLIC PANEL (CC13)
ALT: 24 U/L (ref 0–55)
AST: 33 U/L (ref 5–34)
Albumin: 3.6 g/dL (ref 3.5–5.0)
Alkaline Phosphatase: 43 U/L (ref 40–150)
Anion Gap: 8 meq/L (ref 3–11)
BUN: 10.6 mg/dL (ref 7.0–26.0)
CO2: 27 meq/L (ref 22–29)
Calcium: 9 mg/dL (ref 8.4–10.4)
Chloride: 104 meq/L (ref 98–109)
Creatinine: 0.8 mg/dL (ref 0.6–1.1)
EGFR: 76 mL/min/{1.73_m2} — ABNORMAL LOW
Glucose: 77 mg/dL (ref 70–140)
Potassium: 4.4 meq/L (ref 3.5–5.1)
Sodium: 139 meq/L (ref 136–145)
Total Bilirubin: 0.47 mg/dL (ref 0.20–1.20)
Total Protein: 6.7 g/dL (ref 6.4–8.3)

## 2015-03-27 LAB — CBC WITH DIFFERENTIAL/PLATELET
BASO%: 0.4 % (ref 0.0–2.0)
Basophils Absolute: 0 10*3/uL (ref 0.0–0.1)
EOS%: 3.2 % (ref 0.0–7.0)
Eosinophils Absolute: 0.2 10*3/uL (ref 0.0–0.5)
HCT: 39.3 % (ref 34.8–46.6)
HGB: 13.7 g/dL (ref 11.6–15.9)
LYMPH%: 21.1 % (ref 14.0–49.7)
MCH: 33.6 pg (ref 25.1–34.0)
MCHC: 34.9 g/dL (ref 31.5–36.0)
MCV: 96.3 fL (ref 79.5–101.0)
MONO#: 0.7 10*3/uL (ref 0.1–0.9)
MONO%: 12.5 % (ref 0.0–14.0)
NEUT#: 3.5 10*3/uL (ref 1.5–6.5)
NEUT%: 62.8 % (ref 38.4–76.8)
Platelets: 219 10*3/uL (ref 145–400)
RBC: 4.08 10*6/uL (ref 3.70–5.45)
RDW: 14.2 % (ref 11.2–14.5)
WBC: 5.6 10*3/uL (ref 3.9–10.3)
lymph#: 1.2 10*3/uL (ref 0.9–3.3)
nRBC: 0 % (ref 0–0)

## 2015-03-27 NOTE — Telephone Encounter (Signed)
avs printed for patient,email to gretchen for survivorship

## 2015-03-27 NOTE — Progress Notes (Signed)
Patient Care Team: Cari Caraway, MD as PCP - General (Family Medicine)  DIAGNOSIS: No matching staging information was found for the patient.  SUMMARY OF ONCOLOGIC HISTORY:   Breast cancer, left breast   06/15/2010 Initial Diagnosis Left breast biopsy: Atypical ductal hyperplasia and fibrocystic changes   06/20/2010 - 03/27/2015 Anti-estrogen oral therapy Tamoxifen 20 mg daily   06/27/2010 Surgery Left breast lumpectomy: No malignancy found fibrocystic changes, benign   07/26/2010 - 08/31/2010 Radiation Therapy Adjuvant radiation therapy    CHIEF COMPLIANT: Follow-up of left breast atypical ductal hyperplasia  INTERVAL HISTORY: Morgan Elliott is a 69 year old above-mentioned history of left breast atypical ductal hyperplasia who underwent lumpectomy followed by radiation and tamoxifen. She has been on tamoxifen for the past 5 years and tolerated it fairly well. She is here today to discuss about discontinuing tamoxifen therapy.  REVIEW OF SYSTEMS:   Constitutional: Denies fevers, chills or abnormal weight loss Eyes: Denies blurriness of vision Ears, nose, mouth, throat, and face: Denies mucositis or sore throat Respiratory: Denies cough, dyspnea or wheezes Cardiovascular: Denies palpitation, chest discomfort or lower extremity swelling Gastrointestinal:  Denies nausea, heartburn or change in bowel habits Skin: Denies abnormal skin rashes Lymphatics: Denies new lymphadenopathy or easy bruising Neurological:Denies numbness, tingling or new weaknesses Behavioral/Psych: Mood is stable, no new changes  Breast:  denies any pain or lumps or nodules in either breasts All other systems were reviewed with the patient and are negative.  I have reviewed the past medical history, past surgical history, social history and family history with the patient and they are unchanged from previous note.  ALLERGIES:  has No Known Allergies.  MEDICATIONS:  Current Outpatient Prescriptions  Medication Sig  Dispense Refill  . amLODipine (NORVASC) 5 MG tablet Take 5 mg by mouth daily.    Marland Kitchen aspirin EC 81 MG tablet Take 81 mg by mouth daily.    . Biotin 5000 MCG CAPS Take 1 capsule by mouth daily.    . calcium carbonate (OS-CAL) 600 MG TABS Take 600 mg by mouth 2 (two) times daily with a meal.    . cetirizine (ZYRTEC) 10 MG tablet Take 10 mg by mouth daily.    . cholecalciferol (VITAMIN D) 400 UNITS TABS Take 2,000 Units by mouth daily.     . citalopram (CELEXA) 20 MG tablet Take 1 tablet (20 mg total) by mouth daily. 90 tablet 1  . Clindamycin Phos-Benzoyl Perox (ACANYA) gel Apply 1 application topically daily.    . fluticasone (FLONASE) 50 MCG/ACT nasal spray Place into both nostrils daily.    . hydrochlorothiazide (MICROZIDE) 12.5 MG capsule Take 6.25 mg by mouth daily. One half of the 12.5 mg tablet.    . Ibuprofen 200 MG CAPS Take by mouth as needed.    Marland Kitchen losartan (COZAAR) 50 MG tablet Take 50 mg by mouth. Pt takes half of a 50 mg tablet. Dosage is 25 mg daily.    . Multiple Vitamins-Minerals (CENTRUM SILVER PO) Take 1 each by mouth daily.     . Omega-3 Fatty Acids (FISH OIL) 1000 MG CAPS Take by mouth 2 (two) times daily.    . Potassium Gluconate 550 MG TABS Take 550 mg by mouth daily.    . Probiotic Product (PROBIOTIC DAILY PO) Take by mouth daily.    . ranitidine (ZANTAC) 300 MG tablet Take 300 mg by mouth at bedtime.    . tamoxifen (NOLVADEX) 20 MG tablet TAKE 1 TABLET BY MOUTH     DAILY 90 tablet  3  . zaleplon (SONATA) 5 MG capsule TAKE 1 CAPSULE AT BEDTIME AS NEEDED 20 capsule 5   No current facility-administered medications for this visit.    PHYSICAL EXAMINATION: ECOG PERFORMANCE STATUS: 0 - Asymptomatic  Filed Vitals:   03/27/15 1033  BP: 123/71  Pulse: 78  Temp: 98.1 F (36.7 C)  Resp: 18   Filed Weights   03/27/15 1033  Weight: 167 lb 9.6 oz (76.023 kg)    GENERAL:alert, no distress and comfortable SKIN: skin color, texture, turgor are normal, no rashes or  significant lesions EYES: normal, Conjunctiva are pink and non-injected, sclera clear OROPHARYNX:no exudate, no erythema and lips, buccal mucosa, and tongue normal  NECK: supple, thyroid normal size, non-tender, without nodularity LYMPH:  no palpable lymphadenopathy in the cervical, axillary or inguinal LUNGS: clear to auscultation and percussion with normal breathing effort HEART: regular rate & rhythm and no murmurs and no lower extremity edema ABDOMEN:abdomen soft, non-tender and normal bowel sounds Musculoskeletal:no cyanosis of digits and no clubbing  NEURO: alert & oriented x 3 with fluent speech, no focal motor/sensory deficits BREAST: No palpable masses or nodules in either right or left breasts. No palpable axillary supraclavicular or infraclavicular adenopathy no breast tenderness or nipple discharge. (exam performed in the presence of a chaperone)  LABORATORY DATA:  I have reviewed the data as listed   Chemistry      Component Value Date/Time   NA 139 03/27/2015 1010   NA 137 12/29/2013 0940   K 4.4 03/27/2015 1010   K 4.3 12/29/2013 0940   CL 98 12/29/2013 0940   CL 100 02/08/2013 1346   CO2 27 03/27/2015 1010   CO2 28 12/29/2013 0940   BUN 10.6 03/27/2015 1010   BUN 15 12/29/2013 0940   CREATININE 0.8 03/27/2015 1010   CREATININE 0.92 12/29/2013 0940      Component Value Date/Time   CALCIUM 9.0 03/27/2015 1010   CALCIUM 9.6 12/29/2013 0940   ALKPHOS 43 03/27/2015 1010   ALKPHOS 38* 12/09/2011 1403   AST 33 03/27/2015 1010   AST 25 12/09/2011 1403   ALT 24 03/27/2015 1010   ALT 15 12/09/2011 1403   BILITOT 0.47 03/27/2015 1010   BILITOT 0.3 12/09/2011 1403       Lab Results  Component Value Date   WBC 5.6 03/27/2015   HGB 13.7 03/27/2015   HCT 39.3 03/27/2015   MCV 96.3 03/27/2015   PLT 219 03/27/2015   NEUTROABS 3.5 03/27/2015    ASSESSMENT & PLAN:  Breast cancer, 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  Tamoxifen toxicities: Tolerated it very well without any side effects  Breast Cancer Surveillance: 1. Breast exam 03/27/2015: Normal 2. Mammogram 06/13/2014 suspicious abnormalities in the right breast; biopsy: fibrocystic changes.   Return to clinic in 1 year for survivorship clinic with NP  No orders of the defined types were placed in this encounter.   The patient has a good understanding of the overall plan. she agrees with it. she will call with any problems that may develop before the next visit here.   Rulon Eisenmenger, MD

## 2015-03-27 NOTE — Assessment & Plan Note (Signed)
Right breast atypical ductal hyperplasia and fibrocystic changes: Status post lumpectomy September 2011 followed by radiation started tamoxifen in August 2011 discontinued 03/27/2015.  Tamoxifen toxicities: Tolerated it very well without any side effects  Breast Cancer Surveillance: 1. Breast exam 03/27/2015: Normal 2. Mammogram 06/14/2015 suspicious abnormalities in the right breast biopsy fibrocystic changes.   Return to clinic in 1 year for survivorship clinic with NP

## 2015-04-05 DIAGNOSIS — M170 Bilateral primary osteoarthritis of knee: Secondary | ICD-10-CM | POA: Diagnosis not present

## 2015-04-12 DIAGNOSIS — I8312 Varicose veins of left lower extremity with inflammation: Secondary | ICD-10-CM | POA: Diagnosis not present

## 2015-04-12 DIAGNOSIS — L570 Actinic keratosis: Secondary | ICD-10-CM | POA: Diagnosis not present

## 2015-04-12 DIAGNOSIS — L821 Other seborrheic keratosis: Secondary | ICD-10-CM | POA: Diagnosis not present

## 2015-04-12 DIAGNOSIS — I872 Venous insufficiency (chronic) (peripheral): Secondary | ICD-10-CM | POA: Diagnosis not present

## 2015-04-12 DIAGNOSIS — Z85828 Personal history of other malignant neoplasm of skin: Secondary | ICD-10-CM | POA: Diagnosis not present

## 2015-04-12 DIAGNOSIS — I8311 Varicose veins of right lower extremity with inflammation: Secondary | ICD-10-CM | POA: Diagnosis not present

## 2015-04-12 DIAGNOSIS — D1801 Hemangioma of skin and subcutaneous tissue: Secondary | ICD-10-CM | POA: Diagnosis not present

## 2015-04-13 DIAGNOSIS — M170 Bilateral primary osteoarthritis of knee: Secondary | ICD-10-CM | POA: Diagnosis not present

## 2015-04-17 ENCOUNTER — Other Ambulatory Visit: Payer: Self-pay

## 2015-06-09 ENCOUNTER — Ambulatory Visit
Admission: RE | Admit: 2015-06-09 | Discharge: 2015-06-09 | Disposition: A | Discharge status: Home / Self Care | Payer: Medicare Other | Source: Ambulatory Visit | Attending: Family Medicine | Admitting: Family Medicine

## 2015-06-09 ENCOUNTER — Other Ambulatory Visit: Payer: Self-pay | Admitting: Family Medicine

## 2015-06-09 ENCOUNTER — Other Ambulatory Visit: Payer: BLUE CROSS/BLUE SHIELD

## 2015-06-09 DIAGNOSIS — M8580 Other specified disorders of bone density and structure, unspecified site: Secondary | ICD-10-CM

## 2015-06-09 DIAGNOSIS — R921 Mammographic calcification found on diagnostic imaging of breast: Secondary | ICD-10-CM

## 2015-06-09 DIAGNOSIS — R928 Other abnormal and inconclusive findings on diagnostic imaging of breast: Secondary | ICD-10-CM | POA: Diagnosis not present

## 2015-06-09 DIAGNOSIS — Z780 Asymptomatic menopausal state: Secondary | ICD-10-CM | POA: Diagnosis not present

## 2015-06-09 DIAGNOSIS — Z853 Personal history of malignant neoplasm of breast: Secondary | ICD-10-CM | POA: Diagnosis not present

## 2015-06-09 DIAGNOSIS — M85852 Other specified disorders of bone density and structure, left thigh: Secondary | ICD-10-CM | POA: Diagnosis not present

## 2015-06-27 DIAGNOSIS — M85852 Other specified disorders of bone density and structure, left thigh: Secondary | ICD-10-CM | POA: Diagnosis not present

## 2015-06-27 DIAGNOSIS — R300 Dysuria: Secondary | ICD-10-CM | POA: Diagnosis not present

## 2015-07-19 DIAGNOSIS — Z23 Encounter for immunization: Secondary | ICD-10-CM | POA: Diagnosis not present

## 2015-11-13 DIAGNOSIS — M1612 Unilateral primary osteoarthritis, left hip: Secondary | ICD-10-CM | POA: Diagnosis not present

## 2015-11-13 DIAGNOSIS — M5136 Other intervertebral disc degeneration, lumbar region: Secondary | ICD-10-CM | POA: Diagnosis not present

## 2015-11-14 DIAGNOSIS — M25552 Pain in left hip: Secondary | ICD-10-CM | POA: Diagnosis not present

## 2015-11-21 DIAGNOSIS — M25552 Pain in left hip: Secondary | ICD-10-CM | POA: Diagnosis not present

## 2015-11-26 DIAGNOSIS — J019 Acute sinusitis, unspecified: Secondary | ICD-10-CM | POA: Diagnosis not present

## 2015-12-06 DIAGNOSIS — M1612 Unilateral primary osteoarthritis, left hip: Secondary | ICD-10-CM | POA: Diagnosis not present

## 2015-12-20 DIAGNOSIS — R300 Dysuria: Secondary | ICD-10-CM | POA: Diagnosis not present

## 2015-12-20 DIAGNOSIS — Z853 Personal history of malignant neoplasm of breast: Secondary | ICD-10-CM | POA: Diagnosis not present

## 2015-12-20 DIAGNOSIS — F339 Major depressive disorder, recurrent, unspecified: Secondary | ICD-10-CM | POA: Diagnosis not present

## 2015-12-20 DIAGNOSIS — M85852 Other specified disorders of bone density and structure, left thigh: Secondary | ICD-10-CM | POA: Diagnosis not present

## 2015-12-20 DIAGNOSIS — Z0000 Encounter for general adult medical examination without abnormal findings: Secondary | ICD-10-CM | POA: Diagnosis not present

## 2015-12-20 DIAGNOSIS — M19041 Primary osteoarthritis, right hand: Secondary | ICD-10-CM | POA: Diagnosis not present

## 2015-12-20 DIAGNOSIS — N183 Chronic kidney disease, stage 3 (moderate): Secondary | ICD-10-CM | POA: Diagnosis not present

## 2015-12-20 DIAGNOSIS — M899 Disorder of bone, unspecified: Secondary | ICD-10-CM | POA: Diagnosis not present

## 2015-12-20 DIAGNOSIS — I10 Essential (primary) hypertension: Secondary | ICD-10-CM | POA: Diagnosis not present

## 2015-12-20 DIAGNOSIS — K30 Functional dyspepsia: Secondary | ICD-10-CM | POA: Diagnosis not present

## 2015-12-25 DIAGNOSIS — Z0000 Encounter for general adult medical examination without abnormal findings: Secondary | ICD-10-CM | POA: Diagnosis not present

## 2015-12-25 DIAGNOSIS — S72002D Fracture of unspecified part of neck of left femur, subsequent encounter for closed fracture with routine healing: Secondary | ICD-10-CM | POA: Diagnosis not present

## 2015-12-25 DIAGNOSIS — I10 Essential (primary) hypertension: Secondary | ICD-10-CM | POA: Diagnosis not present

## 2015-12-25 DIAGNOSIS — K30 Functional dyspepsia: Secondary | ICD-10-CM | POA: Diagnosis not present

## 2015-12-25 DIAGNOSIS — F339 Major depressive disorder, recurrent, unspecified: Secondary | ICD-10-CM | POA: Diagnosis not present

## 2015-12-25 DIAGNOSIS — G479 Sleep disorder, unspecified: Secondary | ICD-10-CM | POA: Diagnosis not present

## 2015-12-25 DIAGNOSIS — M85852 Other specified disorders of bone density and structure, left thigh: Secondary | ICD-10-CM | POA: Diagnosis not present

## 2015-12-25 DIAGNOSIS — J309 Allergic rhinitis, unspecified: Secondary | ICD-10-CM | POA: Diagnosis not present

## 2015-12-25 DIAGNOSIS — B373 Candidiasis of vulva and vagina: Secondary | ICD-10-CM | POA: Diagnosis not present

## 2015-12-27 DIAGNOSIS — M1612 Unilateral primary osteoarthritis, left hip: Secondary | ICD-10-CM | POA: Diagnosis not present

## 2016-01-04 DIAGNOSIS — M1612 Unilateral primary osteoarthritis, left hip: Secondary | ICD-10-CM | POA: Diagnosis not present

## 2016-01-11 DIAGNOSIS — M170 Bilateral primary osteoarthritis of knee: Secondary | ICD-10-CM | POA: Diagnosis not present

## 2016-01-18 DIAGNOSIS — M170 Bilateral primary osteoarthritis of knee: Secondary | ICD-10-CM | POA: Diagnosis not present

## 2016-01-25 DIAGNOSIS — M170 Bilateral primary osteoarthritis of knee: Secondary | ICD-10-CM | POA: Diagnosis not present

## 2016-01-30 DIAGNOSIS — M1612 Unilateral primary osteoarthritis, left hip: Secondary | ICD-10-CM | POA: Diagnosis not present

## 2016-03-21 DIAGNOSIS — H524 Presbyopia: Secondary | ICD-10-CM | POA: Diagnosis not present

## 2016-03-21 DIAGNOSIS — H04122 Dry eye syndrome of left lacrimal gland: Secondary | ICD-10-CM | POA: Diagnosis not present

## 2016-03-21 DIAGNOSIS — H1789 Other corneal scars and opacities: Secondary | ICD-10-CM | POA: Diagnosis not present

## 2016-03-21 DIAGNOSIS — H52223 Regular astigmatism, bilateral: Secondary | ICD-10-CM | POA: Diagnosis not present

## 2016-03-21 DIAGNOSIS — H5202 Hypermetropia, left eye: Secondary | ICD-10-CM | POA: Diagnosis not present

## 2016-03-25 ENCOUNTER — Telehealth: Payer: Self-pay | Admitting: Nurse Practitioner

## 2016-03-25 NOTE — Telephone Encounter (Signed)
Left message for patient re 6/20 LTS visit with HM. Schedule mailed.

## 2016-04-02 ENCOUNTER — Encounter: Payer: Medicare Other | Admitting: Nurse Practitioner

## 2016-04-09 ENCOUNTER — Telehealth: Payer: Self-pay | Admitting: Nurse Practitioner

## 2016-04-09 ENCOUNTER — Ambulatory Visit (HOSPITAL_BASED_OUTPATIENT_CLINIC_OR_DEPARTMENT_OTHER): Payer: Medicare Other | Admitting: Nurse Practitioner

## 2016-04-09 ENCOUNTER — Encounter: Payer: Self-pay | Admitting: Nurse Practitioner

## 2016-04-09 VITALS — BP 128/83 | HR 83 | Temp 98.5°F | Resp 18 | Wt 165.4 lb

## 2016-04-09 DIAGNOSIS — Z853 Personal history of malignant neoplasm of breast: Secondary | ICD-10-CM

## 2016-04-09 DIAGNOSIS — C50912 Malignant neoplasm of unspecified site of left female breast: Secondary | ICD-10-CM

## 2016-04-09 NOTE — Patient Instructions (Signed)
Thank you for coming in today!  As we discussed, please continue to perform your self breast exam and report any changes. If you note any new symptoms (please see below), be sure to notify us ASAP.  Your mammogram will be due in August (as scheduled).  Barring any changes, we'll have you return in one year's time for your next appointment or sooner if you have any problems. If the diarrhea returns / worsens, please contact Dr. Addison Lank ASAP and good luck with your hip surgery!!  Please be sure to stop by scheduling on your way out to make those appointment(s).  Looking forward to working with you in the future!  Let us know if you have any questions!  Symptoms to Watch for and Report to Your Provider  . Return of the cancer symptoms you had before- such as a lump or new growth where your cancer first started . New or unusual pain that seems unrelated to an injury and does not go away, including back pain or bone pain . Weight loss without trying/intending . Unexplained bleeding . A rash or allergic reaction, such as swelling, severe itching or wheezing . Chills or fevers . Persistent headaches . Shortness of breath or difficulty breathing . Bloody stools or blood in your urine . Lumps, bumps, swelling and/or nipple discharge . Nausea, vomiting, diarrhea, loss of appetite, or trouble swallowing . A cough that doesn't go away . Abdominal pain . Swelling in your arms or legs . Fractures . Hot flashes or other menopausal symptoms . Any other signs mentioned by your doctor or nurse or any unusual symptoms                 that you just can't explain   NOTE: Just because you have certain symptoms, it doesn't mean the cancer has come back or you have a new cancer. Symptoms can be due to other problems that need to be addressed.  It is important to watch for these symptoms and report them to your provider so you can be medically evaluated for any of these concerns!     Living a Life of Wellness After  Cancer:  *Note: Please consult your health care provider before using any medications, supplements, over-the-counter products, or other interventions.  Also, please consult your primary care provider before you begin any lifestyle program (diet, exercise, etc.).  Your safety is our top priority and we want to make sure you continue to live a long and healthy life!    Healthy Lifestyle Recommendations  As a cancer survivor, it is important develop a lifelong commitment to a healthy lifestyle. A healthy lifestyle can prevent cancer from returning as well as prevent other diseases like heart disease, diabetes and high blood pressure.  These are some things that you can do to have a healthy lifestyle:  Marland Kitchen Maintain a healthy weight.  . Exercise daily per your doctor's orders. . Eat a balanced diet high in fruits, vegetables, bran, and fiber. Limit intake of red meat      and processed foods.  . Limit how much alcohol you consume, if at all. Ali Lowe regular bone mineral density testing for osteoporosis.  . Talk to your doctor about cardiovascular disease or "heart disease" screening. . Stop smoking (if you smoke). . Know your family history. . Be mindful of your emotional, social, and spiritual needs. . Meet regularly with a Primary Care Provider (PCP). Find a PCP if you do not  already have one. . Talk to your doctor about regular cancer screening including screening for colon           cancer, GYN cancers, and skin cancer.

## 2016-04-09 NOTE — Telephone Encounter (Signed)
appt made and avs printed °

## 2016-04-09 NOTE — Progress Notes (Signed)
CLINIC:  Cancer Survivorship   REASON FOR VISIT:  Routine follow-up post-treatment for history of breast cancer.  BRIEF ONCOLOGIC HISTORY:    Breast cancer, left breast ([place])   01/11/2010 Initial Biopsy Right breas lumpectomy: DCIS, fibrocystic changes with calcifications; [facility]   06/15/2010 Procedure Left breast biopsy: Atypical ductal hyperplasia and fibrocystic changes    - 2011 Radiation Therapy Adjuvant radiation therapy   06/20/2010 - 03/27/2015 Anti-estrogen oral therapy Tamoxifen 20 mg daily   06/27/2010 Surgery Left breast lumpectomy: No malignancy found fibrocystic changes, benign    INTERVAL HISTORY:  Morgan Elliott presents to the [facility] today for ongoing follow up regarding her history of breast cancer. Overall, Morgan Elliott reports doing well since her last visit.  She is scheduled for left hip arthroplasty in August 2017 and will later undergo left knee replacement.  Her biggest complaint at this time surrounds a 3 week history of diarrhea.  She states that yesterday was the first day she did not have diarrhea and her stool was soft this am.  She had some abdominal cramping previously but states this has improved.  She denies any blood in her stool.  She reports that it is brown in color without a foul odor. Her last antibiotics were 2-3 months ago.  She has had no fever or chills.  She has not noticed any change within her breast and her last mammogram was in August 2016 and unremarkable.  She is already scheduled for her annual mammogram for this year.  She denies any headache, cough, shortness of breath, or bone pain other than pain in her left hip and knee (which she is looking forward to improving following upcoming surgeries). She reports a good appetite and denies any weight loss.    REVIEW OF SYSTEMS:  General: Denies fever, chills, unintentional weight loss, or generalized fatigue.  HEENT: Wears glasses. Some dental problems (resorption of left front lower tooth).  Denies  visual changes, hearing loss, mouth sores, or difficulty swallowing. Cardiac: Denies palpitations.  Occasional feet swelling. Respiratory: Denies wheeze or dyspnea on exertion.  Breast: As above. GI: As above.  GU: Denies dysuria, hematuria, vaginal bleeding, vaginal discharge, or vaginal dryness.  Musculoskeletal:As above.  Neuro: Denies recent fall or numbness / tingling in her extremities.  Skin: Denies rash, pruritis, or open wounds.  Psych: Occasional depression, stable today.  Denies anxiety, insomnia, or memory loss.   A 14-point review of systems was completed and was negative, except as noted above.   ONCOLOGY TREATMENT TEAM:  1. Surgeon:  Dr. Margot Chimes (retired) at [facility]  2. Medical Oncologist: Dr. Lindi Adie / [facility] Rolls 3. Radiation Oncologist: Dr. Pablo Ledger    PAST MEDICAL/SURGICAL HISTORY:  Past Medical History  Diagnosis Date  . 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 Breast cancer ([place])   . Hematoma     right breast  . Hypertension     borderline  . Osteopenia   . Anxiety   . Depression   . H/O hiatal hernia   . Chronic kidney disease     recent dx of stage 3A Chronic kidney disease  . Arthritis     knees, hands, hip  . SVD (spontaneous vaginal delivery)     x 1  . GERD (gastroesophageal reflux disease)    Past Surgical History  Procedure Laterality Date  . Cervical fusion  5/04    C3-C6  . Tubal ligation  and lap  . Dilation and curettage of uterus      x3 for heavy bleeding  . Knee arthroscopy  2012    left  . Breast lumpectomy  06/2010    x 2 right   . Breast biopsy  5/08, 12/2009     x 2 left  . Diagnostic laparoscopy      tubal ligation  . Eye surgery      lasik left eye  . Colonoscopy    . Dilatation & currettage/hysteroscopy with resectocope N/A 01/11/2014    Procedure: [place];  Surgeon: Lyman Speller, MD;   Location: [facility];  Service: Gynecology;  Laterality: N/A;  resection of endometrial polyp  . Upper gi endoscopy  ~2013  . Breast biopsy Right 05/2014    Benign      ALLERGIES:  No Known Allergies   CURRENT MEDICATIONS:  Current Outpatient Prescriptions on File Prior to Visit  Medication Sig Dispense Refill  . amLODipine (NORVASC) 5 MG tablet Take 5 mg by mouth daily.    Marland Kitchen aspirin EC 81 MG tablet Take 81 mg by mouth daily.    . calcium carbonate (OS-CAL) 600 MG TABS Take 600 mg by mouth 2 (two) times daily with a meal.    . cetirizine (ZYRTEC) 10 MG tablet Take 10 mg by mouth daily.    . cholecalciferol (VITAMIN D) 400 UNITS TABS Take 2,000 Units by mouth daily.     . citalopram (CELEXA) 20 MG tablet Take 1 tablet (20 mg total) by mouth daily. 90 tablet 1  . losartan (COZAAR) 50 MG tablet Take 25 mg by mouth daily. Pt takes half of a 50 mg tablet.    . Multiple Vitamins-Minerals (CENTRUM SILVER PO) Take 1 each by mouth daily.     . Omega-3 Fatty Acids (FISH OIL) 1000 MG CAPS Take by mouth 2 (two) times daily.    . Probiotic Product (PROBIOTIC DAILY PO) Take by mouth daily.    . ranitidine (ZANTAC) 300 MG tablet Take 150 mg by mouth at bedtime.     . zaleplon (SONATA) 5 MG capsule TAKE 1 CAPSULE AT BEDTIME AS NEEDED 20 capsule 5  . Clindamycin Phos-Benzoyl Perox (ACANYA) gel Apply 1 application topically daily. Reported on 04/09/2016    . fluticasone (FLONASE) 50 MCG/ACT nasal spray Place into both nostrils daily. Reported on 04/09/2016     No current facility-administered medications on file prior to visit.     ONCOLOGIC FAMILY HISTORY:  Family History  Problem Relation Age of Onset  . Hypertension Mother   . Heart attack Mother   . Cancer Paternal Grandfather 16    GI related  . Hypertension Father   . Cancer Maternal Aunt 84    leukemia, Breast cancer  . Breast cancer Sister 26     GENETIC COUNSELING/TESTING: No   SOCIAL HISTORY:  Morgan Elliott is married and lives  with her spouse in [place], [place].   Morgan Elliott is currently retired. She denies any current or history of tobacco or illicit drug use.     PHYSICAL EXAMINATION:  Vital Signs: Filed Vitals:   04/09/16 1303  BP: 128/83  Pulse: 83  Temp: 98.5 F (36.9 C)  Resp: 18   ECOG performance status: 0 General: Well-nourished, well-appearing female in no acute distress.  She is unaccompanied in clinic today.   HEENT: Head is atraumatic and normocephalic.  Pupils equal and reactive to light and accomodation. Conjunctivae clear without  exudate.  Sclerae anicteric. Oral mucosa is pink, moist, and intact without lesions.  Oropharynx is pink without lesions or erythema.  Lymph: No cervical, supraclavicular, infraclavicular, or axillary lymphadenopathy noted on palpation.  Cardiovascular: Regular rate and rhythm without murmurs, rubs, or gallops. Respiratory: Clear to auscultation bilaterally. Chest expansion symmetric without accessory muscle use on inspiration or expiration.  Breast: Bilateral breast exam performed.  Right lumpectomy scar intact, without nodularity.  No mass or lesion in either breast bilaterally. GI: Abdomen soft and round. No tenderness to palpation. Bowel sounds normoactive in 4 quadrants. No hepatosplenomegaly.   GU: Deferred.  Musculoskeletal: Muscle strength 5/5 in all extremities.   Neuro: No focal deficits. Steady gait.  Psych: Mood and affect normal and appropriate for situation.  Extremities: No edema, cyanosis, or clubbing.  Skin: Good skin turgor.  Warm and dry. No open lesions noted.   LABORATORY DATA:  No results found for this or any previous visit (from the past 2160 hour(s)).      ASSESSMENT AND PLAN:   1. 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  Ms. Morefield is doing well with no clinical symptoms worrisome for cancer recurrence at this time. I have reviewed the recommendations for ongoing surveillance with her and she will follow-up with [place] in one year's time in the Survivorship clinic with history and physical exam per surveillance protocol.  She will be due mammogram in August 2017 and this has already been scheduled.  She was instructed to make [place] aware if she notes any change within her breast, any new symptoms such as pain, shortness of breath, weight loss, or fatigue.   2. Recent diarrhea: I am hopeful that Ms. Neely's improvements in symptoms over the past 48 hours reflect a turn around in her diarrhea.  I do not feel that she is dehydrated.  Her symptoms are less consistent with infection (including c diff) based on the timing of her last antibiotics use.  As this pattern is similar to previous IBD episodes she has had, I am more likely to think that this (IBD) the case.  However, I have asked her that if her symptoms return in the next few days, to contact Dr Addison Lank ASAP as they need to be evaluated more fully.  She agrees to do so.    3. Cancer screening:  Due to Ms. Crepeau's history and her age, she should receive screening for skin cancers, colon cancer, and gynecologic cancers.  The information and recommendations were shared with the patient and in her written after visit summary.     4. Support services/counseling:  Ms. Hoelzel was offered support today through active listening and expressive supportive counseling.  She was congratulated on another year as a cancer survivor and wished well with her upcoming hip surgery.   A total of 30 minutes of face-to-face time was spent with this patient with greater than 50% of that time in counseling and care-coordination.   Sylvan Cheese, NP  Survivorship Program [facility] [phone]   Note: PRIMARY CARE PROVIDER [facility],  [place] [phone]

## 2016-04-11 ENCOUNTER — Ambulatory Visit: Payer: Medicare Other | Admitting: Obstetrics & Gynecology

## 2016-04-11 ENCOUNTER — Other Ambulatory Visit: Payer: Self-pay | Admitting: Family Medicine

## 2016-04-11 DIAGNOSIS — Z853 Personal history of malignant neoplasm of breast: Secondary | ICD-10-CM

## 2016-04-13 DIAGNOSIS — R197 Diarrhea, unspecified: Secondary | ICD-10-CM | POA: Diagnosis not present

## 2016-04-13 DIAGNOSIS — K589 Irritable bowel syndrome without diarrhea: Secondary | ICD-10-CM | POA: Diagnosis not present

## 2016-04-29 DIAGNOSIS — M1612 Unilateral primary osteoarthritis, left hip: Secondary | ICD-10-CM | POA: Diagnosis not present

## 2016-05-06 DIAGNOSIS — K529 Noninfective gastroenteritis and colitis, unspecified: Secondary | ICD-10-CM | POA: Diagnosis not present

## 2016-05-08 ENCOUNTER — Inpatient Hospital Stay: Admit: 2016-05-08 | Payer: Medicare Other | Admitting: Orthopedic Surgery

## 2016-05-08 DIAGNOSIS — R197 Diarrhea, unspecified: Secondary | ICD-10-CM | POA: Diagnosis not present

## 2016-05-08 DIAGNOSIS — K529 Noninfective gastroenteritis and colitis, unspecified: Secondary | ICD-10-CM | POA: Diagnosis not present

## 2016-05-08 SURGERY — ARTHROPLASTY, HIP, TOTAL, ANTERIOR APPROACH
Anesthesia: Choice | Site: Hip | Laterality: Left

## 2016-05-13 ENCOUNTER — Ambulatory Visit: Payer: Self-pay | Admitting: Orthopedic Surgery

## 2016-05-29 NOTE — Patient Instructions (Signed)
Morgan Elliott  05/29/2016   Your procedure is scheduled on: 06/05/2016  Report to [facility] Main  Entrance take [facility]  elevators to 3rd floor to  [place] at 1120 AM.  Call this number if you have problems the morning of surgery [phone]   Remember: ONLY 1 PERSON MAY GO WITH YOU TO SHORT STAY TO GET  READY MORNING OF [place].  Do not eat food or drink liquids :After Midnight.     Take these medicines the morning of surgery with A SIP OF WATER: Amlodipine, Zyrtec, Celexa.   Use Flonase nasal spray May take HYDROCODONE if needed DO NOT TAKE ANY DIABETIC MEDICATIONS DAY OF YOUR SURGERY                               You may not have any metal on your body including hair pins and              piercings  Do not wear jewelry, make-up, lotions, powders or perfumes, deodorant             Do not wear nail polish.  Do not shave  48 hours prior to surgery.              Men may shave face and neck.   Do not bring valuables to the hospital. [place].  Contacts, dentures or bridgework may not be worn into surgery.  Leave suitcase in the car. After surgery it may be brought to your room.              [place] - Preparing for Surgery Before surgery, you can play an important role.  Because skin is not sterile, your skin needs to be as free of germs as possible.  You can reduce the number of germs on your skin by washing with CHG (chlorahexidine gluconate) soap before surgery.  CHG is an antiseptic cleaner which kills germs and bonds with the skin to continue killing germs even after washing. Please DO NOT use if you have an allergy to CHG or antibacterial soaps.  If your skin becomes reddened/irritated stop using the CHG and inform your nurse when you arrive at Short Stay. Do not shave (including legs and underarms) for at least 48 hours prior to the first CHG shower.  You may shave your face/neck. Please  follow these instructions carefully:  1.  Shower with CHG Soap the night before surgery and the  morning of Surgery.  2.  If you choose to wash your hair, wash your hair first as usual with your  normal  shampoo.  3.  After you shampoo, rinse your hair and body thoroughly to remove the  shampoo.                           4.  Use CHG as you would any other liquid soap.  You can apply chg directly  to the skin and wash                       Gently with a scrungie or clean washcloth.  5.  Apply the CHG Soap to your body ONLY FROM  THE NECK DOWN.   Do not use on face/ open                           Wound or open sores. Avoid contact with eyes, ears mouth and genitals (private parts).                       Wash face,  Genitals (private parts) with your normal soap.             6.  Wash thoroughly, paying special attention to the area where your surgery  will be performed.  7.  Thoroughly rinse your body with warm water from the neck down.  8.  DO NOT shower/wash with your normal soap after using and rinsing off  the CHG Soap.                9.  Pat yourself dry with a clean towel.            10.  Wear clean pajamas.            11.  Place clean sheets on your bed the night of your first shower and do not  sleep with pets. Day of Surgery : Do not apply any lotions/deodorants the morning of surgery.  Please wear clean clothes to the hospital/surgery center.  FAILURE TO FOLLOW THESE INSTRUCTIONS MAY RESULT IN THE CANCELLATION OF YOUR SURGERY PATIENT SIGNATURE_________________________________  NURSE SIGNATURE__________________________________  ________________________________________________________________________  WHAT IS A BLOOD TRANSFUSION? Blood Transfusion Information  A transfusion is the replacement of blood or some of its parts. Blood is made up of multiple cells which provide different functions.  Red blood cells carry oxygen and are used for blood loss replacement.  Welcome blood cells  fight against infection.  Platelets control bleeding.  Plasma helps clot blood.  Other blood products are available for specialized needs, such as hemophilia or other clotting disorders. BEFORE THE TRANSFUSION  Who gives blood for transfusions?   Healthy volunteers who are fully evaluated to make sure their blood is safe. This is blood bank blood. Transfusion therapy is the safest it has ever been in the practice of medicine. Before blood is taken from a donor, a complete history is taken to make sure that person has no history of diseases nor engages in risky social behavior (examples are intravenous drug use or sexual activity with multiple partners). The donor's travel history is screened to minimize risk of transmitting infections, such as malaria. The donated blood is tested for signs of infectious diseases, such as HIV and hepatitis. The blood is then tested to be sure it is compatible with you in order to minimize the chance of a transfusion reaction. If you or a relative donates blood, this is often done in anticipation of surgery and is not appropriate for emergency situations. It takes many days to process the donated blood. RISKS AND COMPLICATIONS Although transfusion therapy is very safe and saves many lives, the main dangers of transfusion include:   Getting an infectious disease.  Developing a transfusion reaction. This is an allergic reaction to something in the blood you were given. Every precaution is taken to prevent this. The decision to have a blood transfusion has been considered carefully by your caregiver before blood is given. Blood is not given unless the benefits outweigh the risks. AFTER THE TRANSFUSION  Right after receiving a blood transfusion, you will usually feel much better and  more energetic. This is especially true if your red blood cells have gotten low (anemic). The transfusion raises the level of the red blood cells which carry oxygen, and this usually  causes an energy increase.  The nurse administering the transfusion will monitor you carefully for complications. HOME CARE INSTRUCTIONS  No special instructions are needed after a transfusion. You may find your energy is better. Speak with your caregiver about any limitations on activity for underlying diseases you may have. SEEK MEDICAL CARE IF:   Your condition is not improving after your transfusion.  You develop redness or irritation at the intravenous (IV) site. SEEK IMMEDIATE MEDICAL CARE IF:  Any of the following symptoms occur over the next 12 hours:  Shaking chills.  You have a temperature by mouth above 102 F (38.9 C), not controlled by medicine.  Chest, back, or muscle pain.  People around you feel you are not acting correctly or are confused.  Shortness of breath or difficulty breathing.  Dizziness and fainting.  You get a rash or develop hives.  You have a decrease in urine output.  Your urine turns a dark color or changes to pink, red, or brown. Any of the following symptoms occur over the next 10 days:  You have a temperature by mouth above 102 F (38.9 C), not controlled by medicine.  Shortness of breath.  Weakness after normal activity.  The Otter part of the eye turns yellow (jaundice).  You have a decrease in the amount of urine or are urinating less often.  Your urine turns a dark color or changes to pink, red, or brown. Document Released: 10/04/2000 Document Revised: 12/30/2011 Document Reviewed: 05/23/2008 ExitCare Patient Information 2014 [place].  _______________________________________________________________________  Incentive Spirometer  An incentive spirometer is a tool that can help keep your lungs clear and active. This tool measures how well you are filling your lungs with each breath. Taking long deep breaths may help reverse or decrease the chance of developing breathing (pulmonary) problems (especially infection)  following:  A long period of time when you are unable to move or be active. BEFORE THE PROCEDURE   If the spirometer includes an indicator to show your best effort, your nurse or respiratory therapist will set it to a desired goal.  If possible, sit up straight or lean slightly forward. Try not to slouch.  Hold the incentive spirometer in an upright position. INSTRUCTIONS FOR USE  1. Sit on the edge of your bed if possible, or sit up as far as you can in bed or on a chair. 2. Hold the incentive spirometer in an upright position. 3. Breathe out normally. 4. Place the mouthpiece in your mouth and seal your lips tightly around it. 5. Breathe in slowly and as deeply as possible, raising the piston or the ball toward the top of the column. 6. Hold your breath for 3-5 seconds or for as long as possible. Allow the piston or ball to fall to the bottom of the column. 7. Remove the mouthpiece from your mouth and breathe out normally. 8. Rest for a few seconds and repeat Steps 1 through 7 at least 10 times every 1-2 hours when you are awake. Take your time and take a few normal breaths between deep breaths. 9. The spirometer may include an indicator to show your best effort. Use the indicator as a goal to work toward during each repetition. 10. After each set of 10 deep breaths, practice coughing to be sure your lungs  are clear. If you have an incision (the cut made at the time of surgery), support your incision when coughing by placing a pillow or rolled up towels firmly against it. Once you are able to get out of bed, walk around indoors and cough well. You may stop using the incentive spirometer when instructed by your caregiver.  RISKS AND COMPLICATIONS  Take your time so you do not get dizzy or light-headed.  If you are in pain, you may need to take or ask for pain medication before doing incentive spirometry. It is harder to take a deep breath if you are having pain. AFTER USE  Rest and  breathe slowly and easily.  It can be helpful to keep track of a log of your progress. Your caregiver can provide you with a simple table to help with this. If you are using the spirometer at home, follow these instructions: [place] IF:   You are having difficultly using the spirometer.  You have trouble using the spirometer as often as instructed.  Your pain medication is not giving enough relief while using the spirometer.  You develop fever of 100.5 F (38.1 C) or higher. SEEK IMMEDIATE MEDICAL CARE IF:   You cough up bloody sputum that had not been present before.  You develop fever of 102 F (38.9 C) or greater.  You develop worsening pain at or near the incision site. MAKE SURE YOU:   Understand these instructions.  Will watch your condition.  Will get help right away if you are not doing well or get worse. Document Released: 02/17/2007 Document Revised: 12/30/2011 Document Reviewed: 04/20/2007 [facility] Patient Information 2014 [place], [place].   ________________________________________________________________________

## 2016-05-30 ENCOUNTER — Encounter (HOSPITAL_COMMUNITY): Payer: Self-pay

## 2016-05-30 ENCOUNTER — Encounter (HOSPITAL_COMMUNITY)
Admission: RE | Admit: 2016-05-30 | Discharge: 2016-05-30 | Disposition: A | Discharge status: Home / Self Care | Payer: Medicare Other | Source: Ambulatory Visit | Attending: Orthopedic Surgery | Admitting: Orthopedic Surgery

## 2016-05-30 DIAGNOSIS — Z01812 Encounter for preprocedural laboratory examination: Secondary | ICD-10-CM | POA: Diagnosis not present

## 2016-05-30 DIAGNOSIS — M1612 Unilateral primary osteoarthritis, left hip: Secondary | ICD-10-CM | POA: Insufficient documentation

## 2016-05-30 DIAGNOSIS — Z01810 Encounter for preprocedural cardiovascular examination: Secondary | ICD-10-CM | POA: Insufficient documentation

## 2016-05-30 DIAGNOSIS — I10 Essential (primary) hypertension: Secondary | ICD-10-CM | POA: Insufficient documentation

## 2016-05-30 HISTORY — DX: Asymptomatic varicose veins of bilateral lower extremities: I83.93

## 2016-05-30 HISTORY — DX: Irritable bowel syndrome, unspecified: K58.9

## 2016-05-30 HISTORY — DX: Pneumonia, unspecified organism: J18.9

## 2016-05-30 LAB — COMPREHENSIVE METABOLIC PANEL
ALBUMIN: 4.1 g/dL (ref 3.5–5.0)
ALK PHOS: 64 U/L (ref 38–126)
ALT: 25 U/L (ref 14–54)
AST: 43 U/L — AB (ref 15–41)
Anion gap: 8 (ref 5–15)
BILIRUBIN TOTAL: 0.6 mg/dL (ref 0.3–1.2)
BUN: 12 mg/dL (ref 6–20)
CALCIUM: 9.6 mg/dL (ref 8.9–10.3)
CO2: 28 mmol/L (ref 22–32)
Chloride: 98 mmol/L — ABNORMAL LOW (ref 101–111)
Creatinine, Ser: 0.85 mg/dL (ref 0.44–1.00)
GFR calc Af Amer: >60 mL/min (ref >60)
GFR calc non Af Amer: >60 mL/min (ref >60)
GLUCOSE: 114 mg/dL — AB (ref 65–99)
Potassium: 4.1 mmol/L (ref 3.5–5.1)
Sodium: 134 mmol/L — ABNORMAL LOW (ref 135–145)
TOTAL PROTEIN: 7.9 g/dL (ref 6.5–8.1)

## 2016-05-30 LAB — URINALYSIS, ROUTINE W REFLEX MICROSCOPIC
BILIRUBIN URINE: NEGATIVE
Glucose, UA: NEGATIVE mg/dL
Hgb urine dipstick: NEGATIVE
Ketones, ur: NEGATIVE mg/dL
Leukocytes, UA: NEGATIVE
NITRITE: NEGATIVE
PROTEIN: NEGATIVE mg/dL
SPECIFIC GRAVITY, URINE: 1.018 (ref 1.005–1.030)
pH: 7.5 (ref 5.0–8.0)

## 2016-05-30 LAB — CBC
HEMATOCRIT: 40.9 % (ref 36.0–46.0)
HEMOGLOBIN: 13.8 g/dL (ref 12.0–15.0)
MCH: 32.1 pg (ref 26.0–34.0)
MCHC: 33.7 g/dL (ref 30.0–36.0)
MCV: 95.1 fL (ref 78.0–100.0)
Platelets: 256 10*3/uL (ref 150–400)
RBC: 4.3 MIL/uL (ref 3.87–5.11)
RDW: 14.4 % (ref 11.5–15.5)
WBC: 5.9 10*3/uL (ref 4.0–10.5)

## 2016-05-30 LAB — SURGICAL PCR SCREEN
MRSA, PCR: NEGATIVE
STAPHYLOCOCCUS AUREUS: NEGATIVE

## 2016-05-30 LAB — APTT: APTT: 33 s (ref 24–36)

## 2016-05-30 LAB — ABO/RH: ABO/RH(D): A POS

## 2016-05-30 LAB — PROTIME-INR
INR: 0.99
Prothrombin Time: 13.1 seconds (ref 11.4–15.2)

## 2016-06-04 ENCOUNTER — Ambulatory Visit: Payer: Self-pay | Admitting: Orthopedic Surgery

## 2016-06-04 NOTE — H&P (Signed)
Morgan Elliott DOB: 1946-08-23 Married / Language: English / Race: [place] Female Date of Admission:  06/05/2016 CC:  Left Hip Pain History of Present Illness The patient is a 70 year old female who comes in for a preoperative History and Physical. The patient is scheduled for a left total hip arthroplasty (anterior) to be performed by Dr. Dione Plover. Aluisio, MD at [facility] on 06/05/2016. The patient is a 70 year old female who presented for follow up of their hip. The patient is being followed for their left hip pain and osteoarthritis. They are out from IA injection. Symptoms reported include: pain. The patient feels that they are doing poorly and report their pain level to be severe. The following medication has been used for pain control: Hydrocodone. Unfortunately, her hip pain is getting worse. Pains in the groin radiating towards the knee. It is limiting what she can and cannot do. She is limping more. Intraarticular injection did not provide a tremendous amount of benefit. She woould like to proceed with surgical intervention at this time. They have been treated conservatively in the past for the above stated problem and despite conservative measures, they continue to have progressive pain and severe functional limitations and dysfunction. They have failed non-operative management including home exercise, medications, and injections. It is felt that they would benefit from undergoing total joint replacement. Risks and benefits of the procedure have been discussed with the patient and they elect to proceed with surgery. There are no active contraindications to surgery such as ongoing infection or rapidly progressive neurological disease.  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 Congestive Heart Failure  Mother. Depression  Mother. First Degree Relatives  reported Heart Disease  Maternal Grandfather, Maternal Grandmother, Mother, Paternal Grandmother. Heart disease in female family member before age 71  Hypertension  Brother, Father, Mother, Sister. Osteoarthritis  Father, Maternal Grandmother, Mother. Osteoporosis  Maternal Grandmother.  Social History  Children  1 Current drinker  12/27/2015: Currently drinks wine 5-7 times per week Current work status  retired Furniture conservator/restorer weekly; does running / walking and gym / [employer] Living situation  live with spouse Marital status  married No history of drug/alcohol rehab  Not under pain contract  Number of flights of stairs before winded  2-3 Tobacco / smoke exposure  12/27/2015: no Tobacco use  Never smoker. 12/27/2015 Advance Directives  Living Will, Healthcare POA  Medication History Hydrocodone-Acetaminophen (5-325MG  Tablet, 1 (one) Tablet Oral every 12 hours as neede for severe pain, Taken starting 05/01/2016) Active. RaNITidine HCl (150MG  Tablet, Oral) Active. Aspirin (81MG  Tablet, Oral) Active. Fish Oil (1000MG  Capsule, Oral) Active. Multivitamin (Oral) Active. Probiotic (Oral) Active. Sonata (5MG  Capsule, Oral) Active. Flonase (50MCG/ACT Suspension, Nasal) Active. Losartan Potassium (50MG  Tablet, Oral) Active. Vitamin D (2000UNIT Tablet, Oral) Active. Calcium (Oral) Specific strength unknown - Active. Citalopram Hydrobromide (20MG  Tablet, Oral) Active. AmLODIPine Besylate (5MG  Tablet, Oral) Active. ZyrTEC (10MG  Tablet, Oral) Active.  Past Surgical History Arthroscopy of Knee  left Breast  Biopsy  right, multiple times Breast Mass; Local Excision  right Dilation and Curettage of  Uterus - Multiple  Neck Disc Surgery  Spinal Fusion  neck  Review of Systems General Not Present- Chills, Fatigue, Fever, Memory Loss, Night Sweats, Weight Gain and Weight Loss. Skin Not Present- Eczema, Hives, Itching, Lesions and Rash. HEENT Not Present- Dentures, Double Vision, Headache, Hearing Loss, Tinnitus and Visual Loss. Respiratory Not Present- Allergies, Chronic Cough, Coughing up blood, Shortness of breath at rest and Shortness of breath with exertion. Cardiovascular Not Present- Chest Pain, Difficulty Breathing Lying Down, Murmur, Palpitations, Racing/skipping heartbeats and Swelling. Gastrointestinal Present- Diarrhea. Not Present- Abdominal Pain, Bloody Stool, Constipation, Difficulty Swallowing, Heartburn, Jaundice, Loss of appetitie, Nausea and Vomiting. Female Genitourinary Not Present- Blood in Urine, Discharge, Flank Pain, Incontinence, Painful Urination, Urgency, Urinary frequency, Urinary Retention, Urinating at Night and Weak urinary stream. Musculoskeletal Present- Joint Pain, Joint Swelling, Morning Stiffness and Muscle Weakness. Not Present- Back Pain, Muscle Pain and Spasms. Neurological Not Present- Blackout spells, Difficulty with balance, Dizziness, Paralysis, Tremor and Weakness. Psychiatric Not Present- Insomnia.  Vitals Weight: 163 lb Height: 67in Weight was reported by patient. Height was reported by patient. Body Surface Area: 1.85 m Body Mass Index: 25.53 kg/m  Pulse: 76 (Regular)  BP: 126/78 (Sitting, Left Arm, Standard)  Physical Exam  General Mental Status -Alert, cooperative and good historian. General Appearance-pleasant, Not in acute distress. Orientation-Oriented X3. Build & Nutrition-Well nourished and Well developed.  Head and Neck Head-normocephalic, atraumatic . Neck Global Assessment - supple, no bruit auscultated on  the right, no bruit auscultated on the left.  Eye Vision-Wears corrective lenses. Pupil - Bilateral-Regular and Round. Motion - Bilateral-EOMI.  Chest and Lung Exam Auscultation Breath sounds - clear at anterior chest wall and clear at posterior chest wall. Adventitious sounds - No Adventitious sounds.  Cardiovascular Auscultation Rhythm - Regular rate and rhythm. Heart Sounds - S1 WNL and S2 WNL. Murmurs & Other Heart Sounds - Auscultation of the heart reveals - No Murmurs.  Abdomen Palpation/Percussion Tenderness - Abdomen is non-tender to palpation. Rigidity (guarding) - Abdomen is soft. Auscultation Auscultation of the abdomen reveals - Bowel sounds normal.  Female Genitourinary Note: Not done, not pertinent to present illness   Musculoskeletal Note: On exam, she is alert and oriented, in no apparent distress. Her left hip can be flexed to 100, no internal rotation, about 10 to 20 of external rotation, 20 abduction.  Radiographs are reviewed. She has got bone-on-bone arthritis in the left hip with subchondral cystic formation.   Assessment & Plan  Primary osteoarthritis of left hip (M16.12)  Note:Surgical Plans: Left Total Hip Replacement - Anterior Approach  Disposition: Home  PCP: Dr. Leonides Schanz  Topical TXA - Breast Cancer  Anesthesia Issues: None  Signed electronically by Ok Edwards, III PA-C

## 2016-06-05 ENCOUNTER — Inpatient Hospital Stay (HOSPITAL_COMMUNITY)
Admission: RE | Admit: 2016-06-05 | Discharge: 2016-06-06 | DRG: 470 | Disposition: A | Discharge status: Home Health | Payer: Medicare Other | Source: Ambulatory Visit | Attending: Orthopedic Surgery | Admitting: Orthopedic Surgery

## 2016-06-05 ENCOUNTER — Encounter (HOSPITAL_COMMUNITY)
Admission: RE | Disposition: A | Discharge status: Home Health | Payer: Self-pay | Source: Ambulatory Visit | Attending: Orthopedic Surgery

## 2016-06-05 ENCOUNTER — Encounter (HOSPITAL_COMMUNITY): Payer: Self-pay | Admitting: *Deleted

## 2016-06-05 ENCOUNTER — Inpatient Hospital Stay (HOSPITAL_COMMUNITY): Payer: Medicare Other

## 2016-06-05 ENCOUNTER — Inpatient Hospital Stay (HOSPITAL_COMMUNITY): Payer: Medicare Other | Admitting: Anesthesiology

## 2016-06-05 DIAGNOSIS — Z79899 Other long term (current) drug therapy: Secondary | ICD-10-CM

## 2016-06-05 DIAGNOSIS — Z96642 Presence of left artificial hip joint: Secondary | ICD-10-CM | POA: Diagnosis not present

## 2016-06-05 DIAGNOSIS — M1612 Unilateral primary osteoarthritis, left hip: Principal | ICD-10-CM | POA: Diagnosis present

## 2016-06-05 DIAGNOSIS — Z471 Aftercare following joint replacement surgery: Secondary | ICD-10-CM | POA: Diagnosis not present

## 2016-06-05 DIAGNOSIS — M4322 Fusion of spine, cervical region: Secondary | ICD-10-CM | POA: Diagnosis present

## 2016-06-05 DIAGNOSIS — Z853 Personal history of malignant neoplasm of breast: Secondary | ICD-10-CM | POA: Diagnosis not present

## 2016-06-05 DIAGNOSIS — I129 Hypertensive chronic kidney disease with stage 1 through stage 4 chronic kidney disease, or unspecified chronic kidney disease: Secondary | ICD-10-CM | POA: Diagnosis present

## 2016-06-05 DIAGNOSIS — Z7982 Long term (current) use of aspirin: Secondary | ICD-10-CM | POA: Diagnosis not present

## 2016-06-05 DIAGNOSIS — F419 Anxiety disorder, unspecified: Secondary | ICD-10-CM | POA: Diagnosis present

## 2016-06-05 DIAGNOSIS — N183 Chronic kidney disease, stage 3 (moderate): Secondary | ICD-10-CM | POA: Diagnosis present

## 2016-06-05 DIAGNOSIS — Z96649 Presence of unspecified artificial hip joint: Secondary | ICD-10-CM

## 2016-06-05 DIAGNOSIS — I739 Peripheral vascular disease, unspecified: Secondary | ICD-10-CM | POA: Diagnosis present

## 2016-06-05 DIAGNOSIS — K219 Gastro-esophageal reflux disease without esophagitis: Secondary | ICD-10-CM | POA: Diagnosis present

## 2016-06-05 DIAGNOSIS — K449 Diaphragmatic hernia without obstruction or gangrene: Secondary | ICD-10-CM | POA: Diagnosis not present

## 2016-06-05 DIAGNOSIS — M8580 Other specified disorders of bone density and structure, unspecified site: Secondary | ICD-10-CM | POA: Diagnosis present

## 2016-06-05 DIAGNOSIS — M25552 Pain in left hip: Secondary | ICD-10-CM | POA: Diagnosis not present

## 2016-06-05 DIAGNOSIS — M169 Osteoarthritis of hip, unspecified: Secondary | ICD-10-CM | POA: Diagnosis present

## 2016-06-05 HISTORY — PX: TOTAL HIP ARTHROPLASTY: SHX124

## 2016-06-05 LAB — TYPE AND SCREEN
ABO/RH(D): A POS
Antibody Screen: NEGATIVE

## 2016-06-05 SURGERY — ARTHROPLASTY, HIP, TOTAL, ANTERIOR APPROACH
Anesthesia: Monitor Anesthesia Care | Site: Hip | Laterality: Left

## 2016-06-05 MED ORDER — PROPOFOL 10 MG/ML IV BOLUS
INTRAVENOUS | Status: AC
  Filled 2016-06-05: qty 20

## 2016-06-05 MED ORDER — ACETAMINOPHEN 325 MG PO TABS: 650.0000 mg | ORAL_TABLET | Freq: Four times a day (QID) | ORAL | Status: DC | PRN

## 2016-06-05 MED ORDER — ROCURONIUM BROMIDE 100 MG/10ML IV SOLN
INTRAVENOUS | Status: AC
  Filled 2016-06-05: qty 1

## 2016-06-05 MED ORDER — FENTANYL CITRATE (PF) 100 MCG/2ML IJ SOLN
INTRAMUSCULAR | Status: AC
  Filled 2016-06-05: qty 2

## 2016-06-05 MED ORDER — PHENYLEPHRINE HCL 10 MG/ML IJ SOLN
INTRAVENOUS | Status: DC | PRN
  Administered 2016-06-05: 30 ug/min via INTRAVENOUS

## 2016-06-05 MED ORDER — METHOCARBAMOL 1000 MG/10ML IJ SOLN
500.0000 mg | Freq: Four times a day (QID) | INTRAVENOUS | Status: DC | PRN
  Administered 2016-06-05: 500 mg via INTRAVENOUS
  Filled 2016-06-05: qty 5
  Filled 2016-06-05: qty 550

## 2016-06-05 MED ORDER — DEXAMETHASONE SODIUM PHOSPHATE 10 MG/ML IJ SOLN
INTRAMUSCULAR | Status: AC
  Filled 2016-06-05: qty 1

## 2016-06-05 MED ORDER — OXYCODONE HCL 5 MG/5ML PO SOLN
5.0000 mg | Freq: Once | ORAL | Status: DC | PRN
  Filled 2016-06-05: qty 5

## 2016-06-05 MED ORDER — ONDANSETRON HCL 4 MG PO TABS: 4.0000 mg | ORAL_TABLET | Freq: Four times a day (QID) | ORAL | Status: DC | PRN

## 2016-06-05 MED ORDER — BISACODYL 10 MG RE SUPP: 10.0000 mg | Freq: Every day | RECTAL | Status: DC | PRN

## 2016-06-05 MED ORDER — PHENYLEPHRINE HCL 10 MG/ML IJ SOLN
INTRAMUSCULAR | Status: AC
  Filled 2016-06-05: qty 1

## 2016-06-05 MED ORDER — TRANEXAMIC ACID 1000 MG/10ML IV SOLN
2000.0000 mg | Freq: Once | INTRAVENOUS | Status: DC
  Filled 2016-06-05: qty 20

## 2016-06-05 MED ORDER — LOPERAMIDE HCL 2 MG PO CAPS: 2.0000 mg | ORAL_CAPSULE | ORAL | Status: DC | PRN

## 2016-06-05 MED ORDER — METOCLOPRAMIDE HCL 5 MG/ML IJ SOLN: 5.0000 mg | Freq: Three times a day (TID) | INTRAMUSCULAR | Status: DC | PRN

## 2016-06-05 MED ORDER — ACETAMINOPHEN 500 MG PO TABS
1000.0000 mg | ORAL_TABLET | Freq: Four times a day (QID) | ORAL | Status: AC
  Administered 2016-06-05 – 2016-06-06 (×4): 1000 mg via ORAL
  Filled 2016-06-05 (×4): qty 2

## 2016-06-05 MED ORDER — ACETAMINOPHEN 650 MG RE SUPP: 650.0000 mg | Freq: Four times a day (QID) | RECTAL | Status: DC | PRN

## 2016-06-05 MED ORDER — FENTANYL CITRATE (PF) 100 MCG/2ML IJ SOLN: 25.0000 ug | INTRAMUSCULAR | Status: DC | PRN

## 2016-06-05 MED ORDER — FENTANYL CITRATE (PF) 100 MCG/2ML IJ SOLN
INTRAMUSCULAR | Status: DC | PRN
  Administered 2016-06-05: 100 ug via INTRAVENOUS

## 2016-06-05 MED ORDER — ONDANSETRON HCL 4 MG/2ML IJ SOLN
INTRAMUSCULAR | Status: AC
  Filled 2016-06-05: qty 2

## 2016-06-05 MED ORDER — MIDAZOLAM HCL 5 MG/5ML IJ SOLN
INTRAMUSCULAR | Status: DC | PRN
  Administered 2016-06-05: 2 mg via INTRAVENOUS

## 2016-06-05 MED ORDER — BUPIVACAINE HCL (PF) 0.25 % IJ SOLN
INTRAMUSCULAR | Status: AC
  Filled 2016-06-05: qty 30

## 2016-06-05 MED ORDER — FLUTICASONE PROPIONATE 50 MCG/ACT NA SUSP: 1.0000 | Freq: Every day | NASAL | Status: DC | PRN

## 2016-06-05 MED ORDER — METHOCARBAMOL 500 MG PO TABS
500.0000 mg | ORAL_TABLET | Freq: Four times a day (QID) | ORAL | Status: DC | PRN
  Administered 2016-06-05 – 2016-06-06 (×3): 500 mg via ORAL
  Filled 2016-06-05 (×3): qty 1

## 2016-06-05 MED ORDER — MENTHOL 3 MG MT LOZG: 1.0000 | LOZENGE | OROMUCOSAL | Status: DC | PRN

## 2016-06-05 MED ORDER — LOSARTAN POTASSIUM 25 MG PO TABS
25.0000 mg | ORAL_TABLET | Freq: Every day | ORAL | Status: DC
  Filled 2016-06-05: qty 1

## 2016-06-05 MED ORDER — PROPOFOL 500 MG/50ML IV EMUL
INTRAVENOUS | Status: DC | PRN
  Administered 2016-06-05: 100 ug/kg/min via INTRAVENOUS

## 2016-06-05 MED ORDER — DOCUSATE SODIUM 100 MG PO CAPS
100.0000 mg | ORAL_CAPSULE | Freq: Two times a day (BID) | ORAL | Status: DC
  Administered 2016-06-05 – 2016-06-06 (×2): 100 mg via ORAL
  Filled 2016-06-05 (×2): qty 1

## 2016-06-05 MED ORDER — CEFAZOLIN SODIUM-DEXTROSE 2-4 GM/100ML-% IV SOLN
2.0000 g | Freq: Four times a day (QID) | INTRAVENOUS | Status: AC
  Administered 2016-06-05 (×2): 2 g via INTRAVENOUS
  Filled 2016-06-05: qty 100

## 2016-06-05 MED ORDER — SODIUM CHLORIDE 0.9 % IV SOLN
INTRAVENOUS | Status: DC
  Administered 2016-06-05: 19:00:00 via INTRAVENOUS

## 2016-06-05 MED ORDER — PHENYLEPHRINE HCL 10 MG/ML IJ SOLN
INTRAMUSCULAR | Status: DC | PRN
  Administered 2016-06-05 (×5): 80 ug via INTRAVENOUS

## 2016-06-05 MED ORDER — LACTATED RINGERS IV SOLN
INTRAVENOUS | Status: DC
  Administered 2016-06-05: 1000 mL via INTRAVENOUS
  Administered 2016-06-05: 14:00:00 via INTRAVENOUS

## 2016-06-05 MED ORDER — BUPIVACAINE HCL (PF) 0.5 % IJ SOLN
INTRAMUSCULAR | Status: AC
  Filled 2016-06-05: qty 30

## 2016-06-05 MED ORDER — METOCLOPRAMIDE HCL 5 MG PO TABS: 5.0000 mg | ORAL_TABLET | Freq: Three times a day (TID) | ORAL | Status: DC | PRN

## 2016-06-05 MED ORDER — ACETAMINOPHEN 10 MG/ML IV SOLN
INTRAVENOUS | Status: AC
  Filled 2016-06-05: qty 100

## 2016-06-05 MED ORDER — DEXAMETHASONE SODIUM PHOSPHATE 10 MG/ML IJ SOLN
10.0000 mg | Freq: Once | INTRAMUSCULAR | Status: AC
  Administered 2016-06-05: 10 mg via INTRAVENOUS

## 2016-06-05 MED ORDER — BUPIVACAINE HCL (PF) 0.5 % IJ SOLN
INTRAMUSCULAR | Status: DC | PRN
  Administered 2016-06-05: 15 mg via INTRATHECAL

## 2016-06-05 MED ORDER — CITALOPRAM HYDROBROMIDE 20 MG PO TABS
20.0000 mg | ORAL_TABLET | Freq: Every day | ORAL | Status: DC
Start: 2016-06-06 — End: 2016-06-06
  Administered 2016-06-06: 20 mg via ORAL
  Filled 2016-06-05: qty 1

## 2016-06-05 MED ORDER — FAMOTIDINE 20 MG PO TABS
20.0000 mg | ORAL_TABLET | Freq: Every day | ORAL | Status: DC
  Administered 2016-06-05: 20 mg via ORAL
  Filled 2016-06-05: qty 1

## 2016-06-05 MED ORDER — DEXAMETHASONE SODIUM PHOSPHATE 10 MG/ML IJ SOLN
10.0000 mg | Freq: Once | INTRAMUSCULAR | Status: AC
  Administered 2016-06-06: 10 mg via INTRAVENOUS
  Filled 2016-06-05: qty 1

## 2016-06-05 MED ORDER — BUPIVACAINE HCL (PF) 0.25 % IJ SOLN
INTRAMUSCULAR | Status: DC | PRN
  Administered 2016-06-05: 30 mL

## 2016-06-05 MED ORDER — MORPHINE SULFATE (PF) 2 MG/ML IV SOLN
1.0000 mg | INTRAVENOUS | Status: DC | PRN
Start: 2016-06-05 — End: 2016-06-06
  Administered 2016-06-06: 1 mg via INTRAVENOUS
  Filled 2016-06-05: qty 1

## 2016-06-05 MED ORDER — LIDOCAINE HCL (CARDIAC) 20 MG/ML IV SOLN
INTRAVENOUS | Status: AC
  Filled 2016-06-05: qty 5

## 2016-06-05 MED ORDER — PHENYLEPHRINE 40 MCG/ML (10ML) SYRINGE FOR IV PUSH (FOR BLOOD PRESSURE SUPPORT)
PREFILLED_SYRINGE | INTRAVENOUS | Status: AC
  Filled 2016-06-05: qty 10

## 2016-06-05 MED ORDER — ACETAMINOPHEN 10 MG/ML IV SOLN
1000.0000 mg | Freq: Once | INTRAVENOUS | Status: AC
  Administered 2016-06-05: 1000 mg via INTRAVENOUS

## 2016-06-05 MED ORDER — PHENOL 1.4 % MT LIQD: 1.0000 | OROMUCOSAL | Status: DC | PRN

## 2016-06-05 MED ORDER — MIDAZOLAM HCL 2 MG/2ML IJ SOLN
INTRAMUSCULAR | Status: AC
  Filled 2016-06-05: qty 2

## 2016-06-05 MED ORDER — OXYCODONE HCL 5 MG PO TABS: 5.0000 mg | ORAL_TABLET | Freq: Once | ORAL | Status: DC | PRN

## 2016-06-05 MED ORDER — DIPHENHYDRAMINE HCL 12.5 MG/5ML PO ELIX: 12.5000 mg | ORAL_SOLUTION | ORAL | Status: DC | PRN

## 2016-06-05 MED ORDER — CEFAZOLIN SODIUM-DEXTROSE 2-4 GM/100ML-% IV SOLN
2.0000 g | Freq: Once | INTRAVENOUS | Status: AC
  Administered 2016-06-05: 2 g via INTRAVENOUS

## 2016-06-05 MED ORDER — OXYCODONE HCL 5 MG PO TABS
5.0000 mg | ORAL_TABLET | ORAL | Status: DC | PRN
  Administered 2016-06-05: 5 mg via ORAL
  Administered 2016-06-06 (×5): 10 mg via ORAL
  Filled 2016-06-05: qty 2
  Filled 2016-06-05: qty 1
  Filled 2016-06-05 (×3): qty 2
  Filled 2016-06-05: qty 1
  Filled 2016-06-05: qty 2

## 2016-06-05 MED ORDER — SODIUM CHLORIDE 0.9 % IV SOLN
INTRAVENOUS | Status: DC | PRN
  Administered 2016-06-05: 2000 mg via TOPICAL

## 2016-06-05 MED ORDER — AMLODIPINE BESYLATE 5 MG PO TABS
5.0000 mg | ORAL_TABLET | Freq: Every day | ORAL | Status: DC
  Filled 2016-06-05: qty 1

## 2016-06-05 MED ORDER — STERILE WATER FOR IRRIGATION IR SOLN
Status: DC | PRN
  Administered 2016-06-05: 3000 mL

## 2016-06-05 MED ORDER — ONDANSETRON HCL 4 MG/2ML IJ SOLN: 4.0000 mg | Freq: Four times a day (QID) | INTRAMUSCULAR | Status: DC | PRN

## 2016-06-05 MED ORDER — ONDANSETRON HCL 4 MG/2ML IJ SOLN
INTRAMUSCULAR | Status: DC | PRN
  Administered 2016-06-05: 4 mg via INTRAVENOUS

## 2016-06-05 MED ORDER — LORATADINE 10 MG PO TABS
10.0000 mg | ORAL_TABLET | Freq: Every day | ORAL | Status: DC
  Administered 2016-06-05: 10 mg via ORAL
  Filled 2016-06-05: qty 1

## 2016-06-05 MED ORDER — ZOLPIDEM TARTRATE 5 MG PO TABS
5.0000 mg | ORAL_TABLET | Freq: Every evening | ORAL | Status: DC | PRN
  Administered 2016-06-05: 5 mg via ORAL
  Filled 2016-06-05: qty 1

## 2016-06-05 MED ORDER — POLYETHYLENE GLYCOL 3350 17 G PO PACK: 17.0000 g | PACK | Freq: Every day | ORAL | Status: DC | PRN

## 2016-06-05 MED ORDER — 0.9 % SODIUM CHLORIDE (POUR BTL) OPTIME
TOPICAL | Status: DC | PRN
  Administered 2016-06-05: 1000 mL

## 2016-06-05 MED ORDER — RIVAROXABAN 10 MG PO TABS
10.0000 mg | ORAL_TABLET | Freq: Every day | ORAL | Status: DC
  Administered 2016-06-06: 10 mg via ORAL
  Filled 2016-06-05: qty 1

## 2016-06-05 MED ORDER — CEFAZOLIN SODIUM-DEXTROSE 2-4 GM/100ML-% IV SOLN
INTRAVENOUS | Status: AC
  Filled 2016-06-05: qty 100

## 2016-06-05 MED ORDER — FLEET ENEMA 7-19 GM/118ML RE ENEM: 1.0000 | ENEMA | Freq: Once | RECTAL | Status: DC | PRN

## 2016-06-05 SURGICAL SUPPLY — 35 items
BAG DECANTER FOR FLEXI CONT (MISCELLANEOUS) ×3 IMPLANT
BAG SPEC THK2 15X12 ZIP CLS (MISCELLANEOUS)
BAG ZIPLOCK 12X15 (MISCELLANEOUS) IMPLANT
BLADE SAG 18X100X1.27 (BLADE) ×3 IMPLANT
CAPT HIP TOTAL 2 ×2 IMPLANT
CLOSURE WOUND 1/2 X4 (GAUZE/BANDAGES/DRESSINGS) ×1
CLOTH BEACON ORANGE TIMEOUT ST (SAFETY) ×3 IMPLANT
COVER PERINEAL POST (MISCELLANEOUS) ×3 IMPLANT
DECANTER SPIKE VIAL GLASS SM (MISCELLANEOUS) ×3 IMPLANT
DRAPE STERI IOBAN 125X83 (DRAPES) ×3 IMPLANT
DRAPE U-SHAPE 47X51 STRL (DRAPES) ×6 IMPLANT
DRSG ADAPTIC 3X8 NADH LF (GAUZE/BANDAGES/DRESSINGS) ×3 IMPLANT
DRSG MEPILEX BORDER 4X4 (GAUZE/BANDAGES/DRESSINGS) ×3 IMPLANT
DRSG MEPILEX BORDER 4X8 (GAUZE/BANDAGES/DRESSINGS) ×3 IMPLANT
DURAPREP 26ML APPLICATOR (WOUND CARE) ×3 IMPLANT
ELECT REM PT RETURN 9FT ADLT (ELECTROSURGICAL) ×3
ELECTRODE REM PT RTRN 9FT ADLT (ELECTROSURGICAL) ×1 IMPLANT
EVACUATOR 1/8 PVC DRAIN (DRAIN) ×3 IMPLANT
GLOVE BIO SURGEON STRL SZ7.5 (GLOVE) ×3 IMPLANT
GLOVE BIO SURGEON STRL SZ8 (GLOVE) ×6 IMPLANT
GLOVE BIOGEL PI IND STRL 8 (GLOVE) ×2 IMPLANT
GLOVE BIOGEL PI INDICATOR 8 (GLOVE) ×16
GOWN STRL REUS W/TWL LRG LVL3 (GOWN DISPOSABLE) ×3 IMPLANT
GOWN STRL REUS W/TWL XL LVL3 (GOWN DISPOSABLE) ×3 IMPLANT
PACK ANTERIOR HIP CUSTOM (KITS) ×3 IMPLANT
STRIP CLOSURE SKIN 1/2X4 (GAUZE/BANDAGES/DRESSINGS) ×2 IMPLANT
SUT ETHIBOND NAB CT1 #1 30IN (SUTURE) ×3 IMPLANT
SUT MNCRL AB 4-0 PS2 18 (SUTURE) ×3 IMPLANT
SUT VIC AB 2-0 CT1 27 (SUTURE) ×6
SUT VIC AB 2-0 CT1 TAPERPNT 27 (SUTURE) ×2 IMPLANT
SUT VLOC 180 0 24IN GS25 (SUTURE) ×3 IMPLANT
SYR 50ML LL SCALE MARK (SYRINGE) IMPLANT
TRAY FOLEY W/METER SILVER 14FR (SET/KITS/TRAYS/PACK) ×3 IMPLANT
TRAY FOLEY W/METER SILVER 16FR (SET/KITS/TRAYS/PACK) ×1 IMPLANT
YANKAUER SUCT BULB TIP 10FT TU (MISCELLANEOUS) ×3 IMPLANT

## 2016-06-05 NOTE — Op Note (Signed)
OPERATIVE REPORT- TOTAL HIP ARTHROPLASTY   PREOPERATIVE DIAGNOSIS: Osteoarthritis of the Left hip.   POSTOPERATIVE DIAGNOSIS: Osteoarthritis of the Left  hip.   PROCEDURE: Left total hip arthroplasty, anterior approach.   SURGEON: Gaynelle Arabian, MD   ASSISTANT: Arlee Muslim, PA-C  ANESTHESIA:  Spinal  ESTIMATED BLOOD LOSS:- 200 ml    DRAINS: Hemovac x1.   COMPLICATIONS: None   CONDITION: PACU - hemodynamically stable.   BRIEF CLINICAL NOTE: Morgan Elliott is a 70 y.o. female who has advanced end-  stage arthritis of their Left  hip with progressively worsening pain and  dysfunction.The patient has failed nonoperative management and presents for  total hip arthroplasty.   PROCEDURE IN DETAIL: After successful administration of spinal  anesthetic, the traction boots for the [facility] bed were placed on both  feet and the patient was placed onto the [facility] bed, boots placed into the leg  holders. The Left hip was then isolated from the perineum with plastic  drapes and prepped and draped in the usual sterile fashion. ASIS and  greater trochanter were marked and a oblique incision was made, starting  at about 1 cm lateral and 2 cm distal to the ASIS and coursing towards  the anterior cortex of the femur. The skin was cut with a 10 blade  through subcutaneous tissue to the level of the fascia overlying the  tensor fascia lata muscle. The fascia was then incised in line with the  incision at the junction of the anterior third and posterior 2/3rd. The  muscle was teased off the fascia and then the interval between the TFL  and the rectus was developed. The Hohmann retractor was then placed at  the top of the femoral neck over the capsule. The vessels overlying the  capsule were cauterized and the fat on top of the capsule was removed.  A Hohmann retractor was then placed anterior underneath the rectus  femoris to give exposure to the entire anterior capsule. A T-shaped   capsulotomy was performed. The edges were tagged and the femoral head  was identified.       Osteophytes are removed off the superior acetabulum.  The femoral neck was then cut in situ with an oscillating saw. Traction  was then applied to the left lower extremity utilizing the [facility]  traction. The femoral head was then removed. Retractors were placed  around the acetabulum and then circumferential removal of the labrum was  performed. Osteophytes were also removed. Reaming starts at 45 mm to  medialize and  Increased in 2 mm increments to 49 mm. We reamed in  approximately 40 degrees of abduction, 20 degrees anteversion. A 50 mm  pinnacle acetabular shell was then impacted in anatomic position under  fluoroscopic guidance with excellent purchase. We did not need to place  any additional dome screws. A 32 mm neutral + 4 marathon liner was then  placed into the acetabular shell.       The femoral lift was then placed along the lateral aspect of the femur  just distal to the vastus ridge. The leg was  externally rotated and capsule  was stripped off the inferior aspect of the femoral neck down to the  level of the lesser trochanter, this was done with electrocautery. The femur was lifted after this was performed. The  leg was then placed in an extended and adducted position essentially delivering the femur. We also removed the capsule superiorly and the piriformis from the  piriformis fossa to gain excellent exposure of the  proximal femur. Rongeur was used to remove some cancellous bone to get  into the lateral portion of the proximal femur for placement of the  initial starter reamer. The starter broaches was placed  the starter broach  and was shown to go down the center of the canal. Broaching  with the  [place] system was then performed starting at size 8, coursing  Up to size 12. A size 12 had excellent torsional and rotational  and axial stability. The trial standard offset neck was then  placed  with a 32 + 5 trial head. The hip was then reduced. We confirmed that  the stem was in the canal both on AP and lateral x-rays. It also has excellent sizing. The hip was reduced with outstanding stability through full extension and full external rotation.. AP pelvis was taken and the leg lengths were measured and found to be equal. Hip was then dislocated again and the femoral head and neck removed. The  femoral broach was removed. Size 12 [place] stem with a standard offset  neck was then impacted into the femur following native anteversion. Has  excellent purchase in the canal. Excellent torsional and rotational and  axial stability. It is confirmed to be in the canal on AP and lateral  fluoroscopic views. The 32 + 5 ceramic head was placed and the hip  reduced with outstanding stability. Again AP pelvis was taken and it  confirmed that the leg lengths were equal. The wound was then copiously  irrigated with saline solution and the capsule reattached and repaired  with Ethibond suture. 30 ml of .25% Bupivicaine was  injected into the capsule and into the edge of the tensor fascia lata as well as subcutaneous tissue. The fascia overlying the tensor fascia lata was then closed with a running #1 V-Loc. Subcu was closed with interrupted 2-0 Vicryl and subcuticular running 4-0 Monocryl. Incision was cleaned  and dried. Steri-Strips and a bulky sterile dressing applied. Hemovac  drain was hooked to suction and then the patient was awakened and transported to  recovery in stable condition.        Please note that a surgical assistant was a medical necessity for this procedure to perform it in a safe and expeditious manner. Assistant was necessary to provide appropriate retraction of vital neurovascular structures and to prevent femoral fracture and allow for anatomic placement of the prosthesis.  Gaynelle Arabian, M.D.

## 2016-06-05 NOTE — Anesthesia Postprocedure Evaluation (Signed)
Anesthesia Post Note  Patient: Morgan Elliott  Procedure(s) Performed: Procedure(s) (LRB): LEFT TOTAL HIP ARTHROPLASTY ANTERIOR APPROACH (Left)  Patient location during evaluation: PACU Anesthesia Type: Spinal Level of consciousness: oriented and awake and alert Pain management: pain level controlled Vital Signs Assessment: post-procedure vital signs reviewed and stable Respiratory status: spontaneous breathing, respiratory function stable and patient connected to nasal cannula oxygen Cardiovascular status: blood pressure returned to baseline and stable Postop Assessment: no headache and no backache Anesthetic complications: no    Last Vitals:  Vitals:   06/05/16 1721 06/05/16 1730  BP: 129/82 129/82  Pulse: 60 60  Resp:  16  Temp: 36.4 C 36.4 C    Last Pain:  Vitals:   06/05/16 1730  TempSrc: Oral                 Yuri Flener S

## 2016-06-05 NOTE — Anesthesia Preprocedure Evaluation (Signed)
Anesthesia Evaluation  Patient identified by MRN, date of birth, ID band Patient awake    Reviewed: Allergy & Precautions, H&P , NPO status , Patient's Chart, lab work & pertinent test results  Airway Mallampati: II   Neck ROM: full    Dental   Pulmonary    breath sounds clear to auscultation       Cardiovascular hypertension, + Peripheral Vascular Disease   Rhythm:regular Rate:Normal     Neuro/Psych Anxiety Depression    GI/Hepatic hiatal hernia, GERD  ,  Endo/Other    Renal/GU Renal InsufficiencyRenal disease     Musculoskeletal  (+) Arthritis ,   Abdominal   Peds  Hematology   Anesthesia Other Findings   Reproductive/Obstetrics                             Anesthesia Physical Anesthesia Plan  ASA: II  Anesthesia Plan: MAC and Spinal   Post-op Pain Management:    Induction: Intravenous  Airway Management Planned: Simple Face Mask  Additional Equipment:   Intra-op Plan:   Post-operative Plan:   Informed Consent: I have reviewed the patients History and Physical, chart, labs and discussed the procedure including the risks, benefits and alternatives for the proposed anesthesia with the patient or authorized representative who has indicated his/her understanding and acceptance.     Plan Discussed with: CRNA, Anesthesiologist and Surgeon  Anesthesia Plan Comments:         Anesthesia Quick Evaluation

## 2016-06-05 NOTE — Transfer of Care (Signed)
Immediate Anesthesia Transfer of Care Note  Patient: Morgan Elliott  Procedure(s) Performed: Procedure(s): LEFT TOTAL HIP ARTHROPLASTY ANTERIOR APPROACH (Left)  Patient Location: PACU  Anesthesia Type:Spinal  Level of Consciousness: sedated  Airway & Oxygen Therapy: Patient Spontanous Breathing and Patient connected to face mask oxygen  Post-op Assessment: Report given to RN and Post -op Vital signs reviewed and stable  Post vital signs: Reviewed and stable  Last Vitals:  Vitals:   06/05/16 1054  BP: 125/84  Pulse: 86  Resp: 16  Temp: 36.7 C    Last Pain:  Vitals:   06/05/16 1054  TempSrc: Oral      Patients Stated Pain Goal: 4 ([id number])  Complications: No apparent anesthesia complications

## 2016-06-05 NOTE — Interval H&P Note (Signed)
History and Physical Interval Note:  06/05/2016 1:29 PM  Morgan Elliott  has presented today for surgery, with the diagnosis of LEFT HIP OSEOARTHRITIS  The various methods of treatment have been discussed with the patient and family. After consideration of risks, benefits and other options for treatment, the patient has consented to  Procedure(s): LEFT TOTAL HIP ARTHROPLASTY ANTERIOR APPROACH (Left) as a surgical intervention .  The patient's history has been reviewed, patient examined, no change in status, stable for surgery.  I have reviewed the patient's chart and labs.  Questions were answered to the patient's satisfaction.     Gearlean Alf

## 2016-06-05 NOTE — H&P (View-Only) (Signed)
Morgan Elliott DOB: October 11, 1946 Married / Language: English / Race: [place] Female Date of Admission:  06/05/2016 CC:  Left Hip Pain History of Present Illness The patient is a 70 year old female who comes in for a preoperative History and Physical. The patient is scheduled for a left total hip arthroplasty (anterior) to be performed by Dr. Dione Plover. Aluisio, MD at [facility] on 06/05/2016. The patient is a 70 year old female who presented for follow up of their hip. The patient is being followed for their left hip pain and osteoarthritis. They are out from IA injection. Symptoms reported include: pain. The patient feels that they are doing poorly and report their pain level to be severe. The following medication has been used for pain control: Hydrocodone. Unfortunately, her hip pain is getting worse. Pains in the groin radiating towards the knee. It is limiting what she can and cannot do. She is limping more. Intraarticular injection did not provide a tremendous amount of benefit. She woould like to proceed with surgical intervention at this time. They have been treated conservatively in the past for the above stated problem and despite conservative measures, they continue to have progressive pain and severe functional limitations and dysfunction. They have failed non-operative management including home exercise, medications, and injections. It is felt that they would benefit from undergoing total joint replacement. Risks and benefits of the procedure have been discussed with the patient and they elect to proceed with surgery. There are no active contraindications to surgery such as ongoing infection or rapidly progressive neurological disease.  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 Congestive Heart Failure  Mother. Depression  Mother. First Degree Relatives  reported Heart Disease  Maternal Grandfather, Maternal Grandmother, Mother, Paternal Grandmother. Heart disease in female family member before age 45  Hypertension  Brother, Father, Mother, Sister. Osteoarthritis  Father, Maternal Grandmother, Mother. Osteoporosis  Maternal Grandmother.  Social History  Children  1 Current drinker  12/27/2015: Currently drinks wine 5-7 times per week Current work status  retired Furniture conservator/restorer weekly; does running / walking and gym / [employer] Living situation  live with spouse Marital status  married No history of drug/alcohol rehab  Not under pain contract  Number of flights of stairs before winded  2-3 Tobacco / smoke exposure  12/27/2015: no Tobacco use  Never smoker. 12/27/2015 Advance Directives  Living Will, Healthcare POA  Medication History Hydrocodone-Acetaminophen (5-325MG  Tablet, 1 (one) Tablet Oral every 12 hours as neede for severe pain, Taken starting 05/01/2016) Active. RaNITidine HCl (150MG  Tablet, Oral) Active. Aspirin (81MG  Tablet, Oral) Active. Fish Oil (1000MG  Capsule, Oral) Active. Multivitamin (Oral) Active. Probiotic (Oral) Active. Sonata (5MG  Capsule, Oral) Active. Flonase (50MCG/ACT Suspension, Nasal) Active. Losartan Potassium (50MG  Tablet, Oral) Active. Vitamin D (2000UNIT Tablet, Oral) Active. Calcium (Oral) Specific strength unknown - Active. Citalopram Hydrobromide (20MG  Tablet, Oral) Active. AmLODIPine Besylate (5MG  Tablet, Oral) Active. ZyrTEC (10MG  Tablet, Oral) Active.  Past Surgical History Arthroscopy of Knee  left Breast  Biopsy  right, multiple times Breast Mass; Local Excision  right Dilation and Curettage of  Uterus - Multiple  Neck Disc Surgery  Spinal Fusion  neck  Review of Systems General Not Present- Chills, Fatigue, Fever, Memory Loss, Night Sweats, Weight Gain and Weight Loss. Skin Not Present- Eczema, Hives, Itching, Lesions and Rash. HEENT Not Present- Dentures, Double Vision, Headache, Hearing Loss, Tinnitus and Visual Loss. Respiratory Not Present- Allergies, Chronic Cough, Coughing up blood, Shortness of breath at rest and Shortness of breath with exertion. Cardiovascular Not Present- Chest Pain, Difficulty Breathing Lying Down, Murmur, Palpitations, Racing/skipping heartbeats and Swelling. Gastrointestinal Present- Diarrhea. Not Present- Abdominal Pain, Bloody Stool, Constipation, Difficulty Swallowing, Heartburn, Jaundice, Loss of appetitie, Nausea and Vomiting. Female Genitourinary Not Present- Blood in Urine, Discharge, Flank Pain, Incontinence, Painful Urination, Urgency, Urinary frequency, Urinary Retention, Urinating at Night and Weak urinary stream. Musculoskeletal Present- Joint Pain, Joint Swelling, Morning Stiffness and Muscle Weakness. Not Present- Back Pain, Muscle Pain and Spasms. Neurological Not Present- Blackout spells, Difficulty with balance, Dizziness, Paralysis, Tremor and Weakness. Psychiatric Not Present- Insomnia.  Vitals Weight: 163 lb Height: 67in Weight was reported by patient. Height was reported by patient. Body Surface Area: 1.85 m Body Mass Index: 25.53 kg/m  Pulse: 76 (Regular)  BP: 126/78 (Sitting, Left Arm, Standard)  Physical Exam  General Mental Status -Alert, cooperative and good historian. General Appearance-pleasant, Not in acute distress. Orientation-Oriented X3. Build & Nutrition-Well nourished and Well developed.  Head and Neck Head-normocephalic, atraumatic . Neck Global Assessment - supple, no bruit auscultated on  the right, no bruit auscultated on the left.  Eye Vision-Wears corrective lenses. Pupil - Bilateral-Regular and Round. Motion - Bilateral-EOMI.  Chest and Lung Exam Auscultation Breath sounds - clear at anterior chest wall and clear at posterior chest wall. Adventitious sounds - No Adventitious sounds.  Cardiovascular Auscultation Rhythm - Regular rate and rhythm. Heart Sounds - S1 WNL and S2 WNL. Murmurs & Other Heart Sounds - Auscultation of the heart reveals - No Murmurs.  Abdomen Palpation/Percussion Tenderness - Abdomen is non-tender to palpation. Rigidity (guarding) - Abdomen is soft. Auscultation Auscultation of the abdomen reveals - Bowel sounds normal.  Female Genitourinary Note: Not done, not pertinent to present illness   Musculoskeletal Note: On exam, she is alert and oriented, in no apparent distress. Her left hip can be flexed to 100, no internal rotation, about 10 to 20 of external rotation, 20 abduction.  Radiographs are reviewed. She has got bone-on-bone arthritis in the left hip with subchondral cystic formation.   Assessment & Plan  Primary osteoarthritis of left hip (M16.12)  Note:Surgical Plans: Left Total Hip Replacement - Anterior Approach  Disposition: Home  PCP: Dr. Leonides Schanz  Topical TXA - Breast Cancer  Anesthesia Issues: None  Signed electronically by Morgan Elliott, Morgan Elliott

## 2016-06-06 LAB — CBC
HCT: 35.8 % — ABNORMAL LOW (ref 36.0–46.0)
Hemoglobin: 12.1 g/dL (ref 12.0–15.0)
MCH: 31.8 pg (ref 26.0–34.0)
MCHC: 33.8 g/dL (ref 30.0–36.0)
MCV: 94 fL (ref 78.0–100.0)
Platelets: 218 10*3/uL (ref 150–400)
RBC: 3.81 MIL/uL — ABNORMAL LOW (ref 3.87–5.11)
RDW: 14.1 % (ref 11.5–15.5)
WBC: 10.4 10*3/uL (ref 4.0–10.5)

## 2016-06-06 LAB — BASIC METABOLIC PANEL
Anion gap: 5 (ref 5–15)
BUN: 10 mg/dL (ref 6–20)
CALCIUM: 9 mg/dL (ref 8.9–10.3)
CHLORIDE: 104 mmol/L (ref 101–111)
CO2: 27 mmol/L (ref 22–32)
CREATININE: 0.63 mg/dL (ref 0.44–1.00)
Glucose, Bld: 186 mg/dL — ABNORMAL HIGH (ref 65–99)
Potassium: 5 mmol/L (ref 3.5–5.1)
SODIUM: 136 mmol/L (ref 135–145)

## 2016-06-06 MED ORDER — METHOCARBAMOL 500 MG PO TABS: 500.0000 mg | ORAL_TABLET | Freq: Four times a day (QID) | ORAL | 0 refills | Status: DC | PRN

## 2016-06-06 MED ORDER — OXYCODONE HCL 5 MG PO TABS
5.0000 mg | ORAL_TABLET | ORAL | 0 refills | Status: DC | PRN
Start: 2016-06-06 — End: 2017-04-08

## 2016-06-06 MED ORDER — RIVAROXABAN 10 MG PO TABS: 10.0000 mg | ORAL_TABLET | Freq: Every day | ORAL | 0 refills | Status: DC

## 2016-06-06 NOTE — Progress Notes (Signed)
Subjective: 1 Day Post-Op Procedure(s) (LRB): LEFT TOTAL HIP ARTHROPLASTY ANTERIOR APPROACH (Left) Patient reports pain as mild.   Patient seen in rounds by Dr. Wynelle Link. Patient is well, and has had no acute complaints or problems We will start therapy today.  If they do well with therapy and meets all goals, then will allow home later this afternoon following therapy. Plan is to go Home after hospital stay.  Objective: Vital signs in last 24 hours: Temp:  [97.4 F (36.3 C)-98.4 F (36.9 C)] 97.6 F (36.4 C) (08/17 0652) Pulse Rate:  [56-86] 64 (08/17 0652) Resp:  [11-17] 16 (08/17 0652) BP: (97-139)/(59-84) 139/81 (08/17 0652) SpO2:  [97 %-100 %] 99 % (08/17 0652) Weight:  [74.4 kg (164 lb)] 74.4 kg (164 lb) (08/16 1730)  Intake/Output from previous day:  Intake/Output Summary (Last 24 hours) at 06/06/16 0922 Last data filed at 06/06/16 0841  Gross per 24 hour  Intake             4365 ml  Output             3510 ml  Net              855 ml    Intake/Output this shift: Total I/O In: 240 [P.O.:240] Out: -   Labs:  Recent Labs  06/06/16 0411  HGB 12.1    Recent Labs  06/06/16 0411  WBC 10.4  RBC 3.81*  HCT 35.8*  PLT 218    Recent Labs  06/06/16 0411  NA 136  K 5.0  CL 104  CO2 27  BUN 10  CREATININE 0.63  GLUCOSE 186*  CALCIUM 9.0   No results for input(s): LABPT, INR in the last 72 hours.  EXAM General - Patient is Alert, Appropriate and Oriented Extremity - Neurovascular intact Sensation intact distally Dorsiflexion/Plantar flexion intact Dressing - dressing C/D/I Motor Function - intact, moving foot and toes well on exam.  Hemovac pulled without difficulty.  Past Medical History:  Diagnosis Date  . Anxiety   . Arthritis    knees, hands, hip  . 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 Breast cancer ([place])   . Chronic kidney disease    recent dx of stage 3A Chronic kidney disease  . 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 Depression   . GERD (gastroesophageal reflux disease)   . H/O hiatal hernia   . Hematoma    right breast  . Hypertension    borderline  . IBS (irritable bowel syndrome)   . Osteopenia   . Pneumonia   . SVD (spontaneous vaginal delivery)    x 1  . Varicose veins of both lower extremities     Assessment/Plan: 1 Day Post-Op Procedure(s) (LRB): LEFT TOTAL HIP ARTHROPLASTY ANTERIOR APPROACH (Left) Principal Problem:   OA (osteoarthritis) of hip  Estimated body mass index is 25.69 kg/m as calculated from the following:   Height as of this encounter: 5\' 7"  (1.702 m).   Weight as of this encounter: 74.4 kg (164 lb). Up with therapy Discharge home with home health  DVT Prophylaxis - Xarelto Weight Bearing As Tolerated left Leg Hemovac Pulled Begin Therapy  If meets goals and able to go home: Discharge home with home health Diet - Cardiac diet and Renal diet Follow up - in 2 weeks Activity - WBAT Disposition - Home Condition Upon Discharge - Stable D/C Meds - See DC Summary DVT Prophylaxis - Xarelto  Arlee Muslim, PA-C  Orthopaedic Surgery 06/06/2016, 9:22 AM

## 2016-06-06 NOTE — Evaluation (Signed)
Physical Therapy Evaluation Patient Details Name: Morgan Elliott MRN: [id number] DOB: Mar 23, 1946 Today's Date: 06/06/2016   History of Present Illness  Pt s/p L THR  with hx of Breast CA and cervical fusion  Clinical Impression  Pt s/p L THR presents with decreased L LE strength/ROM and post op pain limiting functional mobility.  Pt should progress to dc home with family assist and HHPT follow up.    Follow Up Recommendations Outpatient PT (PT is scheduled for monday)    Equipment Recommendations  None recommended by PT    Recommendations for Other Services OT consult     Precautions / Restrictions Precautions Precautions: Fall Restrictions Weight Bearing Restrictions: No Other Position/Activity Restrictions: WBAT      Mobility  Bed Mobility Overal bed mobility: Needs Assistance Bed Mobility: Supine to Sit     Supine to sit: Min assist     General bed mobility comments: cues for sequence and use of R LE to self assist  Transfers Overall transfer level: Needs assistance Equipment used: Rolling walker (2 wheeled) Transfers: Sit to/from Stand Sit to Stand: Min assist         General transfer comment: cues for LE management and use of UEs to self assist  Ambulation/Gait Ambulation/Gait assistance: Min assist Ambulation Distance (Feet): 90 Feet Assistive device: Rolling walker (2 wheeled) Gait Pattern/deviations: Step-to pattern;Step-through pattern;Shuffle;Trunk flexed;Decreased step length - right;Decreased step length - left Gait velocity: decr Gait velocity interpretation: Below normal speed for age/gender General Gait Details: cues for sequence, posture and position from [employer]            Wheelchair Mobility    Modified Rankin (Stroke Patients Only)       Balance                                             Pertinent Vitals/Pain Pain Assessment: 0-10 Pain Score: 6  Pain Location: L hip/thigh Pain Descriptors /  Indicators: Aching;Burning Pain Intervention(s): Limited activity within patient's tolerance;Monitored during session;Premedicated before session;Ice applied    Home Living Family/patient expects to be discharged to:: Private residence Living Arrangements: Spouse/significant other Available Help at Discharge: Family Type of Home: House Home Access: Stairs to enter Entrance Stairs-Rails: Right Entrance Stairs-Number of Steps: 3 Home Layout: Able to live on main level with bedroom/bathroom Home Equipment: Walker - 2 wheels;Crutches;Cane - single point      Prior Function Level of Independence: Independent               Hand Dominance        Extremity/Trunk Assessment   Upper Extremity Assessment: Overall WFL for tasks assessed           Lower Extremity Assessment: LLE deficits/detail   LLE Deficits / Details: Strength at hip 2+/5 with AAROM at hip to 90 flex and 20 abd     Communication   Communication: No difficulties  Cognition Arousal/Alertness: Awake/alert Behavior During Therapy: WFL for tasks assessed/performed Overall Cognitive Status: Within Functional Limits for tasks assessed                      General Comments      Exercises Total Joint Exercises Ankle Circles/Pumps: AROM;Both;15 reps;Supine Quad Sets: AROM;Both;10 reps;Supine Heel Slides: AAROM;Left;20 reps;Supine Hip ABduction/ADduction: AAROM;Left;15 reps;Supine      Assessment/Plan    PT Assessment Patient  needs continued PT services  PT Diagnosis Difficulty walking   PT Problem List Decreased strength;Decreased range of motion;Decreased activity tolerance;Decreased mobility;Decreased knowledge of use of DME;Pain;Decreased safety awareness  PT Treatment Interventions DME instruction;Gait training;Stair training;Functional mobility training;Therapeutic activities;Therapeutic exercise;Patient/family education   PT Goals (Current goals can be found in the Care Plan section) Acute  Rehab PT Goals Patient Stated Goal: Regain IND and walk without pain PT Goal Formulation: With patient Time For Goal Achievement: 06/08/16 Potential to Achieve Goals: Good    Frequency 7X/week   Barriers to discharge        Co-evaluation               End of Session Equipment Utilized During Treatment: Gait belt Activity Tolerance: Patient tolerated treatment well Patient left: in chair;with call bell/phone within reach;with chair alarm set Nurse Communication: Mobility status         Time: [id number] PT Time Calculation (min) (ACUTE ONLY): 35 min   Charges:   PT Evaluation $PT Eval Low Complexity: 1 Procedure PT Treatments $Therapeutic Exercise: 8-22 mins   PT G Codes:        Joliana Claflin 06/08/2016, 12:20 PM

## 2016-06-06 NOTE — Evaluation (Signed)
Occupational Therapy Evaluation Patient Details Name: Morgan Elliott MRN: [id number] DOB: [birth year] Today's Date: 06/06/2016    History of Present Illness Pt s/p L THR  with hx of Breast CA and cervical fusion   Clinical Impression   Pt was admitted for the above sx. All education was completed. No further OT is needed at this time    Follow Up Recommendations  No OT follow up;Supervision/Assistance - 24 hour    Equipment Recommendations  None recommended by OT    Recommendations for Other Services       Precautions / Restrictions Precautions Precautions: Fall Restrictions Weight Bearing Restrictions: No Other Position/Activity Restrictions: WBAT      Mobility Bed Mobility Overal bed mobility: Needs Assistance Bed Mobility: Supine to Sit     Supine to sit: Min assist     General bed mobility comments: oob  Transfers Overall transfer level: Needs assistance Equipment used: Rolling walker (2 wheeled) Transfers: Sit to/from Stand Sit to Stand: Min guard         General transfer comment: cues for UE/LE placement    Balance                                            ADL Overall ADL's : Needs assistance/impaired     Grooming: Wash/dry hands;Supervision/safety;Standing   Upper Body Bathing: Set up;Sitting   Lower Body Bathing: Minimal assistance;Sit to/from stand   Upper Body Dressing : Set up;Sitting   Lower Body Dressing: Moderate assistance;Sit to/from stand   Toilet Transfer: Min guard;Ambulation;BSC;RW   Toileting- Water quality scientist and Hygiene: Min guard;Sit to/from stand   Tub/ Shower Transfer: Walk-in shower;Min guard;3 in 1;Ambulation     General ADL Comments: performed ADL from 3:1 commode, and practiced bathroom transfers.  Husband will assist with adls as needed.       Vision     Perception     Praxis      Pertinent Vitals/Pain Pain Assessment: 0-10 Pain Score: 6  Pain Location: L hip/thigh Pain  Descriptors / Indicators: Aching;Burning Pain Intervention(s): Limited activity within patient's tolerance;Monitored during session;Repositioned;Premedicated before session     Hand Dominance     Extremity/Trunk Assessment Upper Extremity Assessment Upper Extremity Assessment: Overall WFL for tasks assessed          Communication Communication Communication: No difficulties   Cognition Arousal/Alertness: Awake/alert Behavior During Therapy: WFL for tasks assessed/performed Overall Cognitive Status: Within Functional Limits for tasks assessed                     General Comments       Exercises Exercises: Total Joint     Shoulder Instructions      Home Living Family/patient expects to be discharged to:: Private residence Living Arrangements: Spouse/significant other Available Help at Discharge: Family Type of Home: House Home Access: Stairs to enter Technical brewer of Steps: 3 Entrance Stairs-Rails: Right Home Layout: Able to live on main level with bedroom/bathroom     Bathroom Shower/Tub: Occupational psychologist: Handicapped height     Home Equipment: Shower seat   Additional Comments: pt has the highest toilet seat available      Prior Functioning/Environment Level of Independence: Independent             OT Diagnosis: Acute pain   OT Problem List:     OT Treatment/Interventions:  OT Goals(Current goals can be found in the care plan section) Acute Rehab OT Goals Patient Stated Goal: Regain IND and walk without pain OT Goal Formulation: All assessment and education complete, DC therapy  OT Frequency:     Barriers to D/C:            Co-evaluation              End of Session    Activity Tolerance: Patient tolerated treatment well Patient left: in chair;with call bell/phone within reach;with chair alarm set;with family/visitor present   Time: [id number] OT Time Calculation (min): 33 min Charges:  OT  General Charges $OT Visit: 1 Procedure OT Evaluation $OT Eval Low Complexity: 1 Procedure OT Treatments $Self Care/Home Management : 8-22 mins G-Codes:    Jeremi Losito 14-Jun-2016, 12:53 PM Lesle Chris, OTR/L [phone] 06-14-2016

## 2016-06-06 NOTE — Discharge Summary (Signed)
Physician Discharge Summary   Patient ID: Morgan Elliott MRN: [id number] DOB/AGE: January 26, 1946 70 y.o.  Admit date: 06/05/2016 Discharge date: 06/06/2016   Primary Diagnosis:  Osteoarthritis of the Left hip.   Admission Diagnoses:  Past Medical History:  Diagnosis Date  . Anxiety   . Arthritis    knees, hands, hip  . 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 Breast cancer ([place])   . Chronic kidney disease    recent dx of stage 3A Chronic kidney disease  . 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 Depression   . GERD (gastroesophageal reflux disease)   . H/O hiatal hernia   . Hematoma    right breast  . Hypertension    borderline  . IBS (irritable bowel syndrome)   . Osteopenia   . Pneumonia   . SVD (spontaneous vaginal delivery)    x 1  . Varicose veins of both lower extremities    Discharge Diagnoses:   Principal Problem:   OA (osteoarthritis) of hip  Estimated body mass index is 25.69 kg/m as calculated from the following:   Height as of this encounter: 5' 7" (1.702 m).   Weight as of this encounter: 74.4 kg (164 lb).  Procedure(s) (LRB): LEFT TOTAL HIP ARTHROPLASTY ANTERIOR APPROACH (Left)   Consults: None  HPI: Morgan Elliott is a 70 y.o. female who has advanced end-  stage arthritis of their Left  hip with progressively worsening pain and  dysfunction.The patient has failed nonoperative management and presents for  total hip arthroplasty.   Laboratory Data: Admission on 06/05/2016  Component Date Value Ref Range Status  . WBC 06/06/2016 10.4  4.0 - 10.5 K/uL Final  . RBC 06/06/2016 3.81* 3.87 - 5.11 MIL/uL Final  . Hemoglobin 06/06/2016 12.1  12.0 - 15.0 g/dL Final  . HCT 06/06/2016 35.8* 36.0 - 46.0 % Final  . MCV 06/06/2016 94.0  78.0 - 100.0 fL Final  . MCH 06/06/2016 31.8  26.0 - 34.0 pg Final  . MCHC 06/06/2016 33.8  30.0 - 36.0 g/dL Final  . RDW 06/06/2016 14.1  11.5 - 15.5 % Final  . Platelets 06/06/2016 218  150 - 400  K/uL Final  . Sodium 06/06/2016 136  135 - 145 mmol/L Final  . Potassium 06/06/2016 5.0  3.5 - 5.1 mmol/L Final  . Chloride 06/06/2016 104  101 - 111 mmol/L Final  . CO2 06/06/2016 27  22 - 32 mmol/L Final  . Glucose, Bld 06/06/2016 186* 65 - 99 mg/dL Final  . BUN 06/06/2016 10  6 - 20 mg/dL Final  . Creatinine, Ser 06/06/2016 0.63  0.44 - 1.00 mg/dL Final  . Calcium 06/06/2016 9.0  8.9 - 10.3 mg/dL Final  . GFR calc non Af Amer 06/06/2016 >60  >60 mL/min Final  . GFR calc Af Amer 06/06/2016 >60  >60 mL/min Final   Comment: (NOTE) The eGFR has been calculated using the CKD EPI equation. This calculation has not been validated in all clinical situations. eGFR's persistently <60 mL/min signify possible Chronic Kidney Disease.   Georgiann Hahn gap 06/06/2016 5  5 - 15 Final  Hospital Outpatient Visit on 05/30/2016  Component Date Value Ref Range Status  . aPTT 05/30/2016 33  24 - 36 seconds Final  . WBC 05/30/2016 5.9  4.0 - 10.5 K/uL Final  . RBC 05/30/2016 4.30  3.87 - 5.11 MIL/uL Final  . Hemoglobin 05/30/2016 13.8  12.0 - 15.0 g/dL Final  . HCT 05/30/2016  40.9  36.0 - 46.0 % Final  . MCV 05/30/2016 95.1  78.0 - 100.0 fL Final  . MCH 05/30/2016 32.1  26.0 - 34.0 pg Final  . MCHC 05/30/2016 33.7  30.0 - 36.0 g/dL Final  . RDW 05/30/2016 14.4  11.5 - 15.5 % Final  . Platelets 05/30/2016 256  150 - 400 K/uL Final  . Sodium 05/30/2016 134* 135 - 145 mmol/L Final  . Potassium 05/30/2016 4.1  3.5 - 5.1 mmol/L Final  . Chloride 05/30/2016 98* 101 - 111 mmol/L Final  . CO2 05/30/2016 28  22 - 32 mmol/L Final  . Glucose, Bld 05/30/2016 114* 65 - 99 mg/dL Final  . BUN 05/30/2016 12  6 - 20 mg/dL Final  . Creatinine, Ser 05/30/2016 0.85  0.44 - 1.00 mg/dL Final  . Calcium 05/30/2016 9.6  8.9 - 10.3 mg/dL Final  . Total Protein 05/30/2016 7.9  6.5 - 8.1 g/dL Final  . Albumin 05/30/2016 4.1  3.5 - 5.0 g/dL Final  . AST 05/30/2016 43* 15 - 41 U/L Final  . ALT 05/30/2016 25  14 - 54 U/L Final  .  Alkaline Phosphatase 05/30/2016 64  38 - 126 U/L Final  . Total Bilirubin 05/30/2016 0.6  0.3 - 1.2 mg/dL Final  . GFR calc non Af Amer 05/30/2016 >60  >60 mL/min Final  . GFR calc Af Amer 05/30/2016 >60  >60 mL/min Final   Comment: (NOTE) The eGFR has been calculated using the CKD EPI equation. This calculation has not been validated in all clinical situations. eGFR's persistently <60 mL/min signify possible Chronic Kidney Disease.   . Anion gap 05/30/2016 8  5 - 15 Final  . Prothrombin Time 05/30/2016 13.1  11.4 - 15.2 seconds Final  . INR 05/30/2016 0.99   Final  . ABO/RH(D) 06/05/2016 A POS   Final  . Antibody Screen 06/05/2016 NEG   Final  . Sample Expiration 06/05/2016 06/08/2016   Final  . Extend sample reason 06/05/2016 NO TRANSFUSIONS OR PREGNANCY IN THE PAST 3 MONTHS   Final  . Color, Urine 05/30/2016 YELLOW  YELLOW Final  . APPearance 05/30/2016 CLEAR  CLEAR Final  . Specific Gravity, Urine 05/30/2016 1.018  1.005 - 1.030 Final  . pH 05/30/2016 7.5  5.0 - 8.0 Final  . Glucose, UA 05/30/2016 NEGATIVE  NEGATIVE mg/dL Final  . Hgb urine dipstick 05/30/2016 NEGATIVE  NEGATIVE Final  . Bilirubin Urine 05/30/2016 NEGATIVE  NEGATIVE Final  . Ketones, ur 05/30/2016 NEGATIVE  NEGATIVE mg/dL Final  . Protein, ur 05/30/2016 NEGATIVE  NEGATIVE mg/dL Final  . Nitrite 05/30/2016 NEGATIVE  NEGATIVE Final  . Leukocytes, UA 05/30/2016 NEGATIVE  NEGATIVE Final  . MRSA, PCR 05/30/2016 NEGATIVE  NEGATIVE Final  . Staphylococcus aureus 05/30/2016 NEGATIVE  NEGATIVE Final   Comment:        The Xpert SA Assay (FDA approved for NASAL specimens in patients over 44 years of age), is one component of a comprehensive surveillance program.  Test performance has been validated by [facility] for patients greater than or equal to 2 year old. It is not intended to diagnose infection nor to guide or monitor treatment.   . ABO/RH(D) 05/30/2016 A POS   Final     X-Rays:Dg Pelvis  Portable  Result Date: 06/05/2016 CLINICAL DATA:  Left hip surgery. EXAM: DG C-ARM 1-60 MIN-NO REPORT; PORTABLE PELVIS 1-2 VIEWS<Procedure Description>DG C-ARM 1-60 MIN-NO REPORT; PORTABLE PELVIS 1-2 VIEWS COMPARISON:  None. FINDINGS: Total left hip replacement. Good anatomic alignment.  Hardware intact. IMPRESSION: The total left hip replacement.  Good anatomic alignment. Electronically Signed   By: Marcello Moores  Register   On: 06/05/2016 15:52   Dg C-arm 1-60 Min-no Report  Result Date: 06/05/2016 CLINICAL DATA:  Left hip surgery. EXAM: DG C-ARM 1-60 MIN-NO REPORT; PORTABLE PELVIS 1-2 VIEWS<Procedure Description>DG C-ARM 1-60 MIN-NO REPORT; PORTABLE PELVIS 1-2 VIEWS COMPARISON:  None. FINDINGS: Total left hip replacement. Good anatomic alignment. Hardware intact. IMPRESSION: The total left hip replacement.  Good anatomic alignment. Electronically Signed   By: Marcello Moores  Register   On: 06/05/2016 15:52    EKG: Orders placed or performed during the hospital encounter of 05/30/16  . EKG 12 lead  . EKG 12 lead     Hospital Course: Patient was admitted to [facility] and taken to the OR and underwent the above state procedure without complications.  Patient tolerated the procedure well and was later transferred to the recovery room and then to the orthopaedic floor for postoperative care.  They were given PO and IV analgesics for pain control following their surgery.  They were given 24 hours of postoperative antibiotics of  Anti-infectives    Start     Dose/Rate Route Frequency Ordered Stop   06/05/16 1930  ceFAZolin (ANCEF) IVPB 2g/100 mL premix     2 g 200 mL/hr over 30 Minutes Intravenous Every 6 hours 06/05/16 1728 06/06/16 0021   06/05/16 1315  ceFAZolin (ANCEF) IVPB 2g/100 mL premix     2 g 200 mL/hr over 30 Minutes Intravenous  Once 06/05/16 1301 06/05/16 1337     and started on DVT prophylaxis in the form of Xarelto.   PT and OT were ordered for total hip protocol.  The patient was  allowed to be WBAT with therapy. Discharge planning was consulted to help with postop disposition and equipment needs.  Patient had a good night on the evening of surgery.  They started to get up OOB with therapy on day one.  Hemovac drain was pulled without difficulty.  Dressing was checked on day one and was clean and dry. Patient did well with therapy and allowed home later that afternoon following therapy.  Discharge home with home health Diet - Cardiac diet and Renal diet Follow up - in 2 weeks Activity - WBAT Disposition - Home Condition Upon Discharge - Stable D/C Meds - See DC Summary DVT Prophylaxis - Xarelto  Discharge Instructions    Call MD / Call 911    Complete by:  As directed   If you experience chest pain or shortness of breath, CALL 911 and be transported to the hospital emergency room.  If you develope a fever above 101 F, pus ([place] drainage) or increased drainage or redness at the wound, or calf pain, call your surgeon's office.   Change dressing    Complete by:  As directed   You may change your dressing dressing daily with sterile 4 x 4 inch gauze dressing and paper tape.  Do not submerge the incision under water.   Constipation Prevention    Complete by:  As directed   Drink plenty of fluids.  Prune juice may be helpful.  You may use a stool softener, such as Colace (over the counter) 100 mg twice a day.  Use MiraLax (over the counter) for constipation as needed.   Diet - low sodium heart healthy    Complete by:  As directed   Discharge instructions    Complete by:  As directed  Pick up stool softner and laxative for home use following surgery while on pain medications. Do not submerge incision under water. Please use good hand washing techniques while changing dressing each day. May shower starting three days after surgery. Please use a clean towel to pat the incision dry following showers. Continue to use ice for pain and swelling after surgery. Do not use any  lotions or creams on the incision until instructed by your surgeon.   Postoperative Constipation Protocol  Constipation - defined medically as fewer than three stools per week and severe constipation as less than one stool per week.  One of the most common issues patients have following surgery is constipation.  Even if you have a regular bowel pattern at home, your normal regimen is likely to be disrupted due to multiple reasons following surgery.  Combination of anesthesia, postoperative narcotics, change in appetite and fluid intake all can affect your bowels.  In order to avoid complications following surgery, here are some recommendations in order to help you during your recovery period.  Colace (docusate) - Pick up an over-the-counter form of Colace or another stool softener and take twice a day as long as you are requiring postoperative pain medications.  Take with a full glass of water daily.  If you experience loose stools or diarrhea, hold the colace until you stool forms back up.  If your symptoms do not get better within 1 week or if they get worse, check with your doctor.  Dulcolax (bisacodyl) - Pick up over-the-counter and take as directed by the product packaging as needed to assist with the movement of your bowels.  Take with a full glass of water.  Use this product as needed if not relieved by Colace only.   MiraLax (polyethylene glycol) - Pick up over-the-counter to have on hand.  MiraLax is a solution that will increase the amount of water in your bowels to assist with bowel movements.  Take as directed and can mix with a glass of water, juice, soda, coffee, or tea.  Take if you go more than two days without a movement. Do not use MiraLax more than once per day. Call your doctor if you are still constipated or irregular after using this medication for 7 days in a row.  If you continue to have problems with postoperative constipation, please contact the office for further assistance  and recommendations.  If you experience "the worst abdominal pain ever" or develop nausea or vomiting, please contact the office immediatly for further recommendations for treatment.   Take Xarelto for two and a half more weeks, then discontinue Xarelto. Once the patient has completed the Xarelto, they may resume the 81 mg Aspirin.   Do not sit on low chairs, stoools or toilet seats, as it may be difficult to get up from low surfaces    Complete by:  As directed   Driving restrictions    Complete by:  As directed   No driving until released by the physician.   Increase activity slowly as tolerated    Complete by:  As directed   Lifting restrictions    Complete by:  As directed   No lifting until released by the physician.   Patient may shower    Complete by:  As directed   You may shower without a dressing once there is no drainage.  Do not wash over the wound.  If drainage remains, do not shower until drainage stops.   TED hose  Complete by:  As directed   Use stockings (TED hose) for 3 weeks on both leg(s).  You may remove them at night for sleeping.   Weight bearing as tolerated    Complete by:  As directed   Laterality:  left   Extremity:  Lower       Medication List    STOP taking these medications   aspirin EC 81 MG tablet   calcium carbonate 600 MG Tabs tablet Commonly known as:  OS-CAL   CENTRUM SILVER PO   cholecalciferol 400 units Tabs tablet Commonly known as:  VITAMIN D   Fish Oil 1000 MG Caps   HYDROcodone-acetaminophen 5-325 MG tablet Commonly known as:  NORCO/VICODIN   PROBIOTIC DAILY PO     TAKE these medications   ACANYA gel Generic drug:  Clindamycin Phos-Benzoyl Perox Apply 1 application topically daily as needed (acne). Reported on 04/09/2016   amLODipine 5 MG tablet Commonly known as:  NORVASC Take 5 mg by mouth daily.   cetirizine 10 MG tablet Commonly known as:  ZYRTEC Take 10 mg by mouth at bedtime.   citalopram 20 MG tablet Commonly  known as:  CELEXA Take 1 tablet (20 mg total) by mouth daily.   fluticasone 50 MCG/ACT nasal spray Commonly known as:  FLONASE Place 1 spray into both nostrils daily as needed for allergies. Reported on 04/09/2016   loperamide 2 MG capsule Commonly known as:  IMODIUM Take 2 mg by mouth as needed for diarrhea or loose stools.   losartan 50 MG tablet Commonly known as:  COZAAR Take 25 mg by mouth daily. Pt takes half of a 50 mg tablet.   methocarbamol 500 MG tablet Commonly known as:  ROBAXIN Take 1 tablet (500 mg total) by mouth every 6 (six) hours as needed for muscle spasms.   OVER THE COUNTER MEDICATION Take 1 tablet by mouth daily as needed (ib guard for IBS).   oxyCODONE 5 MG immediate release tablet Commonly known as:  Oxy IR/ROXICODONE Take 1-2 tablets (5-10 mg total) by mouth every 3 (three) hours as needed for moderate pain or severe pain.   ranitidine 300 MG tablet Commonly known as:  ZANTAC Take 150 mg by mouth at bedtime.   rivaroxaban 10 MG Tabs tablet Commonly known as:  XARELTO Take 1 tablet (10 mg total) by mouth daily with breakfast. Take Xarelto for two and a half more weeks, then discontinue Xarelto. Once the patient has completed the Xarelto, they may resume the 81 mg Aspirin.   zaleplon 5 MG capsule Commonly known as:  SONATA TAKE 1 CAPSULE AT BEDTIME AS NEEDED      Follow-up Information    Gearlean Alf, MD. Schedule an appointment as soon as possible for a visit on 06/18/2016.   Specialty:  Orthopedic Surgery Contact information: [address] [place] [address] [phone]           Signed: Arlee Muslim, PA-C Orthopaedic Surgery 06/06/2016, 9:28 AM

## 2016-06-06 NOTE — Care Management Note (Signed)
Case Management Note  Patient Details  Name: Morgan Elliott MRN: [id number] Date of Birth: [birth year]  Subjective/Objective:                  LEFT TOTAL HIP ARTHROPLASTY ANTERIOR APPROACH (Left) Action/Plan: Discharge planning Expected Discharge Date: 06/06/16                 Expected Discharge Plan:  Home/Self Care  In-House Referral:     Discharge planning Services  CM Consult  Post Acute Care Choice:    Choice offered to:  Patient  DME Arranged:    DME Agency:  NA  HH Arranged:  NA HH Agency:  NA  Status of Service:  Completed, signed off  If discussed at [place] of Stay Meetings, dates discussed:    Additional Comments: CM met with pt in room to confirm plan is for outpt PT; pt confirms.  Pt states she has all DME from previous surgeries at home.  No other Cm needs were communicated. Dellie Catholic, RN 06/06/2016, 12:01 PM

## 2016-06-06 NOTE — Progress Notes (Signed)
Physical Therapy Treatment Patient Details Name: Morgan Elliott MRN: [id number] DOB: Oct 07, 1946 Today's Date: 06/06/2016    History of Present Illness Pt s/p L THR  with hx of Breast CA and cervical fusion    PT Comments    Pt progressing well with mobility and eager for return home.  Reviewed therex, stairs and car transfers with pt and spouse.  Written instruction provided.  Follow Up Recommendations  Outpatient PT     Equipment Recommendations  None recommended by PT    Recommendations for Other Services OT consult     Precautions / Restrictions Precautions Precautions: Fall Restrictions Weight Bearing Restrictions: No Other Position/Activity Restrictions: WBAT    Mobility  Bed Mobility Overal bed mobility: Needs Assistance Bed Mobility: Supine to Sit;Sit to Supine     Supine to sit: Min assist Sit to supine: Min assist   General bed mobility comments: cues for sequence and use of R LE to self assist  Transfers Overall transfer level: Needs assistance Equipment used: Rolling walker (2 wheeled) Transfers: Sit to/from Stand Sit to Stand: Min guard;Supervision         General transfer comment: cues for UE/LE placement  Ambulation/Gait Ambulation/Gait assistance: Min guard;Supervision Ambulation Distance (Feet): 150 Feet Assistive device: Rolling walker (2 wheeled) Gait Pattern/deviations: Step-to pattern;Shuffle;Trunk flexed Gait velocity: decr Gait velocity interpretation: Below normal speed for age/gender General Gait Details: cues for sequence, posture and position from RW   Stairs Stairs: Yes Stairs assistance: Min assist Stair Management: One rail Right;Step to pattern;Forwards;With crutches Number of Stairs: 5 General stair comments: cues for sequence and foot/crutch placement  Wheelchair Mobility    Modified Rankin (Stroke Patients Only)       Balance                                    Cognition Arousal/Alertness:  Awake/alert Behavior During Therapy: WFL for tasks assessed/performed Overall Cognitive Status: Within Functional Limits for tasks assessed                      Exercises Total Joint Exercises Ankle Circles/Pumps: AROM;Both;15 reps;Supine Quad Sets: AROM;Both;10 reps;Supine Heel Slides: AAROM;Left;20 reps;Supine Hip ABduction/ADduction: AAROM;Left;15 reps;Supine Long Arc Quad: AROM;Left;10 reps;Supine    General Comments        Pertinent Vitals/Pain Pain Assessment: 0-10 Pain Score: 4  Pain Location: L hip Pain Descriptors / Indicators: Aching;Sore Pain Intervention(s): Limited activity within patient's tolerance;Monitored during session;Premedicated before session;Ice applied    Home Living Family/patient expects to be discharged to:: Private residence Living Arrangements: Spouse/significant other Available Help at Discharge: Family         Home Equipment: Shower seat Additional Comments: pt has the highest toilet seat available    Prior Function Level of Independence: Independent          PT Goals (current goals can now be found in the care plan section) Acute Rehab PT Goals Patient Stated Goal: Regain IND and walk without pain PT Goal Formulation: With patient Time For Goal Achievement: 06/08/16 Potential to Achieve Goals: Good Progress towards PT goals: Progressing toward goals    Frequency  7X/week    PT Plan Current plan remains appropriate    Co-evaluation             End of Session Equipment Utilized During Treatment: Gait belt Activity Tolerance: Patient tolerated treatment well Patient left: in chair;with call bell/phone within reach;with  chair alarm set     Time: [phone] PT Time Calculation (min) (ACUTE ONLY): 47 min  Charges:  $Gait Training: 8-22 mins $Therapeutic Exercise: 8-22 mins $Therapeutic Activity: 8-22 mins                    G Codes:      Vaniah Chambers 07/06/2016, 3:58 PM

## 2016-06-06 NOTE — Discharge Instructions (Addendum)
° °Dr. Frank Aluisio °Total Joint Specialist °[facility] °[address] °[place], [place] [address] °[phone] ° °ANTERIOR APPROACH TOTAL HIP REPLACEMENT POSTOPERATIVE DIRECTIONS ° ° °Hip Rehabilitation, Guidelines Following Surgery  °The results of a hip operation are greatly improved after range of motion and muscle strengthening exercises. Follow all safety measures which are given to protect your hip. If any of these exercises cause increased pain or swelling in your joint, decrease the amount until you are comfortable again. Then slowly increase the exercises. Call your caregiver if you have problems or questions.  ° °HOME CARE INSTRUCTIONS  °Remove items at home which could result in a fall. This includes throw rugs or furniture in walking pathways.  °· ICE to the affected hip every three hours for 30 minutes at a time and then as needed for pain and swelling.  Continue to use ice on the hip for pain and swelling from surgery. You may notice swelling that will progress down to the foot and ankle.  This is normal after surgery.  Elevate the leg when you are not up walking on it.   °· Continue to use the breathing machine which will help keep your temperature down.  It is common for your temperature to cycle up and down following surgery, especially at night when you are not up moving around and exerting yourself.  The breathing machine keeps your lungs expanded and your temperature down. ° ° °DIET °You may resume your previous home diet once your are discharged from the hospital. ° °DRESSING / WOUND CARE / SHOWERING °You may shower 3 days after surgery, but keep the wounds dry during showering.  You may use an occlusive plastic wrap (Press'n Seal for example), NO SOAKING/SUBMERGING IN THE BATHTUB.  If the bandage gets wet, change with a clean dry gauze.  If the incision gets wet, pat the wound dry with a clean towel. °You may start showering once you are discharged home but do not  submerge the incision under water. Just pat the incision dry and apply a dry gauze dressing on daily. °Change the surgical dressing daily and reapply a dry dressing each time. ° °ACTIVITY °Walk with your walker as instructed. °Use walker as long as suggested by your caregivers. °Avoid periods of inactivity such as sitting longer than an hour when not asleep. This helps prevent blood clots.  °You may resume a sexual relationship in one month or when given the OK by your doctor.  °You may return to work once you are cleared by your doctor.  °Do not drive a car for 6 weeks or until released by you surgeon.  °Do not drive while taking narcotics. ° °WEIGHT BEARING °Weight bearing as tolerated with assist device (walker, cane, etc) as directed, use it as long as suggested by your surgeon or therapist, typically at least 4-6 weeks. ° °POSTOPERATIVE CONSTIPATION PROTOCOL °Constipation - defined medically as fewer than three stools per week and severe constipation as less than one stool per week. ° °One of the most common issues patients have following surgery is constipation.  Even if you have a regular bowel pattern at home, your normal regimen is likely to be disrupted due to multiple reasons following surgery.  Combination of anesthesia, postoperative narcotics, change in appetite and fluid intake all can affect your bowels.  In order to avoid complications following surgery, here are some recommendations in order to help you during your recovery period. ° °Colace (docusate) - Pick up an over-the-counter   form of Colace or another stool softener and take twice a day as long as you are requiring postoperative pain medications.  Take with a full glass of water daily.  If you experience loose stools or diarrhea, hold the colace until you stool forms back up.  If your symptoms do not get better within 1 week or if they get worse, check with your doctor. ° °Dulcolax (bisacodyl) - Pick up over-the-counter and take as directed  by the product packaging as needed to assist with the movement of your bowels.  Take with a full glass of water.  Use this product as needed if not relieved by Colace only.  ° °MiraLax (polyethylene glycol) - Pick up over-the-counter to have on hand.  MiraLax is a solution that will increase the amount of water in your bowels to assist with bowel movements.  Take as directed and can mix with a glass of water, juice, soda, coffee, or tea.  Take if you go more than two days without a movement. °Do not use MiraLax more than once per day. Call your doctor if you are still constipated or irregular after using this medication for 7 days in a row. ° °If you continue to have problems with postoperative constipation, please contact the office for further assistance and recommendations.  If you experience "the worst abdominal pain ever" or develop nausea or vomiting, please contact the office immediatly for further recommendations for treatment. ° °ITCHING ° If you experience itching with your medications, try taking only a single pain pill, or even half a pain pill at a time.  You can also use Benadryl over the counter for itching or also to help with sleep.  ° °TED HOSE STOCKINGS °Wear the elastic stockings on both legs for three weeks following surgery during the day but you may remove then at night for sleeping. ° °MEDICATIONS °See your medication summary on the “After Visit Summary” that the nursing staff will review with you prior to discharge.  You may have some home medications which will be placed on hold until you complete the course of blood thinner medication.  It is important for you to complete the blood thinner medication as prescribed by your surgeon.  Continue your approved medications as instructed at time of discharge. ° °PRECAUTIONS °If you experience chest pain or shortness of breath - call 911 immediately for transfer to the hospital emergency department.  °If you develop a fever greater that 101 F,  purulent drainage from wound, increased redness or drainage from wound, foul odor from the wound/dressing, or calf pain - CONTACT YOUR SURGEON.   °                                                °FOLLOW-UP APPOINTMENTS °Make sure you keep all of your appointments after your operation with your surgeon and caregivers. You should call the office at the above phone number and make an appointment for approximately two weeks after the date of your surgery or on the date instructed by your surgeon outlined in the "After Visit Summary". ° °RANGE OF MOTION AND STRENGTHENING EXERCISES  °These exercises are designed to help you keep full movement of your hip joint. Follow your caregiver's or physical therapist's instructions. Perform all exercises about fifteen times, three times per day or as directed. Exercise both hips, even if you   have had only one joint replacement. These exercises can be done on a training (exercise) mat, on the floor, on a table or on a bed. Use whatever works the best and is most comfortable for you. Use music or television while you are exercising so that the exercises are a pleasant break in your day. This will make your life better with the exercises acting as a break in routine you can look forward to.  °Lying on your back, slowly slide your foot toward your buttocks, raising your knee up off the floor. Then slowly slide your foot back down until your leg is straight again.  °Lying on your back spread your legs as far apart as you can without causing discomfort.  °Lying on your side, raise your upper leg and foot straight up from the floor as far as is comfortable. Slowly lower the leg and repeat.  °Lying on your back, tighten up the muscle in the front of your thigh (quadriceps muscles). You can do this by keeping your leg straight and trying to raise your heel off the floor. This helps strengthen the largest muscle supporting your knee.  °Lying on your back, tighten up the muscles of your  buttocks both with the legs straight and with the knee bent at a comfortable angle while keeping your heel on the floor.  ° °IF YOU ARE TRANSFERRED TO A SKILLED REHAB FACILITY °If the patient is transferred to a skilled rehab facility following release from the hospital, a list of the current medications will be sent to the facility for the patient to continue.  When discharged from the skilled rehab facility, please have the facility set up the patient's Home Health Physical Therapy prior to being released. Also, the skilled facility will be responsible for providing the patient with their medications at time of release from the facility to include their pain medication, the muscle relaxants, and their blood thinner medication. If the patient is still at the rehab facility at time of the two week follow up appointment, the skilled rehab facility will also need to assist the patient in arranging follow up appointment in our office and any transportation needs. ° °MAKE SURE YOU:  °Understand these instructions.  °Get help right away if you are not doing well or get worse.  ° ° °Pick up stool softner and laxative for home use following surgery while on pain medications. °Do not submerge incision under water. °Please use good hand washing techniques while changing dressing each day. °May shower starting three days after surgery. °Please use a clean towel to pat the incision dry following showers. °Continue to use ice for pain and swelling after surgery. °Do not use any lotions or creams on the incision until instructed by your surgeon. ° °Take Xarelto for two and a half more weeks, then discontinue Xarelto. °Once the patient has completed the Xarelto, they may resume the 81 mg Aspirin. ° ° ° °Information on my medicine - XARELTO® (Rivaroxaban) ° °This medication education was reviewed with me or my healthcare representative as part of my discharge preparation.  The pharmacist that spoke with me during my hospital stay  was:  Russell Quinney D Marshal Schrecengost ° °Why was Xarelto® prescribed for you? °Xarelto® was prescribed for you to reduce the risk of blood clots forming after orthopedic surgery. The medical term for these abnormal blood clots is venous thromboembolism (VTE). ° °What do you need to know about xarelto® ? °Take your Xarelto® ONCE DAILY at the same time every   day. °You may take it either with or without food. ° °If you have difficulty swallowing the tablet whole, you may crush it and mix in applesauce just prior to taking your dose. ° °Take Xarelto® exactly as prescribed by your doctor and DO NOT stop taking Xarelto® without talking to the doctor who prescribed the medication.  Stopping without other VTE prevention medication to take the place of Xarelto® may increase your risk of developing a clot. ° °After discharge, you should have regular check-up appointments with your healthcare provider that is prescribing your Xarelto®.   ° °What do you do if you miss a dose? °If you miss a dose, take it as soon as you remember on the same day then continue your regularly scheduled once daily regimen the next day. Do not take two doses of Xarelto® on the same day.  ° °Important Safety Information °A possible side effect of Xarelto® is bleeding. You should call your healthcare provider right away if you experience any of the following: °? Bleeding from an injury or your nose that does not stop. °? Unusual colored urine (red or dark brown) or unusual colored stools (red or black). °? Unusual bruising for unknown reasons. °? A serious fall or if you hit your head (even if there is no bleeding). ° °Some medicines may interact with Xarelto® and might increase your risk of bleeding while on Xarelto®. To help avoid this, consult your healthcare provider or pharmacist prior to using any new prescription or non-prescription medications, including herbals, vitamins, non-steroidal anti-inflammatory drugs (NSAIDs) and supplements. ° °This website has more  information on Xarelto®: www.xarelto.com. ° ° °

## 2016-06-10 DIAGNOSIS — Z471 Aftercare following joint replacement surgery: Secondary | ICD-10-CM | POA: Diagnosis not present

## 2016-06-10 DIAGNOSIS — M1612 Unilateral primary osteoarthritis, left hip: Secondary | ICD-10-CM | POA: Diagnosis not present

## 2016-06-10 DIAGNOSIS — Z96642 Presence of left artificial hip joint: Secondary | ICD-10-CM | POA: Diagnosis not present

## 2016-06-13 DIAGNOSIS — M1612 Unilateral primary osteoarthritis, left hip: Secondary | ICD-10-CM | POA: Diagnosis not present

## 2016-06-17 DIAGNOSIS — M1612 Unilateral primary osteoarthritis, left hip: Secondary | ICD-10-CM | POA: Diagnosis not present

## 2016-06-18 DIAGNOSIS — D1801 Hemangioma of skin and subcutaneous tissue: Secondary | ICD-10-CM | POA: Diagnosis not present

## 2016-06-18 DIAGNOSIS — D2261 Melanocytic nevi of right upper limb, including shoulder: Secondary | ICD-10-CM | POA: Diagnosis not present

## 2016-06-18 DIAGNOSIS — Z85828 Personal history of other malignant neoplasm of skin: Secondary | ICD-10-CM | POA: Diagnosis not present

## 2016-06-18 DIAGNOSIS — L821 Other seborrheic keratosis: Secondary | ICD-10-CM | POA: Diagnosis not present

## 2016-06-20 DIAGNOSIS — Z471 Aftercare following joint replacement surgery: Secondary | ICD-10-CM | POA: Diagnosis not present

## 2016-06-20 DIAGNOSIS — M1612 Unilateral primary osteoarthritis, left hip: Secondary | ICD-10-CM | POA: Diagnosis not present

## 2016-06-20 DIAGNOSIS — Z96642 Presence of left artificial hip joint: Secondary | ICD-10-CM | POA: Diagnosis not present

## 2016-06-25 DIAGNOSIS — M1612 Unilateral primary osteoarthritis, left hip: Secondary | ICD-10-CM | POA: Diagnosis not present

## 2016-06-26 ENCOUNTER — Ambulatory Visit
Admission: RE | Admit: 2016-06-26 | Discharge: 2016-06-26 | Disposition: A | Discharge status: Home / Self Care | Payer: Medicare Other | Source: Ambulatory Visit | Attending: Family Medicine | Admitting: Family Medicine

## 2016-06-26 DIAGNOSIS — R922 Inconclusive mammogram: Secondary | ICD-10-CM | POA: Diagnosis not present

## 2016-06-26 DIAGNOSIS — Z853 Personal history of malignant neoplasm of breast: Secondary | ICD-10-CM

## 2016-06-27 DIAGNOSIS — M1612 Unilateral primary osteoarthritis, left hip: Secondary | ICD-10-CM | POA: Diagnosis not present

## 2016-07-02 DIAGNOSIS — M1612 Unilateral primary osteoarthritis, left hip: Secondary | ICD-10-CM | POA: Diagnosis not present

## 2016-07-04 DIAGNOSIS — M1612 Unilateral primary osteoarthritis, left hip: Secondary | ICD-10-CM | POA: Diagnosis not present

## 2016-07-08 DIAGNOSIS — M1612 Unilateral primary osteoarthritis, left hip: Secondary | ICD-10-CM | POA: Diagnosis not present

## 2016-07-10 DIAGNOSIS — M1612 Unilateral primary osteoarthritis, left hip: Secondary | ICD-10-CM | POA: Diagnosis not present

## 2016-07-11 DIAGNOSIS — Z471 Aftercare following joint replacement surgery: Secondary | ICD-10-CM | POA: Diagnosis not present

## 2016-07-11 DIAGNOSIS — Z96642 Presence of left artificial hip joint: Secondary | ICD-10-CM | POA: Diagnosis not present

## 2016-07-17 DIAGNOSIS — Z23 Encounter for immunization: Secondary | ICD-10-CM | POA: Diagnosis not present

## 2016-07-30 DIAGNOSIS — M170 Bilateral primary osteoarthritis of knee: Secondary | ICD-10-CM | POA: Diagnosis not present

## 2016-07-30 DIAGNOSIS — Z471 Aftercare following joint replacement surgery: Secondary | ICD-10-CM | POA: Diagnosis not present

## 2016-07-30 DIAGNOSIS — Z96642 Presence of left artificial hip joint: Secondary | ICD-10-CM | POA: Diagnosis not present

## 2016-08-28 DIAGNOSIS — M170 Bilateral primary osteoarthritis of knee: Secondary | ICD-10-CM | POA: Diagnosis not present

## 2016-09-04 DIAGNOSIS — M170 Bilateral primary osteoarthritis of knee: Secondary | ICD-10-CM | POA: Diagnosis not present

## 2016-09-11 DIAGNOSIS — M170 Bilateral primary osteoarthritis of knee: Secondary | ICD-10-CM | POA: Diagnosis not present

## 2016-10-24 DIAGNOSIS — M1712 Unilateral primary osteoarthritis, left knee: Secondary | ICD-10-CM | POA: Diagnosis not present

## 2016-10-24 DIAGNOSIS — Z471 Aftercare following joint replacement surgery: Secondary | ICD-10-CM | POA: Diagnosis not present

## 2016-10-24 DIAGNOSIS — Z96642 Presence of left artificial hip joint: Secondary | ICD-10-CM | POA: Diagnosis not present

## 2016-10-24 DIAGNOSIS — M1711 Unilateral primary osteoarthritis, right knee: Secondary | ICD-10-CM | POA: Diagnosis not present

## 2016-10-24 DIAGNOSIS — M170 Bilateral primary osteoarthritis of knee: Secondary | ICD-10-CM | POA: Diagnosis not present

## 2016-12-19 DIAGNOSIS — N183 Chronic kidney disease, stage 3 (moderate): Secondary | ICD-10-CM | POA: Diagnosis not present

## 2016-12-19 DIAGNOSIS — I10 Essential (primary) hypertension: Secondary | ICD-10-CM | POA: Diagnosis not present

## 2016-12-19 DIAGNOSIS — M85852 Other specified disorders of bone density and structure, left thigh: Secondary | ICD-10-CM | POA: Diagnosis not present

## 2016-12-26 DIAGNOSIS — M899 Disorder of bone, unspecified: Secondary | ICD-10-CM | POA: Diagnosis not present

## 2016-12-26 DIAGNOSIS — G479 Sleep disorder, unspecified: Secondary | ICD-10-CM | POA: Diagnosis not present

## 2016-12-26 DIAGNOSIS — F339 Major depressive disorder, recurrent, unspecified: Secondary | ICD-10-CM | POA: Diagnosis not present

## 2016-12-26 DIAGNOSIS — N183 Chronic kidney disease, stage 3 (moderate): Secondary | ICD-10-CM | POA: Diagnosis not present

## 2016-12-26 DIAGNOSIS — Z0000 Encounter for general adult medical examination without abnormal findings: Secondary | ICD-10-CM | POA: Diagnosis not present

## 2016-12-26 DIAGNOSIS — J309 Allergic rhinitis, unspecified: Secondary | ICD-10-CM | POA: Diagnosis not present

## 2016-12-26 DIAGNOSIS — K30 Functional dyspepsia: Secondary | ICD-10-CM | POA: Diagnosis not present

## 2016-12-26 DIAGNOSIS — I10 Essential (primary) hypertension: Secondary | ICD-10-CM | POA: Diagnosis not present

## 2016-12-26 DIAGNOSIS — Z853 Personal history of malignant neoplasm of breast: Secondary | ICD-10-CM | POA: Diagnosis not present

## 2017-01-13 DIAGNOSIS — Z1211 Encounter for screening for malignant neoplasm of colon: Secondary | ICD-10-CM | POA: Diagnosis not present

## 2017-04-08 ENCOUNTER — Encounter: Payer: Self-pay | Admitting: Adult Health

## 2017-04-08 ENCOUNTER — Ambulatory Visit (HOSPITAL_BASED_OUTPATIENT_CLINIC_OR_DEPARTMENT_OTHER): Payer: Medicare Other | Admitting: Adult Health

## 2017-04-08 VITALS — BP 136/69 | HR 76 | Temp 98.1°F | Resp 18 | Ht 67.0 in | Wt 167.9 lb

## 2017-04-08 DIAGNOSIS — M8589 Other specified disorders of bone density and structure, multiple sites: Secondary | ICD-10-CM

## 2017-04-08 DIAGNOSIS — Z1239 Encounter for other screening for malignant neoplasm of breast: Secondary | ICD-10-CM

## 2017-04-08 DIAGNOSIS — Z853 Personal history of malignant neoplasm of breast: Secondary | ICD-10-CM

## 2017-04-08 NOTE — Progress Notes (Signed)
CLINIC:  Survivorship   REASON FOR VISIT:  Routine follow-up for history of breast cancer.   BRIEF ONCOLOGIC HISTORY:    Breast cancer, left breast ([place])   01/11/2010 Initial Biopsy    Right breas lumpectomy: DCIS, fibrocystic changes with calcifications; [facility]      06/15/2010 Procedure    Left breast biopsy: Atypical ductal hyperplasia and fibrocystic changes       - 2011 Radiation Therapy    Adjuvant radiation therapy      06/20/2010 - 03/27/2015 Anti-estrogen oral therapy    Tamoxifen 20 mg daily      06/27/2010 Surgery    Left breast lumpectomy: No malignancy found fibrocystic changes, benign        INTERVAL HISTORY:  Morgan Elliott presents to the [facility] today for routine follow-up for her history of breast cancer.  Overall, she reports feeling quite well. She is not having any issues today.  She sees her PCP regularly.  She is undergoing colonoscopy when due.  She has her skin checked regularly by dermatology.  She is exercising some.      REVIEW OF SYSTEMS:  Review of Systems  Constitutional: Negative for appetite change, chills, diaphoresis, fatigue, fever and unexpected weight change.  HENT:   Negative for hearing loss and lump/mass.   Eyes: Negative for eye problems and icterus.  Respiratory: Negative for chest tightness, cough and shortness of breath.   Cardiovascular: Negative for chest pain, leg swelling and palpitations.  Gastrointestinal: Negative for abdominal pain, constipation, diarrhea, nausea and vomiting.  Endocrine: Negative for hot flashes.  Genitourinary: Negative for difficulty urinating.   Musculoskeletal: Negative for arthralgias.  Skin: Negative for itching and rash.  Neurological: Negative for dizziness, extremity weakness, headaches and numbness.  Hematological: Negative for adenopathy. Does not bruise/bleed easily.  Psychiatric/Behavioral: Negative for depression. The patient is not nervous/anxious.   Breast: Denies any new  nodularity, masses, tenderness, nipple changes, or nipple discharge.       PAST MEDICAL/SURGICAL HISTORY:  Past Medical History:  Diagnosis Date  . Anxiety   . Arthritis    knees, hands, hip  . 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 Breast cancer ([place])   . Chronic kidney disease    recent dx of stage 3A Chronic kidney disease  . 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 Depression   . GERD (gastroesophageal reflux disease)   . H/O hiatal hernia   . Hematoma    right breast  . Hypertension    borderline  . IBS (irritable bowel syndrome)   . Osteopenia   . Pneumonia   . SVD (spontaneous vaginal delivery)    x 1  . Varicose veins of both lower extremities    Past Surgical History:  Procedure Laterality Date  . BREAST BIOPSY  5/08, 12/2009    x 2 left  . BREAST BIOPSY Right 05/2014   Benign   . BREAST LUMPECTOMY  06/2010   x 2 right   . CERVICAL FUSION  5/04   C3-C6  . COLONOSCOPY    . DIAGNOSTIC LAPAROSCOPY     tubal ligation  . DILATATION & CURRETTAGE/HYSTEROSCOPY WITH RESECTOCOPE N/A 01/11/2014   Procedure: [place];  Surgeon: Lyman Speller, MD;  Location: [facility];  Service: Gynecology;  Laterality: N/A;  resection of endometrial polyp  . DILATION AND CURETTAGE OF UTERUS     x3 for heavy bleeding  . EYE SURGERY     lasik  left eye  . KNEE ARTHROSCOPY  2012   left  . TOTAL HIP ARTHROPLASTY Left 06/05/2016   Procedure: LEFT TOTAL HIP ARTHROPLASTY ANTERIOR APPROACH;  Surgeon: Gaynelle Arabian, MD;  Location: [facility];  Service: Orthopedics;  Laterality: Left;  . TUBAL LIGATION     and lap  . UPPER GI ENDOSCOPY  ~2013     ALLERGIES:  No Known Allergies   CURRENT MEDICATIONS:  Outpatient Encounter Prescriptions as of 04/08/2017  Medication Sig  . amLODipine (NORVASC) 5 MG tablet Take 5 mg by mouth daily.  . cetirizine (ZYRTEC) 10 MG tablet Take 10 mg by mouth at bedtime.   . citalopram  (CELEXA) 20 MG tablet Take 1 tablet (20 mg total) by mouth daily.  . fluticasone (FLONASE) 50 MCG/ACT nasal spray Place 1 spray into both nostrils daily as needed for allergies. Reported on 04/09/2016  . losartan (COZAAR) 50 MG tablet Take 25 mg by mouth daily. Pt takes half of a 50 mg tablet.  Marland Kitchen OVER THE COUNTER MEDICATION Take 1 tablet by mouth daily as needed (ib guard for IBS).  Marland Kitchen ranitidine (ZANTAC) 300 MG tablet Take 150 mg by mouth at bedtime.   . zaleplon (SONATA) 5 MG capsule TAKE 1 CAPSULE AT BEDTIME AS NEEDED  . [DISCONTINUED] Clindamycin Phos-Benzoyl Perox (ACANYA) gel Apply 1 application topically daily as needed (acne). Reported on 04/09/2016  . [DISCONTINUED] loperamide (IMODIUM) 2 MG capsule Take 2 mg by mouth as needed for diarrhea or loose stools.  . [DISCONTINUED] methocarbamol (ROBAXIN) 500 MG tablet Take 1 tablet (500 mg total) by mouth every 6 (six) hours as needed for muscle spasms. (Patient not taking: Reported on 04/08/2017)  . [DISCONTINUED] oxyCODONE (OXY IR/ROXICODONE) 5 MG immediate release tablet Take 1-2 tablets (5-10 mg total) by mouth every 3 (three) hours as needed for moderate pain or severe pain. (Patient not taking: Reported on 04/08/2017)  . [DISCONTINUED] rivaroxaban (XARELTO) 10 MG TABS tablet Take 1 tablet (10 mg total) by mouth daily with breakfast. Take Xarelto for two and a half more weeks, then discontinue Xarelto. Once the patient has completed the Xarelto, they may resume the 81 mg Aspirin. (Patient not taking: Reported on 04/08/2017)   No facility-administered encounter medications on file as of 04/08/2017.      ONCOLOGIC FAMILY HISTORY:  Family History  Problem Relation Age of Onset  . Hypertension Mother   . Heart attack Mother   . Hypertension Father   . Cancer Maternal Aunt 84       leukemia, Breast cancer  . Breast cancer Sister 60  . Cancer Paternal Grandfather 34       GI related    GENETIC COUNSELING/TESTING: Did not have  SOCIAL  HISTORY:  Morgan Elliott is married and lives with her husband in [place], [place].  She has 1 child and she lives in [place].  Morgan Elliott is currently retired.  She denies any current or history of tobacco, alcohol, or illicit drug use.     PHYSICAL EXAMINATION:  Vital Signs: Vitals:   04/08/17 1114  BP: 136/69  Pulse: 76  Resp: 18  Temp: 98.1 F (36.7 C)   Filed Weights   04/08/17 1114  Weight: 167 lb 14.4 oz (76.2 kg)   General: Well-nourished, well-appearing female in no acute distress.  Unaccompanied today.   HEENT: Head is normocephalic.  Pupils equal and reactive to light. Conjunctivae clear without exudate.  Sclerae anicteric. Oral mucosa is pink, moist.  Oropharynx is pink without  lesions or erythema.  Lymph: No cervical, supraclavicular, or infraclavicular lymphadenopathy noted on palpation.  Cardiovascular: Regular rate and rhythm.Marland Kitchen Respiratory: Clear to auscultation bilaterally. Chest expansion symmetric; breathing non-labored.  Breast Exam:  -Left breast: No appreciable masses on palpation. No skin redness, thickening, or peau d'orange appearance; no nipple retraction or nipple discharge; mild distortion in symmetry at previous lumpectomy site well healed scar without erythema or nodularity.  -Right breast: No appreciable masses on palpation. No skin redness, thickening, or peau d'orange appearance; no nipple retraction or nipple discharge; mild distortion in symmetry at previous lumpectomy site well healed scar without erythema or nodularity. -Axilla: No axillary adenopathy bilaterally.  GI: Abdomen soft and round; non-tender, non-distended. Bowel sounds normoactive. No hepatosplenomegaly.   GU: Deferred.  Neuro: No focal deficits. Steady gait.  Psych: Mood and affect normal and appropriate for situation.  MSK: No focal spinal tenderness to palpation, full range of motion in bilateral upper extremities Extremities: No edema. Skin: Warm and  dry.  LABORATORY DATA:  None for this visit   DIAGNOSTIC IMAGING:  Most recent mammogram:     ASSESSMENT AND PLAN:  Ms.. Elliott is a pleasant 71 y.o. 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  [facility] for surveillance and routine follow-up.   1. History of breast cancer:  Morgan Elliott is currently clinically and radiographically without evidence of disease or recurrence of breast cancer. She will be due for mammogram in 06/2017; orders placed today.  I encouraged her to call me with any questions or concerns before her next visit at the cancer center, and I would be happy to see her sooner, if needed.    2. Bone health:  Given Morgan Elliott's age, history of breast cancer, she is at risk for bone demineralization. Her last DEXA scan was on 06/09/2015 and was consistent with osteopenia.  She will have another one in September, at the breast center.  In the meantime, she was encouraged to increase her consumption of foods rich in calcium, as well as increase her weight-bearing activities.  She was given education on specific food and activities to promote bone health.  3. Cancer screening:  Due to Morgan Elliott's history and her age, she should receive screening for skin cancers, colon cancers. She was encouraged to follow-up with her PCP for appropriate cancer screenings.   4. Health maintenance and wellness promotion: Morgan Elliott was encouraged to consume 5-7 servings of fruits and vegetables per day. She was also encouraged to engage in moderate to vigorous exercise for 30 minutes per day most days of the week. She was instructed to limit her alcohol consumption and continue to abstain from tobacco use.    Dispo:  -Return to cancer center in one year for LTS -Mammogram and bone density in 06/2017 at the breast center   A total  of (30) minutes of face-to-face time was spent with this patient with greater than 50% of that time in counseling and care-coordination.   Gardenia Phlegm, NP Survivorship Program [facility] [phone]   Note: PRIMARY CARE PROVIDER Cari Caraway, [place] [phone]

## 2017-04-09 ENCOUNTER — Encounter: Payer: Medicare Other | Admitting: Nurse Practitioner

## 2017-05-16 DIAGNOSIS — M170 Bilateral primary osteoarthritis of knee: Secondary | ICD-10-CM | POA: Diagnosis not present

## 2017-05-21 ENCOUNTER — Other Ambulatory Visit: Payer: Self-pay | Admitting: Adult Health

## 2017-05-21 DIAGNOSIS — Z1231 Encounter for screening mammogram for malignant neoplasm of breast: Secondary | ICD-10-CM

## 2017-05-23 DIAGNOSIS — M170 Bilateral primary osteoarthritis of knee: Secondary | ICD-10-CM | POA: Diagnosis not present

## 2017-05-30 DIAGNOSIS — M170 Bilateral primary osteoarthritis of knee: Secondary | ICD-10-CM | POA: Diagnosis not present

## 2017-06-27 ENCOUNTER — Ambulatory Visit
Admission: RE | Admit: 2017-06-27 | Discharge: 2017-06-27 | Disposition: A | Discharge status: Home / Self Care | Payer: Medicare Other | Source: Ambulatory Visit | Attending: Adult Health | Admitting: Adult Health

## 2017-06-27 DIAGNOSIS — Z1231 Encounter for screening mammogram for malignant neoplasm of breast: Secondary | ICD-10-CM

## 2017-06-27 DIAGNOSIS — Z780 Asymptomatic menopausal state: Secondary | ICD-10-CM | POA: Diagnosis not present

## 2017-06-27 DIAGNOSIS — M85851 Other specified disorders of bone density and structure, right thigh: Secondary | ICD-10-CM | POA: Diagnosis not present

## 2017-06-27 DIAGNOSIS — M8589 Other specified disorders of bone density and structure, multiple sites: Secondary | ICD-10-CM

## 2017-07-18 ENCOUNTER — Telehealth: Payer: Self-pay

## 2017-07-18 NOTE — Telephone Encounter (Signed)
Left VM for patient to call back concerning BD results and ask for NP's nurse.

## 2017-07-21 ENCOUNTER — Telehealth: Payer: Self-pay

## 2017-07-21 NOTE — Telephone Encounter (Signed)
Returned call from patient.  Informed her of BD results.  Results are slightly better than two years ago.  Np recommends to continue calcium, vitamin d and weight bearing exercises.  Patient voiced understanding and had no questions at this time.

## 2017-07-22 DIAGNOSIS — Z23 Encounter for immunization: Secondary | ICD-10-CM | POA: Diagnosis not present

## 2017-08-13 DIAGNOSIS — I8312 Varicose veins of left lower extremity with inflammation: Secondary | ICD-10-CM | POA: Diagnosis not present

## 2017-08-13 DIAGNOSIS — L700 Acne vulgaris: Secondary | ICD-10-CM | POA: Diagnosis not present

## 2017-08-13 DIAGNOSIS — I8311 Varicose veins of right lower extremity with inflammation: Secondary | ICD-10-CM | POA: Diagnosis not present

## 2017-08-13 DIAGNOSIS — L918 Other hypertrophic disorders of the skin: Secondary | ICD-10-CM | POA: Diagnosis not present

## 2017-08-13 DIAGNOSIS — L821 Other seborrheic keratosis: Secondary | ICD-10-CM | POA: Diagnosis not present

## 2017-08-13 DIAGNOSIS — L820 Inflamed seborrheic keratosis: Secondary | ICD-10-CM | POA: Diagnosis not present

## 2017-08-13 DIAGNOSIS — D1801 Hemangioma of skin and subcutaneous tissue: Secondary | ICD-10-CM | POA: Diagnosis not present

## 2017-08-13 DIAGNOSIS — I872 Venous insufficiency (chronic) (peripheral): Secondary | ICD-10-CM | POA: Diagnosis not present

## 2017-08-13 DIAGNOSIS — L570 Actinic keratosis: Secondary | ICD-10-CM | POA: Diagnosis not present

## 2017-08-13 DIAGNOSIS — Z85828 Personal history of other malignant neoplasm of skin: Secondary | ICD-10-CM | POA: Diagnosis not present

## 2017-08-13 DIAGNOSIS — L738 Other specified follicular disorders: Secondary | ICD-10-CM | POA: Diagnosis not present

## 2017-12-24 DIAGNOSIS — F339 Major depressive disorder, recurrent, unspecified: Secondary | ICD-10-CM | POA: Diagnosis not present

## 2017-12-24 DIAGNOSIS — I10 Essential (primary) hypertension: Secondary | ICD-10-CM | POA: Diagnosis not present

## 2017-12-24 DIAGNOSIS — Z1211 Encounter for screening for malignant neoplasm of colon: Secondary | ICD-10-CM | POA: Diagnosis not present

## 2017-12-24 DIAGNOSIS — M899 Disorder of bone, unspecified: Secondary | ICD-10-CM | POA: Diagnosis not present

## 2017-12-24 DIAGNOSIS — J309 Allergic rhinitis, unspecified: Secondary | ICD-10-CM | POA: Diagnosis not present

## 2017-12-24 DIAGNOSIS — G479 Sleep disorder, unspecified: Secondary | ICD-10-CM | POA: Diagnosis not present

## 2017-12-24 DIAGNOSIS — N183 Chronic kidney disease, stage 3 (moderate): Secondary | ICD-10-CM | POA: Diagnosis not present

## 2017-12-24 DIAGNOSIS — Z853 Personal history of malignant neoplasm of breast: Secondary | ICD-10-CM | POA: Diagnosis not present

## 2017-12-24 DIAGNOSIS — Z0000 Encounter for general adult medical examination without abnormal findings: Secondary | ICD-10-CM | POA: Diagnosis not present

## 2017-12-24 DIAGNOSIS — K30 Functional dyspepsia: Secondary | ICD-10-CM | POA: Diagnosis not present

## 2017-12-24 DIAGNOSIS — Z1159 Encounter for screening for other viral diseases: Secondary | ICD-10-CM | POA: Diagnosis not present

## 2017-12-29 DIAGNOSIS — I10 Essential (primary) hypertension: Secondary | ICD-10-CM | POA: Diagnosis not present

## 2017-12-29 DIAGNOSIS — M810 Age-related osteoporosis without current pathological fracture: Secondary | ICD-10-CM | POA: Diagnosis not present

## 2017-12-29 DIAGNOSIS — G479 Sleep disorder, unspecified: Secondary | ICD-10-CM | POA: Diagnosis not present

## 2017-12-29 DIAGNOSIS — Z0000 Encounter for general adult medical examination without abnormal findings: Secondary | ICD-10-CM | POA: Diagnosis not present

## 2017-12-29 DIAGNOSIS — Z124 Encounter for screening for malignant neoplasm of cervix: Secondary | ICD-10-CM | POA: Diagnosis not present

## 2017-12-29 DIAGNOSIS — Z853 Personal history of malignant neoplasm of breast: Secondary | ICD-10-CM | POA: Diagnosis not present

## 2017-12-29 DIAGNOSIS — Z1211 Encounter for screening for malignant neoplasm of colon: Secondary | ICD-10-CM | POA: Diagnosis not present

## 2017-12-29 DIAGNOSIS — K30 Functional dyspepsia: Secondary | ICD-10-CM | POA: Diagnosis not present

## 2017-12-29 DIAGNOSIS — N183 Chronic kidney disease, stage 3 (moderate): Secondary | ICD-10-CM | POA: Diagnosis not present

## 2017-12-29 DIAGNOSIS — J309 Allergic rhinitis, unspecified: Secondary | ICD-10-CM | POA: Diagnosis not present

## 2017-12-29 DIAGNOSIS — F339 Major depressive disorder, recurrent, unspecified: Secondary | ICD-10-CM | POA: Diagnosis not present

## 2018-01-01 DIAGNOSIS — Z1211 Encounter for screening for malignant neoplasm of colon: Secondary | ICD-10-CM | POA: Diagnosis not present

## 2018-01-01 DIAGNOSIS — M170 Bilateral primary osteoarthritis of knee: Secondary | ICD-10-CM | POA: Diagnosis not present

## 2018-01-08 DIAGNOSIS — M1712 Unilateral primary osteoarthritis, left knee: Secondary | ICD-10-CM | POA: Diagnosis not present

## 2018-01-08 DIAGNOSIS — M1711 Unilateral primary osteoarthritis, right knee: Secondary | ICD-10-CM | POA: Diagnosis not present

## 2018-01-15 DIAGNOSIS — M1712 Unilateral primary osteoarthritis, left knee: Secondary | ICD-10-CM | POA: Diagnosis not present

## 2018-01-15 DIAGNOSIS — M1711 Unilateral primary osteoarthritis, right knee: Secondary | ICD-10-CM | POA: Diagnosis not present

## 2018-01-15 DIAGNOSIS — M170 Bilateral primary osteoarthritis of knee: Secondary | ICD-10-CM | POA: Diagnosis not present

## 2018-02-11 DIAGNOSIS — D225 Melanocytic nevi of trunk: Secondary | ICD-10-CM | POA: Diagnosis not present

## 2018-02-11 DIAGNOSIS — L700 Acne vulgaris: Secondary | ICD-10-CM | POA: Diagnosis not present

## 2018-02-11 DIAGNOSIS — L820 Inflamed seborrheic keratosis: Secondary | ICD-10-CM | POA: Diagnosis not present

## 2018-02-11 DIAGNOSIS — I872 Venous insufficiency (chronic) (peripheral): Secondary | ICD-10-CM | POA: Diagnosis not present

## 2018-02-11 DIAGNOSIS — I8312 Varicose veins of left lower extremity with inflammation: Secondary | ICD-10-CM | POA: Diagnosis not present

## 2018-02-11 DIAGNOSIS — I8311 Varicose veins of right lower extremity with inflammation: Secondary | ICD-10-CM | POA: Diagnosis not present

## 2018-02-11 DIAGNOSIS — L570 Actinic keratosis: Secondary | ICD-10-CM | POA: Diagnosis not present

## 2018-02-11 DIAGNOSIS — Z85828 Personal history of other malignant neoplasm of skin: Secondary | ICD-10-CM | POA: Diagnosis not present

## 2018-02-11 DIAGNOSIS — L821 Other seborrheic keratosis: Secondary | ICD-10-CM | POA: Diagnosis not present

## 2018-03-20 DIAGNOSIS — H5203 Hypermetropia, bilateral: Secondary | ICD-10-CM | POA: Diagnosis not present

## 2018-03-20 DIAGNOSIS — H40013 Open angle with borderline findings, low risk, bilateral: Secondary | ICD-10-CM | POA: Diagnosis not present

## 2018-03-20 DIAGNOSIS — H52223 Regular astigmatism, bilateral: Secondary | ICD-10-CM | POA: Diagnosis not present

## 2018-03-20 DIAGNOSIS — H1789 Other corneal scars and opacities: Secondary | ICD-10-CM | POA: Diagnosis not present

## 2018-03-20 DIAGNOSIS — H04123 Dry eye syndrome of bilateral lacrimal glands: Secondary | ICD-10-CM | POA: Diagnosis not present

## 2018-03-20 DIAGNOSIS — H35342 Macular cyst, hole, or pseudohole, left eye: Secondary | ICD-10-CM | POA: Diagnosis not present

## 2018-03-20 DIAGNOSIS — H1045 Other chronic allergic conjunctivitis: Secondary | ICD-10-CM | POA: Diagnosis not present

## 2018-04-01 ENCOUNTER — Telehealth: Payer: Self-pay | Admitting: Adult Health

## 2018-04-01 NOTE — Telephone Encounter (Signed)
Patient called to reschedule  °

## 2018-04-09 ENCOUNTER — Encounter: Payer: Medicare Other | Admitting: Adult Health

## 2018-04-17 ENCOUNTER — Inpatient Hospital Stay: Payer: Medicare Other | Attending: Adult Health | Admitting: Adult Health

## 2018-04-17 ENCOUNTER — Encounter: Payer: Self-pay | Admitting: Adult Health

## 2018-04-17 VITALS — BP 120/68 | HR 86 | Temp 98.4°F | Resp 18 | Ht 67.0 in | Wt 169.5 lb

## 2018-04-17 DIAGNOSIS — C50912 Malignant neoplasm of unspecified site of left female breast: Secondary | ICD-10-CM

## 2018-04-17 DIAGNOSIS — M85851 Other specified disorders of bone density and structure, right thigh: Secondary | ICD-10-CM | POA: Insufficient documentation

## 2018-04-17 DIAGNOSIS — Z170 Estrogen receptor positive status [ER+]: Secondary | ICD-10-CM

## 2018-04-17 DIAGNOSIS — Z923 Personal history of irradiation: Secondary | ICD-10-CM | POA: Insufficient documentation

## 2018-04-17 DIAGNOSIS — Z86000 Personal history of in-situ neoplasm of breast: Secondary | ICD-10-CM | POA: Insufficient documentation

## 2018-04-17 NOTE — Patient Instructions (Signed)
Bone Health Bones protect organs, store calcium, and anchor muscles. Good health habits, such as eating nutritious foods and exercising regularly, are important for maintaining healthy bones. They can also help to prevent a condition that causes bones to lose density and become weak and brittle (osteoporosis). Why is bone mass important? Bone mass refers to the amount of bone tissue that you have. The higher your bone mass, the stronger your bones. An important step toward having healthy bones throughout life is to have strong and dense bones during childhood. A young adult who has a high bone mass is more likely to have a high bone mass later in life. Bone mass at its greatest it is called peak bone mass. A large decline in bone mass occurs in older adults. In women, it occurs about the time of menopause. During this time, it is important to practice good health habits, because if more bone is lost than what is replaced, the bones will become less healthy and more likely to break (fracture). If you find that you have a low bone mass, you may be able to prevent osteoporosis or further bone loss by changing your diet and lifestyle. How can I find out if my bone mass is low? Bone mass can be measured with an X-ray test that is called a bone mineral density (BMD) test. This test is recommended for all women who are age 65 or older. It may also be recommended for men who are age 70 or older, or for people who are more likely to develop osteoporosis due to:  Having bones that break easily.  Having a long-term disease that weakens bones, such as kidney disease or rheumatoid arthritis.  Having menopause earlier than normal.  Taking medicine that weakens bones, such as steroids, thyroid hormones, or hormone treatment for breast cancer or prostate cancer.  Smoking.  Drinking three or more alcoholic drinks each day.  What are the nutritional recommendations for healthy bones? To have healthy bones, you  need to get enough of the right minerals and vitamins. Most nutrition experts recommend getting these nutrients from the foods that you eat. Nutritional recommendations vary from person to person. Ask your health care provider what is healthy for you. Here are some general guidelines. Calcium Recommendations Calcium is the most important (essential) mineral for bone health. Most people can get enough calcium from their diet, but supplements may be recommended for people who are at risk for osteoporosis. Good sources of calcium include:  Dairy products, such as low-fat or nonfat milk, cheese, and yogurt.  Dark green leafy vegetables, such as bok choy and broccoli.  Calcium-fortified foods, such as orange juice, cereal, bread, soy beverages, and tofu products.  Nuts, such as almonds.  Follow these recommended amounts for daily calcium intake:  Children, age 1?3: 700 mg.  Children, age 4?8: 1,000 mg.  Children, age 9?13: 1,300 mg.  Teens, age 14?18: 1,300 mg.  Adults, age 19?50: 1,000 mg.  Adults, age 51?70: ? Men: 1,000 mg. ? Women: 1,200 mg.  Adults, age 71 or older: 1,200 mg.  Pregnant and breastfeeding females: ? Teens: 1,300 mg. ? Adults: 1,000 mg.  Vitamin D Recommendations Vitamin D is the most essential vitamin for bone health. It helps the body to absorb calcium. Sunlight stimulates the skin to make vitamin D, so be sure to get enough sunlight. If you live in a cold climate or you do not get outside often, your health care provider may recommend that you take vitamin   D supplements. Good sources of vitamin D in your diet include:  Egg yolks.  Saltwater fish.  Milk and cereal fortified with vitamin D.  Follow these recommended amounts for daily vitamin D intake:  Children and teens, age 1?18: 600 international units.  Adults, age 50 or younger: 400-800 international units.  Adults, age 51 or older: 800-1,000 international units.  Other Nutrients Other nutrients  for bone health include:  Phosphorus. This mineral is found in meat, poultry, dairy foods, nuts, and legumes. The recommended daily intake for adult men and adult women is 700 mg.  Magnesium. This mineral is found in seeds, nuts, dark green vegetables, and legumes. The recommended daily intake for adult men is 400?420 mg. For adult women, it is 310?320 mg.  Vitamin K. This vitamin is found in green leafy vegetables. The recommended daily intake is 120 mg for adult men and 90 mg for adult women.  What type of physical activity is best for building and maintaining healthy bones? Weight-bearing and strength-building activities are important for building and maintaining peak bone mass. Weight-bearing activities cause muscles and bones to work against gravity. Strength-building activities increases muscle strength that supports bones. Weight-bearing and muscle-building activities include:  Walking and hiking.  Jogging and running.  Dancing.  Gym exercises.  Lifting weights.  Tennis and racquetball.  Climbing stairs.  Aerobics.  Adults should get at least 30 minutes of moderate physical activity on most days. Children should get at least 60 minutes of moderate physical activity on most days. Ask your health care provide what type of exercise is best for you. Where can I find more information? For more information, check out the following websites:  National Osteoporosis Foundation: http://nof.org/learn/basics  National Institutes of Health: http://www.niams.nih.gov/Health_Info/Bone/Bone_Health/bone_health_for_life.asp  This information is not intended to replace advice given to you by your health care provider. Make sure you discuss any questions you have with your health care provider. Document Released: 12/28/2003 Document Revised: 04/26/2016 Document Reviewed: 10/12/2014 Elsevier Interactive Patient Education  2018 Elsevier Inc.  

## 2018-04-17 NOTE — Progress Notes (Signed)
CLINIC:  Survivorship   REASON FOR VISIT:  Routine follow-up for history of breast cancer.   BRIEF ONCOLOGIC HISTORY:    Breast cancer, left breast ([place])   01/11/2010 Initial Biopsy    Right breas lumpectomy: DCIS, fibrocystic changes with calcifications; [facility]      06/15/2010 Procedure    Left breast biopsy: Atypical ductal hyperplasia and fibrocystic changes       - 2011 Radiation Therapy    Adjuvant radiation therapy      06/20/2010 - 03/27/2015 Anti-estrogen oral therapy    Tamoxifen 20 mg daily      06/27/2010 Surgery    Left breast lumpectomy: No malignancy found fibrocystic changes, benign        INTERVAL HISTORY:  Morgan Elliott presents to the [facility] today for routine follow-up for her history of breast cancer.  Overall, she reports feeling quite well. Morgan Elliott continues to see her PCP regularly, and is up to date with her health maintenance including colon screening, skin screening, and her mammograms.  She has graduated from needing pap smears, but does get pelvic exams regularly.    She is doing well today and is without any questions or concerns.  She exercises some.     REVIEW OF SYSTEMS:  Review of Systems  Constitutional: Negative for appetite change, chills, fatigue, fever and unexpected weight change.  HENT:   Negative for hearing loss, lump/mass and trouble swallowing.   Eyes: Negative for eye problems and icterus.  Respiratory: Negative for chest tightness, cough and shortness of breath.   Cardiovascular: Negative for chest pain, leg swelling and palpitations.  Gastrointestinal: Negative for abdominal distention, abdominal pain, constipation, diarrhea, nausea and vomiting.  Endocrine: Negative for hot flashes.  Musculoskeletal: Negative for arthralgias.  Skin: Negative for itching and rash.  Neurological: Negative for dizziness, extremity weakness, headaches and numbness.  Hematological: Negative for adenopathy. Does not bruise/bleed easily.    Psychiatric/Behavioral: Negative for depression. The patient is not nervous/anxious.   Breast: Denies any new nodularity, masses, tenderness, nipple changes, or nipple discharge.       PAST MEDICAL/SURGICAL HISTORY:  Past Medical History:  Diagnosis Date  . Anxiety   . Arthritis    knees, hands, hip  . 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 Breast cancer ([place])   . Chronic kidney disease    recent dx of stage 3A Chronic kidney disease  . 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 Depression   . GERD (gastroesophageal reflux disease)   . H/O hiatal hernia   . Hematoma    right breast  . Hypertension    borderline  . IBS (irritable bowel syndrome)   . Osteopenia   . Pneumonia   . SVD (spontaneous vaginal delivery)    x 1  . Varicose veins of both lower extremities    Past Surgical History:  Procedure Laterality Date  . BREAST BIOPSY  5/08, 12/2009    x 2 left  . BREAST BIOPSY Right 05/2014   Benign   . BREAST LUMPECTOMY  06/2010   x 2 right   . CERVICAL FUSION  5/04   C3-C6  . COLONOSCOPY    . DIAGNOSTIC LAPAROSCOPY     tubal ligation  . DILATATION & CURRETTAGE/HYSTEROSCOPY WITH RESECTOCOPE N/A 01/11/2014   Procedure: [place];  Surgeon: Lyman Speller, MD;  Location: [facility];  Service: Gynecology;  Laterality: N/A;  resection of endometrial polyp  . DILATION AND CURETTAGE  OF UTERUS     x3 for heavy bleeding  . EYE SURGERY     lasik left eye  . KNEE ARTHROSCOPY  2012   left  . TOTAL HIP ARTHROPLASTY Left 06/05/2016   Procedure: LEFT TOTAL HIP ARTHROPLASTY ANTERIOR APPROACH;  Surgeon: Gaynelle Arabian, MD;  Location: [facility];  Service: Orthopedics;  Laterality: Left;  . TUBAL LIGATION     and lap  . UPPER GI ENDOSCOPY  ~2013     ALLERGIES:  No Known Allergies   CURRENT MEDICATIONS:  Outpatient Encounter Medications as of 04/17/2018  Medication Sig  . alendronate (FOSAMAX) 70 MG tablet   .  amLODipine (NORVASC) 5 MG tablet Take 5 mg by mouth daily.  . cetirizine (ZYRTEC) 10 MG tablet Take 10 mg by mouth at bedtime.   . citalopram (CELEXA) 20 MG tablet Take 1 tablet (20 mg total) by mouth daily.  . fluticasone (FLONASE) 50 MCG/ACT nasal spray Place 1 spray into both nostrils daily as needed for allergies. Reported on 04/09/2016  . losartan (COZAAR) 50 MG tablet Take 25 mg by mouth daily. Pt takes half of a 50 mg tablet.  Marland Kitchen OVER THE COUNTER MEDICATION Take 1 tablet by mouth daily as needed (ib guard for IBS).  Marland Kitchen ranitidine (ZANTAC) 300 MG tablet Take 150 mg by mouth at bedtime.   . zaleplon (SONATA) 5 MG capsule TAKE 1 CAPSULE AT BEDTIME AS NEEDED   No facility-administered encounter medications on file as of 04/17/2018.      ONCOLOGIC FAMILY HISTORY:  Family History  Problem Relation Age of Onset  . Hypertension Mother   . Heart attack Mother   . Hypertension Father   . Cancer Maternal Aunt 84       leukemia, Breast cancer  . Breast cancer Sister 57  . Cancer Paternal Grandfather 31       GI related      SOCIAL HISTORY:  Social History   Socioeconomic History  . Marital status: Married    Spouse name: Not on file  . Number of children: Not on file  . Years of education: Not on file  . Highest education level: Not on file  Occupational History  . Not on file  Social Needs  . Financial resource strain: Not on file  . Food insecurity:    Worry: Not on file    Inability: Not on file  . Transportation needs:    Medical: Not on file    Non-medical: Not on file  Tobacco Use  . Smoking status: Never Smoker  . Smokeless tobacco: Never Used  Substance and Sexual Activity  . Alcohol use: Yes    Alcohol/week: 4.2 oz    Types: 7 Glasses of wine per week    Comment: wine daily   . Drug use: No  . Sexual activity: Yes    Birth control/protection: Post-menopausal, Surgical  Lifestyle  . Physical activity:    Days per week: Not on file    Minutes per session:  Not on file  . Stress: Not on file  Relationships  . Social connections:    Talks on phone: Not on file    Gets together: Not on file    Attends religious service: Not on file    Active member of club or organization: Not on file    Attends meetings of clubs or organizations: Not on file    Relationship status: Not on file  . Intimate partner violence:    Fear of current  or ex partner: Not on file    Emotionally abused: Not on file    Physically abused: Not on file    Forced sexual activity: Not on file  Other Topics Concern  . Not on file  Social History Narrative  . Not on file      PHYSICAL EXAMINATION:  Vital Signs: Vitals:   04/17/18 1454  BP: 120/68  Pulse: 86  Resp: 18  Temp: 98.4 F (36.9 C)  SpO2: 97%   Filed Weights   04/17/18 1454  Weight: 169 lb 8 oz (76.9 kg)   General: Well-nourished, well-appearing female in no acute distress.  Unaccompanied today.   HEENT: Head is normocephalic.  Pupils equal and reactive to light. Conjunctivae clear without exudate.  Sclerae anicteric. Oral mucosa is pink, moist.  Oropharynx is pink without lesions or erythema.  Lymph: No cervical, supraclavicular, or infraclavicular lymphadenopathy noted on palpation.  Cardiovascular: Regular rate and rhythm.Marland Kitchen Respiratory: Clear to auscultation bilaterally. Chest expansion symmetric; breathing non-labored.  Breast Exam:  -Left breast: No appreciable masses on palpation. No skin redness, thickening, or peau d'orange appearance; no nipple retraction or nipple discharge; mild distortion in symmetry at previous lumpectomy site well healed scar without erythema or nodularity.  -Right breast: No appreciable masses on palpation. No skin redness, thickening, or peau d'orange appearance; no nipple retraction or nipple discharge;-Axilla: No axillary adenopathy bilaterally.  GI: Abdomen soft and round; non-tender, non-distended. Bowel sounds normoactive. No hepatosplenomegaly.   GU: Deferred.    Neuro: No focal deficits. Steady gait.  Psych: Mood and affect normal and appropriate for situation.  MSK: No focal spinal tenderness to palpation, full range of motion in bilateral upper extremities Extremities: No edema. Skin: Warm and dry.  LABORATORY DATA:  None for this visit   DIAGNOSTIC IMAGING:  Most recent mammogram:    Most recent bone density:     ASSESSMENT AND PLAN:  Ms.. Elliott is a pleasant 72 y.o. 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  She [facility] for surveillance and routine follow-up.   1. History of breast cancer:  Morgan Elliott is currently clinically and radiographically without evidence of disease or recurrence of breast cancer. She will be due for mammogram in 06/2018; orders placed today.  After discussion, Morgan Elliott and I decided that she will f/u with her PCP from now on for continued surveillance.  2. Bone health:  Given Morgan Elliott's age, history of breast cancer, she is at risk for bone demineralization. Her last DEXA scan was on 10/2016 and was consistent with osteopenia with a T score of -2.1 in the right femur.   She was given education on specific food and activities to promote bone health.  3. Cancer screening:  Due to Morgan Elliott's history and her age, she should receive screening for skin cancers, colon cancer. She was encouraged to follow-up with her PCP for appropriate cancer screenings.   4. Health maintenance and wellness promotion: Morgan Elliott was encouraged to consume 5-7 servings of fruits and vegetables per day. She was also encouraged to engage in moderate to vigorous exercise for 30 minutes per day most days of the week. She was instructed to limit her alcohol consumption and continue to abstain from tobacco use.    Dispo:  -Return to cancer center  PRN -Mammogram due in 06/2018 -Bone density in 06/2019  A total of (20) minutes of face-to-face time was spent with this patient with greater than 50% of that time in counseling and care-coordination.   Gardenia Phlegm, NP Survivorship Program [facility] [phone]   Note: PRIMARY CARE PROVIDER Cari Caraway, [place] [phone]

## 2018-04-20 ENCOUNTER — Telehealth: Payer: Self-pay | Admitting: Adult Health

## 2018-04-20 NOTE — Telephone Encounter (Signed)
Per  6/28 no los

## 2018-05-25 ENCOUNTER — Other Ambulatory Visit: Payer: Self-pay | Admitting: Family Medicine

## 2018-05-25 DIAGNOSIS — Z1231 Encounter for screening mammogram for malignant neoplasm of breast: Secondary | ICD-10-CM

## 2018-05-29 DIAGNOSIS — M7742 Metatarsalgia, left foot: Secondary | ICD-10-CM | POA: Diagnosis not present

## 2018-05-29 DIAGNOSIS — M79671 Pain in right foot: Secondary | ICD-10-CM | POA: Diagnosis not present

## 2018-05-29 DIAGNOSIS — M7741 Metatarsalgia, right foot: Secondary | ICD-10-CM | POA: Diagnosis not present

## 2018-05-29 DIAGNOSIS — M79672 Pain in left foot: Secondary | ICD-10-CM | POA: Diagnosis not present

## 2018-06-10 DIAGNOSIS — L820 Inflamed seborrheic keratosis: Secondary | ICD-10-CM | POA: Diagnosis not present

## 2018-06-10 DIAGNOSIS — Z85828 Personal history of other malignant neoplasm of skin: Secondary | ICD-10-CM | POA: Diagnosis not present

## 2018-06-29 ENCOUNTER — Ambulatory Visit
Admission: RE | Admit: 2018-06-29 | Discharge: 2018-06-29 | Disposition: A | Discharge status: Home / Self Care | Payer: Medicare Other | Source: Ambulatory Visit | Attending: Family Medicine | Admitting: Family Medicine

## 2018-06-29 DIAGNOSIS — M79672 Pain in left foot: Secondary | ICD-10-CM | POA: Diagnosis not present

## 2018-06-29 DIAGNOSIS — Z1231 Encounter for screening mammogram for malignant neoplasm of breast: Secondary | ICD-10-CM

## 2018-06-29 DIAGNOSIS — M79671 Pain in right foot: Secondary | ICD-10-CM | POA: Diagnosis not present

## 2018-06-29 DIAGNOSIS — M7742 Metatarsalgia, left foot: Secondary | ICD-10-CM | POA: Diagnosis not present

## 2018-06-29 DIAGNOSIS — M7741 Metatarsalgia, right foot: Secondary | ICD-10-CM | POA: Diagnosis not present

## 2018-07-01 ENCOUNTER — Other Ambulatory Visit: Payer: Self-pay | Admitting: Family Medicine

## 2018-07-01 DIAGNOSIS — R928 Other abnormal and inconclusive findings on diagnostic imaging of breast: Secondary | ICD-10-CM

## 2018-07-02 DIAGNOSIS — J309 Allergic rhinitis, unspecified: Secondary | ICD-10-CM | POA: Diagnosis not present

## 2018-07-02 DIAGNOSIS — Z853 Personal history of malignant neoplasm of breast: Secondary | ICD-10-CM | POA: Diagnosis not present

## 2018-07-02 DIAGNOSIS — F3342 Major depressive disorder, recurrent, in full remission: Secondary | ICD-10-CM | POA: Diagnosis not present

## 2018-07-02 DIAGNOSIS — G479 Sleep disorder, unspecified: Secondary | ICD-10-CM | POA: Diagnosis not present

## 2018-07-02 DIAGNOSIS — I129 Hypertensive chronic kidney disease with stage 1 through stage 4 chronic kidney disease, or unspecified chronic kidney disease: Secondary | ICD-10-CM | POA: Diagnosis not present

## 2018-07-02 DIAGNOSIS — N183 Chronic kidney disease, stage 3 (moderate): Secondary | ICD-10-CM | POA: Diagnosis not present

## 2018-07-02 DIAGNOSIS — Z23 Encounter for immunization: Secondary | ICD-10-CM | POA: Diagnosis not present

## 2018-07-02 DIAGNOSIS — M810 Age-related osteoporosis without current pathological fracture: Secondary | ICD-10-CM | POA: Diagnosis not present

## 2018-07-03 ENCOUNTER — Ambulatory Visit
Admission: RE | Admit: 2018-07-03 | Discharge: 2018-07-03 | Disposition: A | Discharge status: Home / Self Care | Payer: Medicare Other | Source: Ambulatory Visit | Attending: Family Medicine | Admitting: Family Medicine

## 2018-07-03 ENCOUNTER — Ambulatory Visit: Payer: Medicare Other

## 2018-07-03 DIAGNOSIS — R928 Other abnormal and inconclusive findings on diagnostic imaging of breast: Secondary | ICD-10-CM

## 2018-07-03 DIAGNOSIS — R921 Mammographic calcification found on diagnostic imaging of breast: Secondary | ICD-10-CM | POA: Diagnosis not present

## 2018-08-10 DIAGNOSIS — M7742 Metatarsalgia, left foot: Secondary | ICD-10-CM | POA: Diagnosis not present

## 2018-08-10 DIAGNOSIS — M79672 Pain in left foot: Secondary | ICD-10-CM | POA: Diagnosis not present

## 2018-08-20 DIAGNOSIS — M79672 Pain in left foot: Secondary | ICD-10-CM | POA: Diagnosis not present

## 2018-09-02 DIAGNOSIS — G5762 Lesion of plantar nerve, left lower limb: Secondary | ICD-10-CM | POA: Diagnosis not present

## 2018-09-02 DIAGNOSIS — M7752 Other enthesopathy of left foot: Secondary | ICD-10-CM | POA: Diagnosis not present

## 2018-09-15 DIAGNOSIS — L298 Other pruritus: Secondary | ICD-10-CM | POA: Diagnosis not present

## 2018-09-15 DIAGNOSIS — L700 Acne vulgaris: Secondary | ICD-10-CM | POA: Diagnosis not present

## 2018-09-15 DIAGNOSIS — L817 Pigmented purpuric dermatosis: Secondary | ICD-10-CM | POA: Diagnosis not present

## 2018-09-15 DIAGNOSIS — L821 Other seborrheic keratosis: Secondary | ICD-10-CM | POA: Diagnosis not present

## 2018-09-15 DIAGNOSIS — L738 Other specified follicular disorders: Secondary | ICD-10-CM | POA: Diagnosis not present

## 2018-09-15 DIAGNOSIS — D1801 Hemangioma of skin and subcutaneous tissue: Secondary | ICD-10-CM | POA: Diagnosis not present

## 2018-09-15 DIAGNOSIS — Z85828 Personal history of other malignant neoplasm of skin: Secondary | ICD-10-CM | POA: Diagnosis not present

## 2018-09-25 DIAGNOSIS — H43822 Vitreomacular adhesion, left eye: Secondary | ICD-10-CM | POA: Diagnosis not present

## 2018-09-25 DIAGNOSIS — H52222 Regular astigmatism, left eye: Secondary | ICD-10-CM | POA: Diagnosis not present

## 2018-09-25 DIAGNOSIS — H35372 Puckering of macula, left eye: Secondary | ICD-10-CM | POA: Diagnosis not present

## 2018-09-25 DIAGNOSIS — H5202 Hypermetropia, left eye: Secondary | ICD-10-CM | POA: Diagnosis not present

## 2018-09-30 DIAGNOSIS — M179 Osteoarthritis of knee, unspecified: Secondary | ICD-10-CM | POA: Diagnosis not present

## 2018-09-30 DIAGNOSIS — M170 Bilateral primary osteoarthritis of knee: Secondary | ICD-10-CM | POA: Diagnosis not present

## 2018-10-07 DIAGNOSIS — M170 Bilateral primary osteoarthritis of knee: Secondary | ICD-10-CM | POA: Diagnosis not present

## 2018-10-15 DIAGNOSIS — M170 Bilateral primary osteoarthritis of knee: Secondary | ICD-10-CM | POA: Diagnosis not present

## 2018-11-04 DIAGNOSIS — G5762 Lesion of plantar nerve, left lower limb: Secondary | ICD-10-CM | POA: Diagnosis not present

## 2018-11-04 DIAGNOSIS — M7752 Other enthesopathy of left foot: Secondary | ICD-10-CM | POA: Diagnosis not present

## 2019-01-05 DIAGNOSIS — F3342 Major depressive disorder, recurrent, in full remission: Secondary | ICD-10-CM | POA: Diagnosis not present

## 2019-01-05 DIAGNOSIS — J309 Allergic rhinitis, unspecified: Secondary | ICD-10-CM | POA: Diagnosis not present

## 2019-01-05 DIAGNOSIS — G479 Sleep disorder, unspecified: Secondary | ICD-10-CM | POA: Diagnosis not present

## 2019-01-05 DIAGNOSIS — Z0000 Encounter for general adult medical examination without abnormal findings: Secondary | ICD-10-CM | POA: Diagnosis not present

## 2019-01-05 DIAGNOSIS — Z853 Personal history of malignant neoplasm of breast: Secondary | ICD-10-CM | POA: Diagnosis not present

## 2019-01-05 DIAGNOSIS — Z23 Encounter for immunization: Secondary | ICD-10-CM | POA: Diagnosis not present

## 2019-01-05 DIAGNOSIS — M810 Age-related osteoporosis without current pathological fracture: Secondary | ICD-10-CM | POA: Diagnosis not present

## 2019-01-05 DIAGNOSIS — N183 Chronic kidney disease, stage 3 (moderate): Secondary | ICD-10-CM | POA: Diagnosis not present

## 2019-01-05 DIAGNOSIS — K30 Functional dyspepsia: Secondary | ICD-10-CM | POA: Diagnosis not present

## 2019-01-05 DIAGNOSIS — I129 Hypertensive chronic kidney disease with stage 1 through stage 4 chronic kidney disease, or unspecified chronic kidney disease: Secondary | ICD-10-CM | POA: Diagnosis not present

## 2019-01-08 ENCOUNTER — Other Ambulatory Visit: Payer: Self-pay | Admitting: Family Medicine

## 2019-01-08 DIAGNOSIS — Z1231 Encounter for screening mammogram for malignant neoplasm of breast: Secondary | ICD-10-CM

## 2019-01-08 DIAGNOSIS — M810 Age-related osteoporosis without current pathological fracture: Secondary | ICD-10-CM

## 2019-04-13 DIAGNOSIS — H52223 Regular astigmatism, bilateral: Secondary | ICD-10-CM | POA: Diagnosis not present

## 2019-04-13 DIAGNOSIS — H5203 Hypermetropia, bilateral: Secondary | ICD-10-CM | POA: Diagnosis not present

## 2019-04-13 DIAGNOSIS — H25813 Combined forms of age-related cataract, bilateral: Secondary | ICD-10-CM | POA: Diagnosis not present

## 2019-04-13 DIAGNOSIS — H35372 Puckering of macula, left eye: Secondary | ICD-10-CM | POA: Diagnosis not present

## 2019-04-13 DIAGNOSIS — H524 Presbyopia: Secondary | ICD-10-CM | POA: Diagnosis not present

## 2019-04-13 DIAGNOSIS — H43822 Vitreomacular adhesion, left eye: Secondary | ICD-10-CM | POA: Diagnosis not present

## 2019-05-03 IMAGING — MG DIGITAL SCREENING BILATERAL MAMMOGRAM WITH TOMO AND CAD
8 series · 8 of 24 positions shown · non-contrast
Comparison: Previous exam(s).

CLINICAL DATA: Screening.

EXAM:
DIGITAL SCREENING BILATERAL MAMMOGRAM WITH TOMO AND CAD

[L MLO synth-2D]
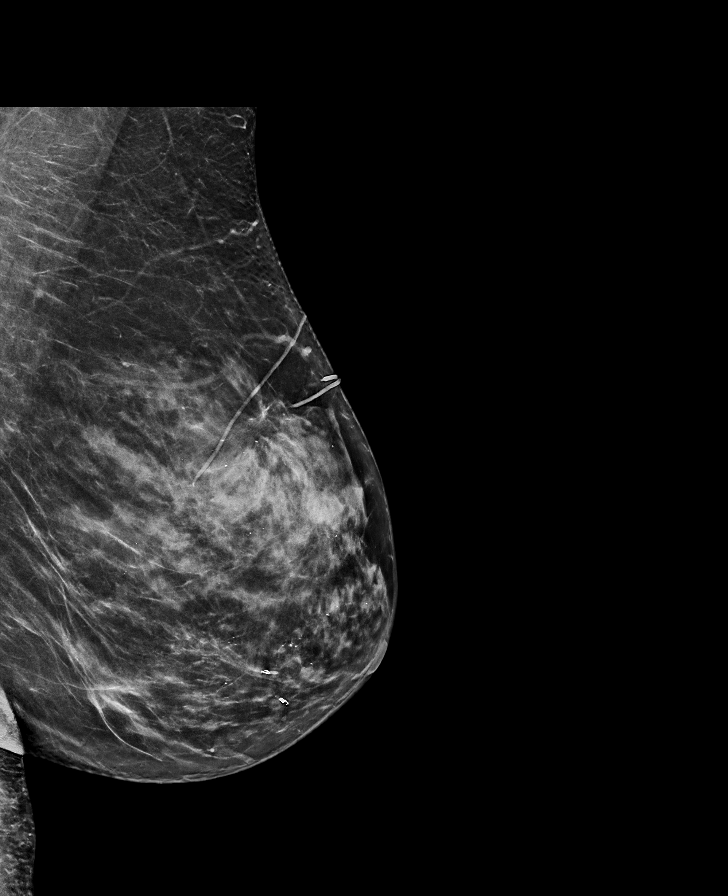

[R CC synth-2D]
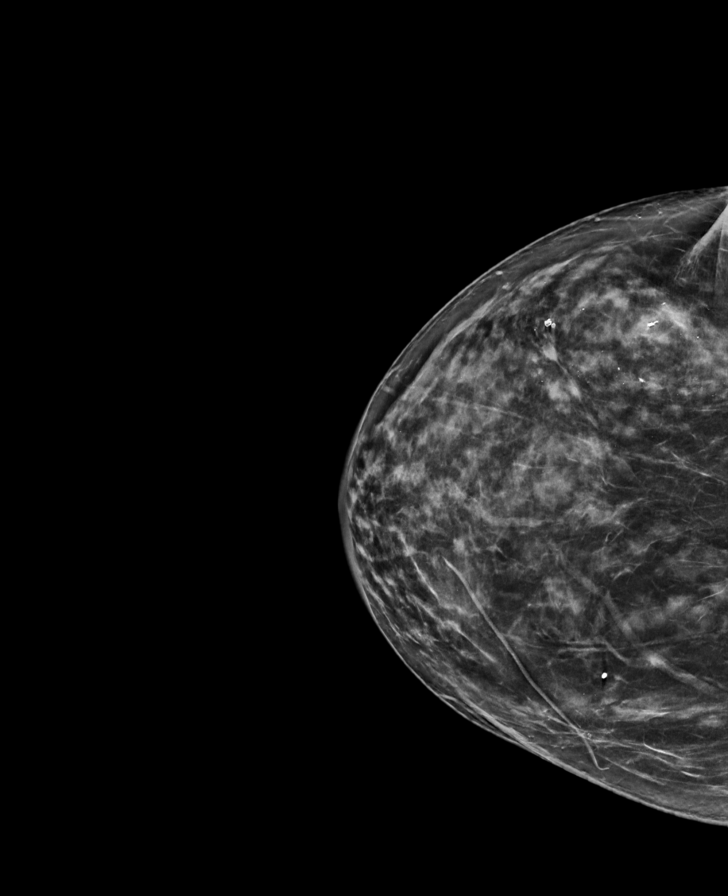

[R MLO synth-2D]
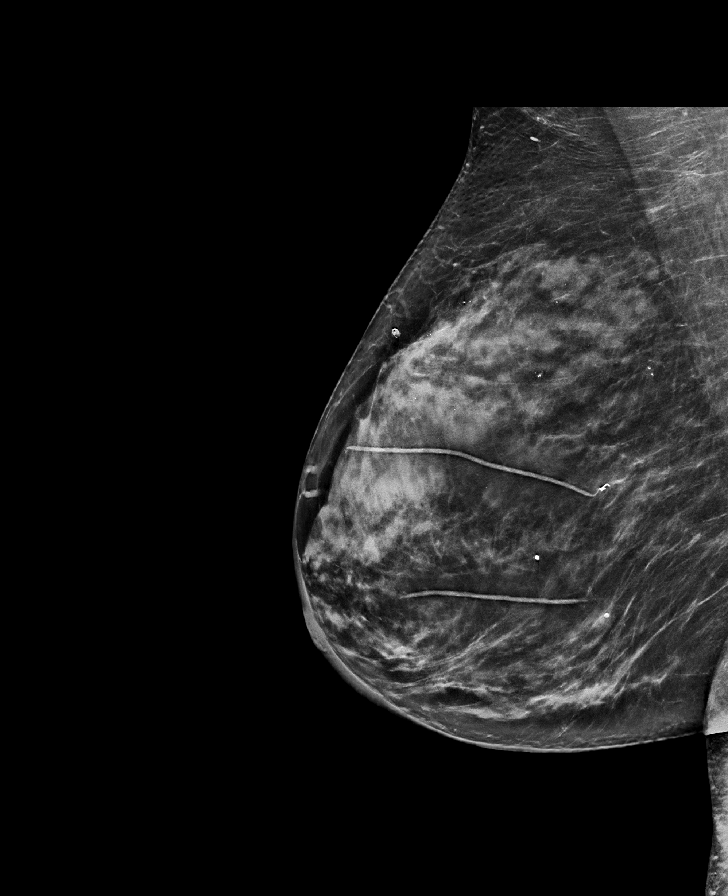

[L CC synth-2D]
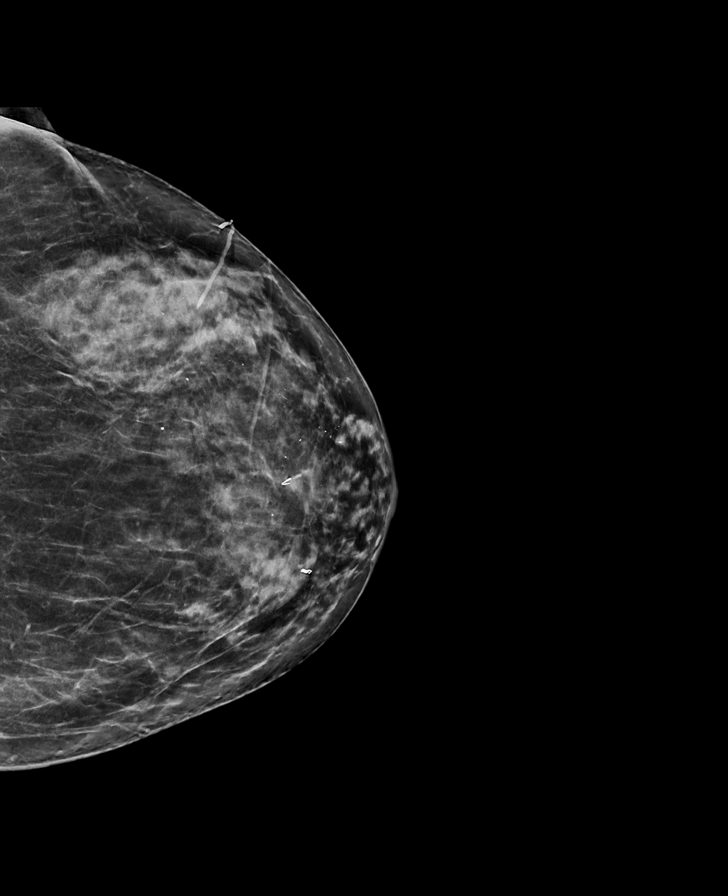

[R CC tomo · tomo slice 37/73.0]
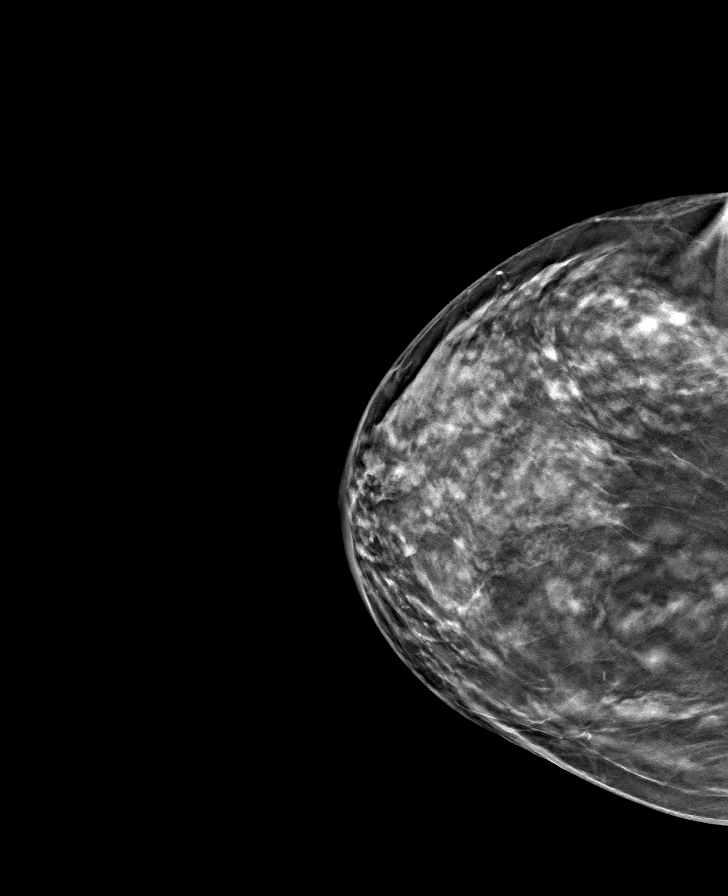

[R MLO tomo · tomo slice 38/75.0]
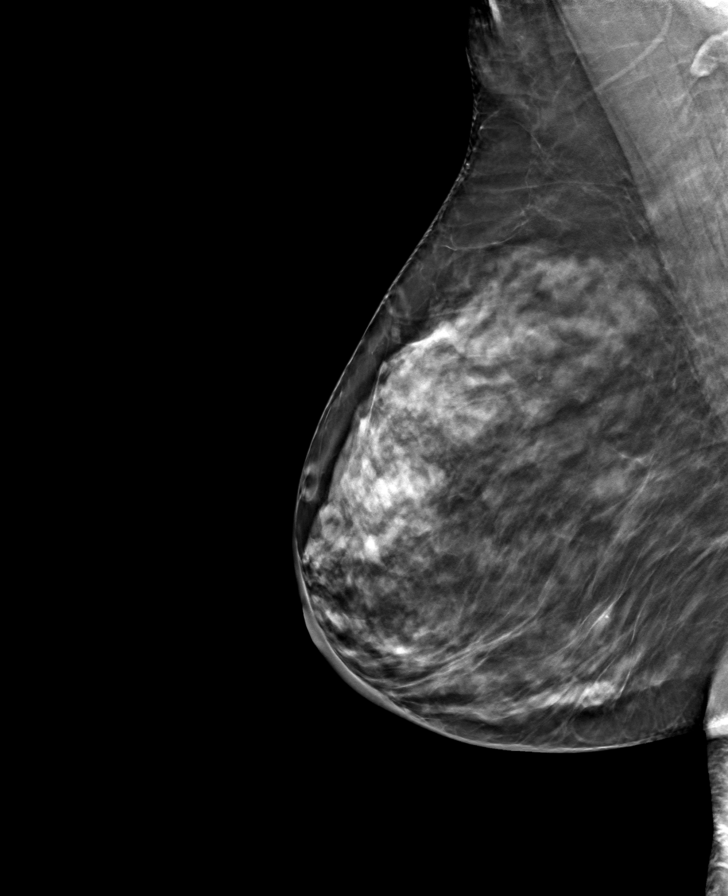

[L MLO tomo · tomo slice 38/75.0]
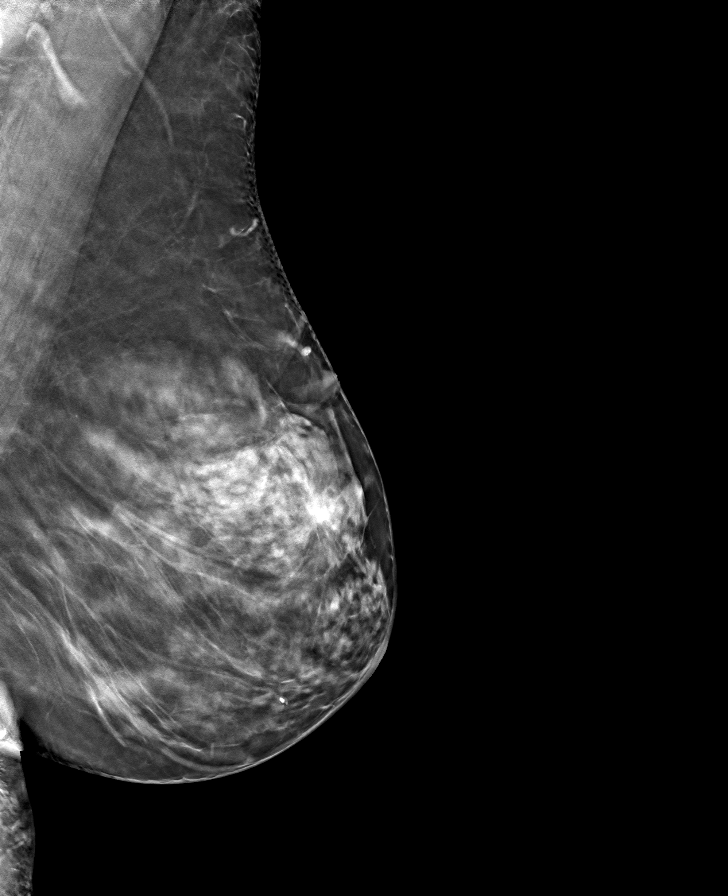

[L CC tomo · tomo slice 37/74.0]
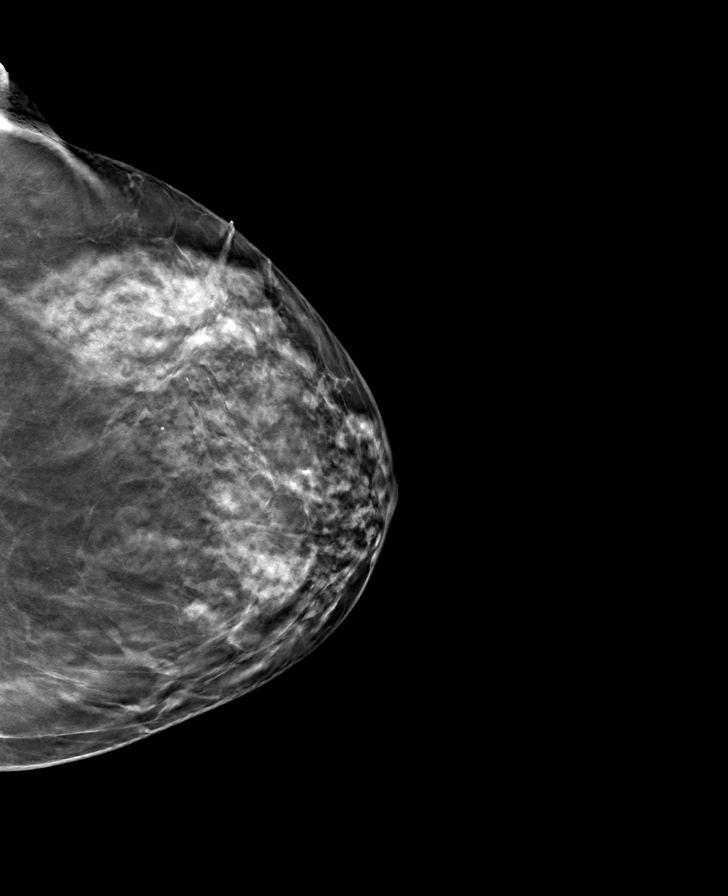

[8 of 24 positions shown; findings below may reference images not displayed]

ACR Breast Density Category c: The breast tissue is heterogeneously
dense, which may obscure small masses.
FINDINGS: In the right breast calcifications requires further evaluation.

In the left breast calcifications requires further evaluation.

Images were processed with CAD.
IMPRESSION: Further evaluation is suggested for possible calcifications in the
right breast.

Further evaluation is suggested for possible calcifications in the
left breast.

RECOMMENDATION:
Diagnostic mammogram and possibly ultrasound of both breasts.
(Code:[id number])

The patient will be contacted regarding the findings, and additional
imaging will be scheduled.

BI-RADS CATEGORY  0: Incomplete. Need additional imaging evaluation
and/or prior mammograms for comparison.

## 2019-07-05 DIAGNOSIS — M25561 Pain in right knee: Secondary | ICD-10-CM | POA: Diagnosis not present

## 2019-07-05 DIAGNOSIS — M170 Bilateral primary osteoarthritis of knee: Secondary | ICD-10-CM | POA: Diagnosis not present

## 2019-07-05 DIAGNOSIS — M25562 Pain in left knee: Secondary | ICD-10-CM | POA: Diagnosis not present

## 2019-07-06 ENCOUNTER — Ambulatory Visit
Admission: RE | Admit: 2019-07-06 | Discharge: 2019-07-06 | Disposition: A | Discharge status: Home / Self Care | Payer: Medicare Other | Source: Ambulatory Visit | Attending: Family Medicine | Admitting: Family Medicine

## 2019-07-06 ENCOUNTER — Other Ambulatory Visit: Payer: Self-pay

## 2019-07-06 DIAGNOSIS — M810 Age-related osteoporosis without current pathological fracture: Secondary | ICD-10-CM

## 2019-07-06 DIAGNOSIS — Z780 Asymptomatic menopausal state: Secondary | ICD-10-CM | POA: Diagnosis not present

## 2019-07-06 DIAGNOSIS — Z1231 Encounter for screening mammogram for malignant neoplasm of breast: Secondary | ICD-10-CM | POA: Diagnosis not present

## 2019-07-06 DIAGNOSIS — M85851 Other specified disorders of bone density and structure, right thigh: Secondary | ICD-10-CM | POA: Diagnosis not present

## 2019-07-08 DIAGNOSIS — I129 Hypertensive chronic kidney disease with stage 1 through stage 4 chronic kidney disease, or unspecified chronic kidney disease: Secondary | ICD-10-CM | POA: Diagnosis not present

## 2019-07-08 DIAGNOSIS — N183 Chronic kidney disease, stage 3 (moderate): Secondary | ICD-10-CM | POA: Diagnosis not present

## 2019-07-08 DIAGNOSIS — Z23 Encounter for immunization: Secondary | ICD-10-CM | POA: Diagnosis not present

## 2019-07-08 DIAGNOSIS — F3342 Major depressive disorder, recurrent, in full remission: Secondary | ICD-10-CM | POA: Diagnosis not present

## 2019-07-08 DIAGNOSIS — G479 Sleep disorder, unspecified: Secondary | ICD-10-CM | POA: Diagnosis not present

## 2019-07-08 DIAGNOSIS — Z13220 Encounter for screening for lipoid disorders: Secondary | ICD-10-CM | POA: Diagnosis not present

## 2019-07-08 DIAGNOSIS — J309 Allergic rhinitis, unspecified: Secondary | ICD-10-CM | POA: Diagnosis not present

## 2019-07-08 DIAGNOSIS — K30 Functional dyspepsia: Secondary | ICD-10-CM | POA: Diagnosis not present

## 2019-07-08 DIAGNOSIS — Z853 Personal history of malignant neoplasm of breast: Secondary | ICD-10-CM | POA: Diagnosis not present

## 2019-07-08 DIAGNOSIS — M810 Age-related osteoporosis without current pathological fracture: Secondary | ICD-10-CM | POA: Diagnosis not present

## 2019-07-12 DIAGNOSIS — M25561 Pain in right knee: Secondary | ICD-10-CM | POA: Diagnosis not present

## 2019-07-12 DIAGNOSIS — M25562 Pain in left knee: Secondary | ICD-10-CM | POA: Diagnosis not present

## 2019-07-12 DIAGNOSIS — M170 Bilateral primary osteoarthritis of knee: Secondary | ICD-10-CM | POA: Diagnosis not present

## 2019-07-19 DIAGNOSIS — Z23 Encounter for immunization: Secondary | ICD-10-CM | POA: Diagnosis not present

## 2019-07-20 DIAGNOSIS — M25562 Pain in left knee: Secondary | ICD-10-CM | POA: Diagnosis not present

## 2019-07-20 DIAGNOSIS — L821 Other seborrheic keratosis: Secondary | ICD-10-CM | POA: Diagnosis not present

## 2019-07-20 DIAGNOSIS — L570 Actinic keratosis: Secondary | ICD-10-CM | POA: Diagnosis not present

## 2019-07-20 DIAGNOSIS — M25561 Pain in right knee: Secondary | ICD-10-CM | POA: Diagnosis not present

## 2019-07-20 DIAGNOSIS — L820 Inflamed seborrheic keratosis: Secondary | ICD-10-CM | POA: Diagnosis not present

## 2019-07-20 DIAGNOSIS — Z85828 Personal history of other malignant neoplasm of skin: Secondary | ICD-10-CM | POA: Diagnosis not present

## 2019-07-20 DIAGNOSIS — D485 Neoplasm of uncertain behavior of skin: Secondary | ICD-10-CM | POA: Diagnosis not present

## 2019-11-19 DIAGNOSIS — Z853 Personal history of malignant neoplasm of breast: Secondary | ICD-10-CM | POA: Diagnosis not present

## 2019-11-19 DIAGNOSIS — M810 Age-related osteoporosis without current pathological fracture: Secondary | ICD-10-CM | POA: Diagnosis not present

## 2019-11-19 DIAGNOSIS — I129 Hypertensive chronic kidney disease with stage 1 through stage 4 chronic kidney disease, or unspecified chronic kidney disease: Secondary | ICD-10-CM | POA: Diagnosis not present

## 2019-11-19 DIAGNOSIS — I10 Essential (primary) hypertension: Secondary | ICD-10-CM | POA: Diagnosis not present

## 2019-11-19 DIAGNOSIS — F3342 Major depressive disorder, recurrent, in full remission: Secondary | ICD-10-CM | POA: Diagnosis not present

## 2019-12-08 DIAGNOSIS — L820 Inflamed seborrheic keratosis: Secondary | ICD-10-CM | POA: Diagnosis not present

## 2019-12-08 DIAGNOSIS — D1801 Hemangioma of skin and subcutaneous tissue: Secondary | ICD-10-CM | POA: Diagnosis not present

## 2019-12-08 DIAGNOSIS — Z85828 Personal history of other malignant neoplasm of skin: Secondary | ICD-10-CM | POA: Diagnosis not present

## 2019-12-08 DIAGNOSIS — I872 Venous insufficiency (chronic) (peripheral): Secondary | ICD-10-CM | POA: Diagnosis not present

## 2019-12-08 DIAGNOSIS — L821 Other seborrheic keratosis: Secondary | ICD-10-CM | POA: Diagnosis not present

## 2019-12-08 DIAGNOSIS — L812 Freckles: Secondary | ICD-10-CM | POA: Diagnosis not present

## 2019-12-08 DIAGNOSIS — D225 Melanocytic nevi of trunk: Secondary | ICD-10-CM | POA: Diagnosis not present

## 2019-12-08 DIAGNOSIS — I8311 Varicose veins of right lower extremity with inflammation: Secondary | ICD-10-CM | POA: Diagnosis not present

## 2019-12-08 DIAGNOSIS — I8312 Varicose veins of left lower extremity with inflammation: Secondary | ICD-10-CM | POA: Diagnosis not present

## 2019-12-08 DIAGNOSIS — L570 Actinic keratosis: Secondary | ICD-10-CM | POA: Diagnosis not present

## 2019-12-31 DIAGNOSIS — G479 Sleep disorder, unspecified: Secondary | ICD-10-CM | POA: Diagnosis not present

## 2019-12-31 DIAGNOSIS — M810 Age-related osteoporosis without current pathological fracture: Secondary | ICD-10-CM | POA: Diagnosis not present

## 2019-12-31 DIAGNOSIS — I129 Hypertensive chronic kidney disease with stage 1 through stage 4 chronic kidney disease, or unspecified chronic kidney disease: Secondary | ICD-10-CM | POA: Diagnosis not present

## 2019-12-31 DIAGNOSIS — J309 Allergic rhinitis, unspecified: Secondary | ICD-10-CM | POA: Diagnosis not present

## 2019-12-31 DIAGNOSIS — Z136 Encounter for screening for cardiovascular disorders: Secondary | ICD-10-CM | POA: Diagnosis not present

## 2019-12-31 DIAGNOSIS — N183 Chronic kidney disease, stage 3 unspecified: Secondary | ICD-10-CM | POA: Diagnosis not present

## 2019-12-31 DIAGNOSIS — K30 Functional dyspepsia: Secondary | ICD-10-CM | POA: Diagnosis not present

## 2019-12-31 DIAGNOSIS — F3342 Major depressive disorder, recurrent, in full remission: Secondary | ICD-10-CM | POA: Diagnosis not present

## 2020-01-06 DIAGNOSIS — M810 Age-related osteoporosis without current pathological fracture: Secondary | ICD-10-CM | POA: Diagnosis not present

## 2020-01-06 DIAGNOSIS — Z853 Personal history of malignant neoplasm of breast: Secondary | ICD-10-CM | POA: Diagnosis not present

## 2020-01-06 DIAGNOSIS — I129 Hypertensive chronic kidney disease with stage 1 through stage 4 chronic kidney disease, or unspecified chronic kidney disease: Secondary | ICD-10-CM | POA: Diagnosis not present

## 2020-01-06 DIAGNOSIS — Z0000 Encounter for general adult medical examination without abnormal findings: Secondary | ICD-10-CM | POA: Diagnosis not present

## 2020-01-06 DIAGNOSIS — J309 Allergic rhinitis, unspecified: Secondary | ICD-10-CM | POA: Diagnosis not present

## 2020-01-06 DIAGNOSIS — G479 Sleep disorder, unspecified: Secondary | ICD-10-CM | POA: Diagnosis not present

## 2020-01-06 DIAGNOSIS — I10 Essential (primary) hypertension: Secondary | ICD-10-CM | POA: Diagnosis not present

## 2020-01-06 DIAGNOSIS — F3342 Major depressive disorder, recurrent, in full remission: Secondary | ICD-10-CM | POA: Diagnosis not present

## 2020-01-25 DIAGNOSIS — I129 Hypertensive chronic kidney disease with stage 1 through stage 4 chronic kidney disease, or unspecified chronic kidney disease: Secondary | ICD-10-CM | POA: Diagnosis not present

## 2020-01-25 DIAGNOSIS — Z853 Personal history of malignant neoplasm of breast: Secondary | ICD-10-CM | POA: Diagnosis not present

## 2020-01-25 DIAGNOSIS — F3342 Major depressive disorder, recurrent, in full remission: Secondary | ICD-10-CM | POA: Diagnosis not present

## 2020-01-25 DIAGNOSIS — I10 Essential (primary) hypertension: Secondary | ICD-10-CM | POA: Diagnosis not present

## 2020-01-25 DIAGNOSIS — N183 Chronic kidney disease, stage 3 unspecified: Secondary | ICD-10-CM | POA: Diagnosis not present

## 2020-01-25 DIAGNOSIS — M810 Age-related osteoporosis without current pathological fracture: Secondary | ICD-10-CM | POA: Diagnosis not present

## 2020-03-09 DIAGNOSIS — M170 Bilateral primary osteoarthritis of knee: Secondary | ICD-10-CM | POA: Diagnosis not present

## 2020-03-16 DIAGNOSIS — M170 Bilateral primary osteoarthritis of knee: Secondary | ICD-10-CM | POA: Diagnosis not present

## 2020-03-16 DIAGNOSIS — M179 Osteoarthritis of knee, unspecified: Secondary | ICD-10-CM | POA: Diagnosis not present

## 2020-03-23 DIAGNOSIS — M170 Bilateral primary osteoarthritis of knee: Secondary | ICD-10-CM | POA: Diagnosis not present

## 2020-03-28 DIAGNOSIS — H52223 Regular astigmatism, bilateral: Secondary | ICD-10-CM | POA: Diagnosis not present

## 2020-03-28 DIAGNOSIS — H524 Presbyopia: Secondary | ICD-10-CM | POA: Diagnosis not present

## 2020-03-28 DIAGNOSIS — H5203 Hypermetropia, bilateral: Secondary | ICD-10-CM | POA: Diagnosis not present

## 2020-03-28 DIAGNOSIS — H40013 Open angle with borderline findings, low risk, bilateral: Secondary | ICD-10-CM | POA: Diagnosis not present

## 2020-05-02 DIAGNOSIS — I129 Hypertensive chronic kidney disease with stage 1 through stage 4 chronic kidney disease, or unspecified chronic kidney disease: Secondary | ICD-10-CM | POA: Diagnosis not present

## 2020-05-02 DIAGNOSIS — F3342 Major depressive disorder, recurrent, in full remission: Secondary | ICD-10-CM | POA: Diagnosis not present

## 2020-05-02 DIAGNOSIS — N183 Chronic kidney disease, stage 3 unspecified: Secondary | ICD-10-CM | POA: Diagnosis not present

## 2020-05-02 DIAGNOSIS — M810 Age-related osteoporosis without current pathological fracture: Secondary | ICD-10-CM | POA: Diagnosis not present

## 2020-05-02 DIAGNOSIS — I10 Essential (primary) hypertension: Secondary | ICD-10-CM | POA: Diagnosis not present

## 2020-05-02 DIAGNOSIS — Z853 Personal history of malignant neoplasm of breast: Secondary | ICD-10-CM | POA: Diagnosis not present

## 2020-05-23 ENCOUNTER — Other Ambulatory Visit: Payer: Self-pay | Admitting: Family Medicine

## 2020-05-23 DIAGNOSIS — Z1231 Encounter for screening mammogram for malignant neoplasm of breast: Secondary | ICD-10-CM

## 2020-07-03 DIAGNOSIS — G5762 Lesion of plantar nerve, left lower limb: Secondary | ICD-10-CM | POA: Diagnosis not present

## 2020-07-03 DIAGNOSIS — M79672 Pain in left foot: Secondary | ICD-10-CM | POA: Diagnosis not present

## 2020-07-03 DIAGNOSIS — M7742 Metatarsalgia, left foot: Secondary | ICD-10-CM | POA: Diagnosis not present

## 2020-07-06 ENCOUNTER — Ambulatory Visit
Admission: RE | Admit: 2020-07-06 | Discharge: 2020-07-06 | Disposition: A | Discharge status: Home / Self Care | Payer: Medicare Other | Source: Ambulatory Visit | Attending: Family Medicine | Admitting: Family Medicine

## 2020-07-06 ENCOUNTER — Other Ambulatory Visit: Payer: Self-pay

## 2020-07-06 DIAGNOSIS — Z1231 Encounter for screening mammogram for malignant neoplasm of breast: Secondary | ICD-10-CM | POA: Diagnosis not present

## 2020-07-14 DIAGNOSIS — K30 Functional dyspepsia: Secondary | ICD-10-CM | POA: Diagnosis not present

## 2020-07-14 DIAGNOSIS — F3342 Major depressive disorder, recurrent, in full remission: Secondary | ICD-10-CM | POA: Diagnosis not present

## 2020-07-14 DIAGNOSIS — I10 Essential (primary) hypertension: Secondary | ICD-10-CM | POA: Diagnosis not present

## 2020-07-14 DIAGNOSIS — G479 Sleep disorder, unspecified: Secondary | ICD-10-CM | POA: Diagnosis not present

## 2020-07-14 DIAGNOSIS — I129 Hypertensive chronic kidney disease with stage 1 through stage 4 chronic kidney disease, or unspecified chronic kidney disease: Secondary | ICD-10-CM | POA: Diagnosis not present

## 2020-07-14 DIAGNOSIS — N183 Chronic kidney disease, stage 3 unspecified: Secondary | ICD-10-CM | POA: Diagnosis not present

## 2020-07-14 DIAGNOSIS — M810 Age-related osteoporosis without current pathological fracture: Secondary | ICD-10-CM | POA: Diagnosis not present

## 2020-07-14 DIAGNOSIS — L719 Rosacea, unspecified: Secondary | ICD-10-CM | POA: Diagnosis not present

## 2020-07-14 DIAGNOSIS — Z853 Personal history of malignant neoplasm of breast: Secondary | ICD-10-CM | POA: Diagnosis not present

## 2020-07-14 DIAGNOSIS — J309 Allergic rhinitis, unspecified: Secondary | ICD-10-CM | POA: Diagnosis not present

## 2020-07-14 DIAGNOSIS — Z23 Encounter for immunization: Secondary | ICD-10-CM | POA: Diagnosis not present

## 2020-07-15 DIAGNOSIS — Z23 Encounter for immunization: Secondary | ICD-10-CM | POA: Diagnosis not present

## 2020-08-02 DIAGNOSIS — Z23 Encounter for immunization: Secondary | ICD-10-CM | POA: Diagnosis not present

## 2020-08-03 DIAGNOSIS — G5762 Lesion of plantar nerve, left lower limb: Secondary | ICD-10-CM | POA: Diagnosis not present

## 2020-08-03 DIAGNOSIS — M7742 Metatarsalgia, left foot: Secondary | ICD-10-CM | POA: Diagnosis not present

## 2020-08-03 DIAGNOSIS — M79672 Pain in left foot: Secondary | ICD-10-CM | POA: Diagnosis not present

## 2020-08-09 DIAGNOSIS — Z853 Personal history of malignant neoplasm of breast: Secondary | ICD-10-CM | POA: Diagnosis not present

## 2020-08-09 DIAGNOSIS — I129 Hypertensive chronic kidney disease with stage 1 through stage 4 chronic kidney disease, or unspecified chronic kidney disease: Secondary | ICD-10-CM | POA: Diagnosis not present

## 2020-08-09 DIAGNOSIS — M810 Age-related osteoporosis without current pathological fracture: Secondary | ICD-10-CM | POA: Diagnosis not present

## 2020-08-09 DIAGNOSIS — F3342 Major depressive disorder, recurrent, in full remission: Secondary | ICD-10-CM | POA: Diagnosis not present

## 2020-08-09 DIAGNOSIS — I10 Essential (primary) hypertension: Secondary | ICD-10-CM | POA: Diagnosis not present

## 2020-08-09 DIAGNOSIS — N183 Chronic kidney disease, stage 3 unspecified: Secondary | ICD-10-CM | POA: Diagnosis not present

## 2020-08-20 DIAGNOSIS — Z20822 Contact with and (suspected) exposure to covid-19: Secondary | ICD-10-CM | POA: Diagnosis not present

## 2020-08-21 DIAGNOSIS — D1801 Hemangioma of skin and subcutaneous tissue: Secondary | ICD-10-CM | POA: Diagnosis not present

## 2020-08-21 DIAGNOSIS — Z85828 Personal history of other malignant neoplasm of skin: Secondary | ICD-10-CM | POA: Diagnosis not present

## 2020-08-21 DIAGNOSIS — I872 Venous insufficiency (chronic) (peripheral): Secondary | ICD-10-CM | POA: Diagnosis not present

## 2020-08-21 DIAGNOSIS — I8311 Varicose veins of right lower extremity with inflammation: Secondary | ICD-10-CM | POA: Diagnosis not present

## 2020-08-21 DIAGNOSIS — I8312 Varicose veins of left lower extremity with inflammation: Secondary | ICD-10-CM | POA: Diagnosis not present

## 2020-08-21 DIAGNOSIS — D2261 Melanocytic nevi of right upper limb, including shoulder: Secondary | ICD-10-CM | POA: Diagnosis not present

## 2020-08-21 DIAGNOSIS — M79672 Pain in left foot: Secondary | ICD-10-CM | POA: Diagnosis not present

## 2020-08-21 DIAGNOSIS — L821 Other seborrheic keratosis: Secondary | ICD-10-CM | POA: Diagnosis not present

## 2020-08-21 DIAGNOSIS — L820 Inflamed seborrheic keratosis: Secondary | ICD-10-CM | POA: Diagnosis not present

## 2020-08-21 DIAGNOSIS — D225 Melanocytic nevi of trunk: Secondary | ICD-10-CM | POA: Diagnosis not present

## 2020-08-30 DIAGNOSIS — G5762 Lesion of plantar nerve, left lower limb: Secondary | ICD-10-CM | POA: Diagnosis not present

## 2020-09-25 DIAGNOSIS — F3342 Major depressive disorder, recurrent, in full remission: Secondary | ICD-10-CM | POA: Diagnosis not present

## 2020-09-25 DIAGNOSIS — I129 Hypertensive chronic kidney disease with stage 1 through stage 4 chronic kidney disease, or unspecified chronic kidney disease: Secondary | ICD-10-CM | POA: Diagnosis not present

## 2020-09-25 DIAGNOSIS — I10 Essential (primary) hypertension: Secondary | ICD-10-CM | POA: Diagnosis not present

## 2020-09-25 DIAGNOSIS — M810 Age-related osteoporosis without current pathological fracture: Secondary | ICD-10-CM | POA: Diagnosis not present

## 2020-09-25 DIAGNOSIS — Z853 Personal history of malignant neoplasm of breast: Secondary | ICD-10-CM | POA: Diagnosis not present

## 2020-09-25 DIAGNOSIS — N183 Chronic kidney disease, stage 3 unspecified: Secondary | ICD-10-CM | POA: Diagnosis not present

## 2020-10-31 DIAGNOSIS — G5762 Lesion of plantar nerve, left lower limb: Secondary | ICD-10-CM | POA: Diagnosis not present

## 2020-10-31 DIAGNOSIS — G8918 Other acute postprocedural pain: Secondary | ICD-10-CM | POA: Diagnosis not present

## 2020-12-06 DIAGNOSIS — I10 Essential (primary) hypertension: Secondary | ICD-10-CM | POA: Diagnosis not present

## 2020-12-06 DIAGNOSIS — M810 Age-related osteoporosis without current pathological fracture: Secondary | ICD-10-CM | POA: Diagnosis not present

## 2020-12-06 DIAGNOSIS — Z853 Personal history of malignant neoplasm of breast: Secondary | ICD-10-CM | POA: Diagnosis not present

## 2020-12-06 DIAGNOSIS — I129 Hypertensive chronic kidney disease with stage 1 through stage 4 chronic kidney disease, or unspecified chronic kidney disease: Secondary | ICD-10-CM | POA: Diagnosis not present

## 2020-12-06 DIAGNOSIS — F3342 Major depressive disorder, recurrent, in full remission: Secondary | ICD-10-CM | POA: Diagnosis not present

## 2021-01-18 DIAGNOSIS — M1711 Unilateral primary osteoarthritis, right knee: Secondary | ICD-10-CM | POA: Diagnosis not present

## 2021-01-18 DIAGNOSIS — M1712 Unilateral primary osteoarthritis, left knee: Secondary | ICD-10-CM | POA: Diagnosis not present

## 2021-01-18 DIAGNOSIS — M170 Bilateral primary osteoarthritis of knee: Secondary | ICD-10-CM | POA: Diagnosis not present

## 2021-01-23 DIAGNOSIS — H5203 Hypermetropia, bilateral: Secondary | ICD-10-CM | POA: Diagnosis not present

## 2021-01-23 DIAGNOSIS — H401121 Primary open-angle glaucoma, left eye, mild stage: Secondary | ICD-10-CM | POA: Diagnosis not present

## 2021-01-23 DIAGNOSIS — H52223 Regular astigmatism, bilateral: Secondary | ICD-10-CM | POA: Diagnosis not present

## 2021-01-23 DIAGNOSIS — H524 Presbyopia: Secondary | ICD-10-CM | POA: Diagnosis not present

## 2021-01-25 DIAGNOSIS — N183 Chronic kidney disease, stage 3 unspecified: Secondary | ICD-10-CM | POA: Diagnosis not present

## 2021-01-25 DIAGNOSIS — M810 Age-related osteoporosis without current pathological fracture: Secondary | ICD-10-CM | POA: Diagnosis not present

## 2021-01-25 DIAGNOSIS — I10 Essential (primary) hypertension: Secondary | ICD-10-CM | POA: Diagnosis not present

## 2021-01-25 DIAGNOSIS — Z853 Personal history of malignant neoplasm of breast: Secondary | ICD-10-CM | POA: Diagnosis not present

## 2021-01-25 DIAGNOSIS — I129 Hypertensive chronic kidney disease with stage 1 through stage 4 chronic kidney disease, or unspecified chronic kidney disease: Secondary | ICD-10-CM | POA: Diagnosis not present

## 2021-01-25 DIAGNOSIS — M170 Bilateral primary osteoarthritis of knee: Secondary | ICD-10-CM | POA: Diagnosis not present

## 2021-01-25 DIAGNOSIS — F3342 Major depressive disorder, recurrent, in full remission: Secondary | ICD-10-CM | POA: Diagnosis not present

## 2021-02-01 DIAGNOSIS — M170 Bilateral primary osteoarthritis of knee: Secondary | ICD-10-CM | POA: Diagnosis not present

## 2021-03-21 DIAGNOSIS — M810 Age-related osteoporosis without current pathological fracture: Secondary | ICD-10-CM | POA: Diagnosis not present

## 2021-03-21 DIAGNOSIS — N183 Chronic kidney disease, stage 3 unspecified: Secondary | ICD-10-CM | POA: Diagnosis not present

## 2021-03-21 DIAGNOSIS — F3342 Major depressive disorder, recurrent, in full remission: Secondary | ICD-10-CM | POA: Diagnosis not present

## 2021-03-21 DIAGNOSIS — I10 Essential (primary) hypertension: Secondary | ICD-10-CM | POA: Diagnosis not present

## 2021-03-21 DIAGNOSIS — I129 Hypertensive chronic kidney disease with stage 1 through stage 4 chronic kidney disease, or unspecified chronic kidney disease: Secondary | ICD-10-CM | POA: Diagnosis not present

## 2021-04-03 DIAGNOSIS — M25562 Pain in left knee: Secondary | ICD-10-CM | POA: Diagnosis not present

## 2021-04-03 DIAGNOSIS — R600 Localized edema: Secondary | ICD-10-CM | POA: Diagnosis not present

## 2021-04-03 DIAGNOSIS — R531 Weakness: Secondary | ICD-10-CM | POA: Diagnosis not present

## 2021-04-03 DIAGNOSIS — R262 Difficulty in walking, not elsewhere classified: Secondary | ICD-10-CM | POA: Diagnosis not present

## 2021-04-05 DIAGNOSIS — R531 Weakness: Secondary | ICD-10-CM | POA: Diagnosis not present

## 2021-04-05 DIAGNOSIS — R262 Difficulty in walking, not elsewhere classified: Secondary | ICD-10-CM | POA: Diagnosis not present

## 2021-04-05 DIAGNOSIS — M25562 Pain in left knee: Secondary | ICD-10-CM | POA: Diagnosis not present

## 2021-04-05 DIAGNOSIS — R600 Localized edema: Secondary | ICD-10-CM | POA: Diagnosis not present

## 2021-04-06 DIAGNOSIS — Z1389 Encounter for screening for other disorder: Secondary | ICD-10-CM | POA: Diagnosis not present

## 2021-04-06 DIAGNOSIS — Z23 Encounter for immunization: Secondary | ICD-10-CM | POA: Diagnosis not present

## 2021-04-06 DIAGNOSIS — Z0000 Encounter for general adult medical examination without abnormal findings: Secondary | ICD-10-CM | POA: Diagnosis not present

## 2021-04-10 DIAGNOSIS — R600 Localized edema: Secondary | ICD-10-CM | POA: Diagnosis not present

## 2021-04-10 DIAGNOSIS — M25562 Pain in left knee: Secondary | ICD-10-CM | POA: Diagnosis not present

## 2021-04-10 DIAGNOSIS — R262 Difficulty in walking, not elsewhere classified: Secondary | ICD-10-CM | POA: Diagnosis not present

## 2021-04-10 DIAGNOSIS — R531 Weakness: Secondary | ICD-10-CM | POA: Diagnosis not present

## 2021-04-11 ENCOUNTER — Other Ambulatory Visit: Payer: Self-pay | Admitting: Family Medicine

## 2021-04-11 DIAGNOSIS — M810 Age-related osteoporosis without current pathological fracture: Secondary | ICD-10-CM

## 2021-04-11 DIAGNOSIS — Z1231 Encounter for screening mammogram for malignant neoplasm of breast: Secondary | ICD-10-CM

## 2021-04-13 DIAGNOSIS — R262 Difficulty in walking, not elsewhere classified: Secondary | ICD-10-CM | POA: Diagnosis not present

## 2021-04-13 DIAGNOSIS — M25562 Pain in left knee: Secondary | ICD-10-CM | POA: Diagnosis not present

## 2021-04-13 DIAGNOSIS — R600 Localized edema: Secondary | ICD-10-CM | POA: Diagnosis not present

## 2021-04-13 DIAGNOSIS — R531 Weakness: Secondary | ICD-10-CM | POA: Diagnosis not present

## 2021-04-16 DIAGNOSIS — R531 Weakness: Secondary | ICD-10-CM | POA: Diagnosis not present

## 2021-04-16 DIAGNOSIS — R600 Localized edema: Secondary | ICD-10-CM | POA: Diagnosis not present

## 2021-04-16 DIAGNOSIS — R262 Difficulty in walking, not elsewhere classified: Secondary | ICD-10-CM | POA: Diagnosis not present

## 2021-04-16 DIAGNOSIS — M25562 Pain in left knee: Secondary | ICD-10-CM | POA: Diagnosis not present

## 2021-04-20 DIAGNOSIS — R531 Weakness: Secondary | ICD-10-CM | POA: Diagnosis not present

## 2021-04-20 DIAGNOSIS — R262 Difficulty in walking, not elsewhere classified: Secondary | ICD-10-CM | POA: Diagnosis not present

## 2021-04-20 DIAGNOSIS — M25562 Pain in left knee: Secondary | ICD-10-CM | POA: Diagnosis not present

## 2021-04-20 DIAGNOSIS — R600 Localized edema: Secondary | ICD-10-CM | POA: Diagnosis not present

## 2021-04-25 DIAGNOSIS — R531 Weakness: Secondary | ICD-10-CM | POA: Diagnosis not present

## 2021-04-25 DIAGNOSIS — R600 Localized edema: Secondary | ICD-10-CM | POA: Diagnosis not present

## 2021-04-25 DIAGNOSIS — M25562 Pain in left knee: Secondary | ICD-10-CM | POA: Diagnosis not present

## 2021-04-25 DIAGNOSIS — R262 Difficulty in walking, not elsewhere classified: Secondary | ICD-10-CM | POA: Diagnosis not present

## 2021-04-27 DIAGNOSIS — F3342 Major depressive disorder, recurrent, in full remission: Secondary | ICD-10-CM | POA: Diagnosis not present

## 2021-04-27 DIAGNOSIS — Z0000 Encounter for general adult medical examination without abnormal findings: Secondary | ICD-10-CM | POA: Diagnosis not present

## 2021-04-27 DIAGNOSIS — R262 Difficulty in walking, not elsewhere classified: Secondary | ICD-10-CM | POA: Diagnosis not present

## 2021-04-27 DIAGNOSIS — I10 Essential (primary) hypertension: Secondary | ICD-10-CM | POA: Diagnosis not present

## 2021-04-27 DIAGNOSIS — R531 Weakness: Secondary | ICD-10-CM | POA: Diagnosis not present

## 2021-04-27 DIAGNOSIS — G479 Sleep disorder, unspecified: Secondary | ICD-10-CM | POA: Diagnosis not present

## 2021-04-27 DIAGNOSIS — I129 Hypertensive chronic kidney disease with stage 1 through stage 4 chronic kidney disease, or unspecified chronic kidney disease: Secondary | ICD-10-CM | POA: Diagnosis not present

## 2021-04-27 DIAGNOSIS — M25562 Pain in left knee: Secondary | ICD-10-CM | POA: Diagnosis not present

## 2021-04-27 DIAGNOSIS — M810 Age-related osteoporosis without current pathological fracture: Secondary | ICD-10-CM | POA: Diagnosis not present

## 2021-04-27 DIAGNOSIS — Z853 Personal history of malignant neoplasm of breast: Secondary | ICD-10-CM | POA: Diagnosis not present

## 2021-04-27 DIAGNOSIS — J309 Allergic rhinitis, unspecified: Secondary | ICD-10-CM | POA: Diagnosis not present

## 2021-04-27 DIAGNOSIS — R600 Localized edema: Secondary | ICD-10-CM | POA: Diagnosis not present

## 2021-04-30 DIAGNOSIS — I10 Essential (primary) hypertension: Secondary | ICD-10-CM | POA: Diagnosis not present

## 2021-04-30 DIAGNOSIS — J309 Allergic rhinitis, unspecified: Secondary | ICD-10-CM | POA: Diagnosis not present

## 2021-04-30 DIAGNOSIS — M810 Age-related osteoporosis without current pathological fracture: Secondary | ICD-10-CM | POA: Diagnosis not present

## 2021-04-30 DIAGNOSIS — K30 Functional dyspepsia: Secondary | ICD-10-CM | POA: Diagnosis not present

## 2021-04-30 DIAGNOSIS — G479 Sleep disorder, unspecified: Secondary | ICD-10-CM | POA: Diagnosis not present

## 2021-04-30 DIAGNOSIS — F3342 Major depressive disorder, recurrent, in full remission: Secondary | ICD-10-CM | POA: Diagnosis not present

## 2021-04-30 DIAGNOSIS — L719 Rosacea, unspecified: Secondary | ICD-10-CM | POA: Diagnosis not present

## 2021-05-01 DIAGNOSIS — R600 Localized edema: Secondary | ICD-10-CM | POA: Diagnosis not present

## 2021-05-01 DIAGNOSIS — R262 Difficulty in walking, not elsewhere classified: Secondary | ICD-10-CM | POA: Diagnosis not present

## 2021-05-01 DIAGNOSIS — R531 Weakness: Secondary | ICD-10-CM | POA: Diagnosis not present

## 2021-05-01 DIAGNOSIS — M25562 Pain in left knee: Secondary | ICD-10-CM | POA: Diagnosis not present

## 2021-05-04 DIAGNOSIS — R600 Localized edema: Secondary | ICD-10-CM | POA: Diagnosis not present

## 2021-05-04 DIAGNOSIS — R531 Weakness: Secondary | ICD-10-CM | POA: Diagnosis not present

## 2021-05-04 DIAGNOSIS — R262 Difficulty in walking, not elsewhere classified: Secondary | ICD-10-CM | POA: Diagnosis not present

## 2021-05-04 DIAGNOSIS — M25562 Pain in left knee: Secondary | ICD-10-CM | POA: Diagnosis not present

## 2021-05-07 DIAGNOSIS — M25562 Pain in left knee: Secondary | ICD-10-CM | POA: Diagnosis not present

## 2021-05-07 DIAGNOSIS — R600 Localized edema: Secondary | ICD-10-CM | POA: Diagnosis not present

## 2021-05-07 DIAGNOSIS — R531 Weakness: Secondary | ICD-10-CM | POA: Diagnosis not present

## 2021-05-07 DIAGNOSIS — R262 Difficulty in walking, not elsewhere classified: Secondary | ICD-10-CM | POA: Diagnosis not present

## 2021-05-11 DIAGNOSIS — R262 Difficulty in walking, not elsewhere classified: Secondary | ICD-10-CM | POA: Diagnosis not present

## 2021-05-11 DIAGNOSIS — R531 Weakness: Secondary | ICD-10-CM | POA: Diagnosis not present

## 2021-05-11 DIAGNOSIS — R600 Localized edema: Secondary | ICD-10-CM | POA: Diagnosis not present

## 2021-05-11 DIAGNOSIS — M25562 Pain in left knee: Secondary | ICD-10-CM | POA: Diagnosis not present

## 2021-05-24 DIAGNOSIS — H401130 Primary open-angle glaucoma, bilateral, stage unspecified: Secondary | ICD-10-CM | POA: Diagnosis not present

## 2021-05-24 DIAGNOSIS — H524 Presbyopia: Secondary | ICD-10-CM | POA: Diagnosis not present

## 2021-05-24 DIAGNOSIS — H52223 Regular astigmatism, bilateral: Secondary | ICD-10-CM | POA: Diagnosis not present

## 2021-05-24 DIAGNOSIS — H5203 Hypermetropia, bilateral: Secondary | ICD-10-CM | POA: Diagnosis not present

## 2021-05-28 DIAGNOSIS — R600 Localized edema: Secondary | ICD-10-CM | POA: Diagnosis not present

## 2021-05-28 DIAGNOSIS — R531 Weakness: Secondary | ICD-10-CM | POA: Diagnosis not present

## 2021-05-28 DIAGNOSIS — M25562 Pain in left knee: Secondary | ICD-10-CM | POA: Diagnosis not present

## 2021-05-28 DIAGNOSIS — R262 Difficulty in walking, not elsewhere classified: Secondary | ICD-10-CM | POA: Diagnosis not present

## 2021-06-04 DIAGNOSIS — R600 Localized edema: Secondary | ICD-10-CM | POA: Diagnosis not present

## 2021-06-04 DIAGNOSIS — R262 Difficulty in walking, not elsewhere classified: Secondary | ICD-10-CM | POA: Diagnosis not present

## 2021-06-04 DIAGNOSIS — M25562 Pain in left knee: Secondary | ICD-10-CM | POA: Diagnosis not present

## 2021-06-04 DIAGNOSIS — R531 Weakness: Secondary | ICD-10-CM | POA: Diagnosis not present

## 2021-06-08 DIAGNOSIS — M170 Bilateral primary osteoarthritis of knee: Secondary | ICD-10-CM | POA: Diagnosis not present

## 2021-06-08 DIAGNOSIS — M1712 Unilateral primary osteoarthritis, left knee: Secondary | ICD-10-CM | POA: Diagnosis not present

## 2021-06-13 DIAGNOSIS — M25562 Pain in left knee: Secondary | ICD-10-CM | POA: Diagnosis not present

## 2021-06-13 DIAGNOSIS — R531 Weakness: Secondary | ICD-10-CM | POA: Diagnosis not present

## 2021-06-13 DIAGNOSIS — R262 Difficulty in walking, not elsewhere classified: Secondary | ICD-10-CM | POA: Diagnosis not present

## 2021-06-13 DIAGNOSIS — R600 Localized edema: Secondary | ICD-10-CM | POA: Diagnosis not present

## 2021-06-26 DIAGNOSIS — M25662 Stiffness of left knee, not elsewhere classified: Secondary | ICD-10-CM | POA: Diagnosis not present

## 2021-06-26 DIAGNOSIS — M1712 Unilateral primary osteoarthritis, left knee: Secondary | ICD-10-CM | POA: Diagnosis not present

## 2021-06-26 DIAGNOSIS — M25562 Pain in left knee: Secondary | ICD-10-CM | POA: Diagnosis not present

## 2021-06-28 ENCOUNTER — Ambulatory Visit
Admission: RE | Admit: 2021-06-28 | Discharge: 2021-06-28 | Disposition: A | Discharge status: Home / Self Care | Payer: Medicare Other | Source: Ambulatory Visit | Attending: Family Medicine | Admitting: Family Medicine

## 2021-06-28 ENCOUNTER — Other Ambulatory Visit: Payer: Self-pay

## 2021-06-28 DIAGNOSIS — Z1231 Encounter for screening mammogram for malignant neoplasm of breast: Secondary | ICD-10-CM

## 2021-07-03 DIAGNOSIS — Z7901 Long term (current) use of anticoagulants: Secondary | ICD-10-CM | POA: Diagnosis not present

## 2021-07-03 DIAGNOSIS — Z01818 Encounter for other preprocedural examination: Secondary | ICD-10-CM | POA: Diagnosis not present

## 2021-07-06 NOTE — Patient Instructions (Addendum)
DUE TO COVID-19 ONLY ONE VISITOR IS ALLOWED TO COME WITH YOU AND STAY IN THE WAITING ROOM ONLY DURING PRE OP AND PROCEDURE.   **NO VISITORS ARE ALLOWED IN THE SHORT STAY AREA OR RECOVERY ROOM!!**  IF YOU WILL BE ADMITTED INTO THE HOSPITAL YOU ARE ALLOWED ONLY TWO SUPPORT PEOPLE DURING VISITATION HOURS ONLY (10AM -8PM)   The support person(s) may change daily. The support person(s) must pass our screening, gel in and out, and wear a mask at all times, including in the patient's room. Patients must also wear a mask when staff or their support person are in the room.  No visitors under the age of 33. Any visitor under the age of 57 must be accompanied by an adult.   COVID SWAB TESTING MUST BE COMPLETED ON:  Thursday, 07-19-21 Between the hours of 8 and 3  **MUST PRESENT COMPLETED FORM AT TESTING SITE**    [place] (backside of the building)  You are not required to quarantine, however you are required to wear a well-fitted mask when you are out and around people not in your household.  Hand Hygiene often Do NOT share personal items Notify your provider if you are in close contact with someone who has COVID or you develop fever 100.4 or greater, new onset of sneezing, cough, sore throat, shortness of breath or body aches.    Your procedure is scheduled on:  Monday, 07-23-21   Report to [facility]  Entrance    Report to admitting at  10:00 AM   Call this number if you have problems the morning of surgery [phone]   Do not eat food :After Midnight.   May have liquids until 9:45 AM day of surgery  CLEAR LIQUID DIET  Foods Allowed                                                                     Foods Excluded  Water, Black Coffee (no milk/no creamer) and tea, regular and decaf                              liquids that you cannot  Plain Jell-O in any flavor  (No red)                         see through such as: Fruit ices (not with fruit  pulp)                                 milk, soups, orange juice  Iced Popsicles (No red)                                    All solid food                             Apple juices Sports drinks like Gatorade (No red) Lightly seasoned clear broth or consume(fat free) Sugar     Complete one Ensure drink the  morning of surgery at 9:45 AM the day of surgery.       The day of surgery:  Drink ONE (1) Pre-Surgery Clear Ensure the morning of surgery. Drink in one sitting. Do not sip.  This drink was given to you during your hospital  pre-op appointment visit. Nothing else to drink after completing the Pre-Surgery Clear Ensure.          If you have questions, please contact your surgeon's office.     Oral Hygiene is also important to reduce your risk of infection.                                    Remember - BRUSH YOUR TEETH THE MORNING OF SURGERY WITH YOUR REGULAR TOOTHPASTE   Do NOT smoke after Midnight   Take these medicines the morning of surgery with A SIP OF WATER:  Amlodipine, Citalopram, Flonase nasal spray   Stop Aspirin one week prior to surgery   Stop all vitamins and herbal supplements a week before surgery   You may not have any metal on your body including hair pins, jewelry, and body piercing             Do not wear make-up, lotions, powders, perfumes or deodorant  Do not wear nail polish including gel and S&S, artificial/acrylic nails, or any other type of covering on natural nails including finger and toenails. If you have artificial nails, gel coating, etc. that needs to be removed by a nail salon please have this removed prior to surgery or surgery may need to be canceled/ delayed if the surgeon/ anesthesia feels like they are unable to be safely monitored.   Do not shave  48 hours prior to surgery.   Do not bring valuables to the hospital. [place].   Contacts, dentures or bridgework may not be worn into surgery.   Bring  small overnight bag day of surgery.   Please read over the following fact sheets you were given: IF YOU HAVE QUESTIONS ABOUT YOUR PRE OP INSTRUCTIONS PLEASE CALL [facility] - Preparing for Surgery Before surgery, you can play an important role.  Because skin is not sterile, your skin needs to be as free of germs as possible.  You can reduce the number of germs on your skin by washing with CHG (chlorahexidine gluconate) soap before surgery.  CHG is an antiseptic cleaner which kills germs and bonds with the skin to continue killing germs even after washing. Please DO NOT use if you have an allergy to CHG or antibacterial soaps.  If your skin becomes reddened/irritated stop using the CHG and inform your nurse when you arrive at Short Stay. Do not shave (including legs and underarms) for at least 48 hours prior to the first CHG shower.  You may shave your face/neck.  Please follow these instructions carefully:  1.  Shower with CHG Soap the night before surgery and the  morning of surgery.  2.  If you choose to wash your hair, wash your hair first as usual with your normal  shampoo.  3.  After you shampoo, rinse your hair and body thoroughly to remove the shampoo.                             4.  Use CHG as  you would any other liquid soap.  You can apply chg directly to the skin and wash.  Gently with a scrungie or clean washcloth.  5.  Apply the CHG Soap to your body ONLY FROM THE NECK DOWN.   Do   not use on face/ open                           Wound or open sores. Avoid contact with eyes, ears mouth and   genitals (private parts).                       Wash face,  Genitals (private parts) with your normal soap.             6.  Wash thoroughly, paying special attention to the area where your    surgery  will be performed.  7.  Thoroughly rinse your body with warm water from the neck down.  8.  DO NOT shower/wash with your normal soap after using and rinsing off the CHG Soap.                 9.  Pat yourself dry with a clean towel.            10.  Wear clean pajamas.            11.  Place clean sheets on your bed the night of your first shower and do not  sleep with pets. Day of Surgery : Do not apply any lotions/deodorants the morning of surgery.  Please wear clean clothes to the hospital/surgery center.  FAILURE TO FOLLOW THESE INSTRUCTIONS MAY RESULT IN THE CANCELLATION OF YOUR SURGERY  PATIENT SIGNATURE_________________________________  NURSE SIGNATURE__________________________________  ________________________________________________________________________   Adam Phenix  An incentive spirometer is a tool that can help keep your lungs clear and active. This tool measures how well you are filling your lungs with each breath. Taking long deep breaths may help reverse or decrease the chance of developing breathing (pulmonary) problems (especially infection) following: A long period of time when you are unable to move or be active. BEFORE THE PROCEDURE  If the spirometer includes an indicator to show your best effort, your nurse or respiratory therapist will set it to a desired goal. If possible, sit up straight or lean slightly forward. Try not to slouch. Hold the incentive spirometer in an upright position. INSTRUCTIONS FOR USE  Sit on the edge of your bed if possible, or sit up as far as you can in bed or on a chair. Hold the incentive spirometer in an upright position. Breathe out normally. Place the mouthpiece in your mouth and seal your lips tightly around it. Breathe in slowly and as deeply as possible, raising the piston or the ball toward the top of the column. Hold your breath for 3-5 seconds or for as long as possible. Allow the piston or ball to fall to the bottom of the column. Remove the mouthpiece from your mouth and breathe out normally. Rest for a few seconds and repeat Steps 1 through 7 at least 10 times every 1-2 hours when you are  awake. Take your time and take a few normal breaths between deep breaths. The spirometer may include an indicator to show your best effort. Use the indicator as a goal to work toward during each repetition. After each set of 10 deep breaths, practice coughing to be sure your lungs are clear. If  you have an incision (the cut made at the time of surgery), support your incision when coughing by placing a pillow or rolled up towels firmly against it. Once you are able to get out of bed, walk around indoors and cough well. You may stop using the incentive spirometer when instructed by your caregiver.  RISKS AND COMPLICATIONS Take your time so you do not get dizzy or light-headed. If you are in pain, you may need to take or ask for pain medication before doing incentive spirometry. It is harder to take a deep breath if you are having pain. AFTER USE Rest and breathe slowly and easily. It can be helpful to keep track of a log of your progress. Your caregiver can provide you with a simple table to help with this. If you are using the spirometer at home, follow these instructions: [place] IF:  You are having difficultly using the spirometer. You have trouble using the spirometer as often as instructed. Your pain medication is not giving enough relief while using the spirometer. You develop fever of 100.5 F (38.1 C) or higher. SEEK IMMEDIATE MEDICAL CARE IF:  You cough up bloody sputum that had not been present before. You develop fever of 102 F (38.9 C) or greater. You develop worsening pain at or near the incision site. MAKE SURE YOU:  Understand these instructions. Will watch your condition. Will get help right away if you are not doing well or get worse. Document Released: 02/17/2007 Document Revised: 12/30/2011 Document Reviewed: 04/20/2007 ExitCare Patient Information 2014 Exit[employer], [place].   ________________________________________________________________________  WHAT IS A  BLOOD TRANSFUSION? Blood Transfusion Information  A transfusion is the replacement of blood or some of its parts. Blood is made up of multiple cells which provide different functions. Red blood cells carry oxygen and are used for blood loss replacement. [place] blood cells fight against infection. Platelets control bleeding. Plasma helps clot blood. Other blood products are available for specialized needs, such as hemophilia or other clotting disorders. BEFORE THE TRANSFUSION  Who gives blood for transfusions?  Healthy volunteers who are fully evaluated to make sure their blood is safe. This is blood bank blood. Transfusion therapy is the safest it has ever been in the practice of medicine. Before blood is taken from a donor, a complete history is taken to make sure that person has no history of diseases nor engages in risky social behavior (examples are intravenous drug use or sexual activity with multiple partners). The donor's travel history is screened to minimize risk of transmitting infections, such as malaria. The donated blood is tested for signs of infectious diseases, such as HIV and hepatitis. The blood is then tested to be sure it is compatible with you in order to minimize the chance of a transfusion reaction. If you or a relative donates blood, this is often done in anticipation of surgery and is not appropriate for emergency situations. It takes many days to process the donated blood. RISKS AND COMPLICATIONS Although transfusion therapy is very safe and saves many lives, the main dangers of transfusion include:  Getting an infectious disease. Developing a transfusion reaction. This is an allergic reaction to something in the blood you were given. Every precaution is taken to prevent this. The decision to have a blood transfusion has been considered carefully by your caregiver before blood is given. Blood is not given unless the benefits outweigh the risks. AFTER THE TRANSFUSION Right  after receiving a blood transfusion, you will usually feel  much better and more energetic. This is especially true if your red blood cells have gotten low (anemic). The transfusion raises the level of the red blood cells which carry oxygen, and this usually causes an energy increase. The nurse administering the transfusion will monitor you carefully for complications. HOME CARE INSTRUCTIONS  No special instructions are needed after a transfusion. You may find your energy is better. Speak with your caregiver about any limitations on activity for underlying diseases you may have. SEEK MEDICAL CARE IF:  Your condition is not improving after your transfusion. You develop redness or irritation at the intravenous (IV) site. SEEK IMMEDIATE MEDICAL CARE IF:  Any of the following symptoms occur over the next 12 hours: Shaking chills. You have a temperature by mouth above 102 F (38.9 C), not controlled by medicine. Chest, back, or muscle pain. People around you feel you are not acting correctly or are confused. Shortness of breath or difficulty breathing. Dizziness and fainting. You get a rash or develop hives. You have a decrease in urine output. Your urine turns a dark color or changes to pink, red, or brown. Any of the following symptoms occur over the next 10 days: You have a temperature by mouth above 102 F (38.9 C), not controlled by medicine. Shortness of breath. Weakness after normal activity. The [place] part of the eye turns yellow (jaundice). You have a decrease in the amount of urine or are urinating less often. Your urine turns a dark color or changes to pink, red, or brown. Document Released: 10/04/2000 Document Revised: 12/30/2011 Document Reviewed: 05/23/2008 [facility] Patient Information 2014 [place], [place].  _______________________________________________________________________

## 2021-07-06 NOTE — Progress Notes (Addendum)
COVID swab appointment: 07-19-21  COVID Vaccine Completed:  Yes x4 Date COVID Vaccine completed: Has received booster:  Yes x2 COVID vaccine manufacturer: Pfizer     Date of COVID positive in last 90 days: No  PCP - Cari Caraway, MD.  Office note on chart Cardiologist - N/A  Medical clearance on chart by Dr. Addison Lank  Chest x-ray - N/A EKG - 07-10-21 Epic Stress Test - N/A ECHO - greater than 2 years.  Epic Cardiac Cath - N/A Pacemaker/ICD device last checked: Spinal Cord Stimulator:  Sleep Study - Yes, neg sleep apnea CPAP - No  Fasting Blood Sugar - N/A Checks Blood Sugar _____ times a day  Blood Thinner Instructions: Aspirin Instructions:  ASA 81 mg.  To stop one week prior to surgery per patient Last Dose:  Activity level:  Can go up a flight of stairs and perform activities of daily living without stopping and without symptoms of chest pain or shortness of breath.    Anesthesia review:  N/A  Patient denies shortness of breath, fever, cough and chest pain at PAT appointment   Patient verbalized understanding of instructions that were given to them at the PAT appointment. Patient was also instructed that they will need to review over the PAT instructions again at home before surgery.

## 2021-07-10 ENCOUNTER — Encounter (HOSPITAL_COMMUNITY): Payer: Self-pay

## 2021-07-10 ENCOUNTER — Encounter (HOSPITAL_COMMUNITY)
Admission: RE | Admit: 2021-07-10 | Discharge: 2021-07-10 | Disposition: A | Discharge status: Home / Self Care | Payer: Medicare Other | Source: Ambulatory Visit | Attending: Orthopedic Surgery | Admitting: Orthopedic Surgery

## 2021-07-10 ENCOUNTER — Other Ambulatory Visit: Payer: Self-pay

## 2021-07-10 DIAGNOSIS — Z01818 Encounter for other preprocedural examination: Secondary | ICD-10-CM | POA: Insufficient documentation

## 2021-07-10 LAB — SURGICAL PCR SCREEN
MRSA, PCR: NEGATIVE
Staphylococcus aureus: NEGATIVE

## 2021-07-10 LAB — COMPREHENSIVE METABOLIC PANEL
ALT: 16 U/L (ref 0–44)
AST: 22 U/L (ref 15–41)
Albumin: 3.8 g/dL (ref 3.5–5.0)
Alkaline Phosphatase: 57 U/L (ref 38–126)
Anion gap: 8 (ref 5–15)
BUN: 14 mg/dL (ref 8–23)
CO2: 27 mmol/L (ref 22–32)
Calcium: 9.8 mg/dL (ref 8.9–10.3)
Chloride: 101 mmol/L (ref 98–111)
Creatinine, Ser: 0.65 mg/dL (ref 0.44–1.00)
GFR, Estimated: >60 mL/min (ref >60)
Glucose, Bld: 95 mg/dL (ref 70–99)
Potassium: 4.5 mmol/L (ref 3.5–5.1)
Sodium: 136 mmol/L (ref 135–145)
Total Bilirubin: 0.6 mg/dL (ref 0.3–1.2)
Total Protein: 7.6 g/dL (ref 6.5–8.1)

## 2021-07-16 DIAGNOSIS — L249 Irritant contact dermatitis, unspecified cause: Secondary | ICD-10-CM | POA: Diagnosis not present

## 2021-07-16 DIAGNOSIS — R21 Rash and other nonspecific skin eruption: Secondary | ICD-10-CM | POA: Diagnosis not present

## 2021-07-18 DIAGNOSIS — J309 Allergic rhinitis, unspecified: Secondary | ICD-10-CM | POA: Diagnosis not present

## 2021-07-18 DIAGNOSIS — L502 Urticaria due to cold and heat: Secondary | ICD-10-CM | POA: Diagnosis not present

## 2021-07-18 DIAGNOSIS — L230 Allergic contact dermatitis due to metals: Secondary | ICD-10-CM | POA: Diagnosis not present

## 2021-07-18 DIAGNOSIS — R21 Rash and other nonspecific skin eruption: Secondary | ICD-10-CM | POA: Diagnosis not present

## 2021-07-19 ENCOUNTER — Other Ambulatory Visit: Payer: Self-pay | Admitting: Orthopedic Surgery

## 2021-07-19 LAB — SARS CORONAVIRUS 2 (TAT 6-24 HRS): SARS Coronavirus 2: NEGATIVE

## 2021-07-20 DIAGNOSIS — R21 Rash and other nonspecific skin eruption: Secondary | ICD-10-CM | POA: Diagnosis not present

## 2021-07-20 DIAGNOSIS — L502 Urticaria due to cold and heat: Secondary | ICD-10-CM | POA: Diagnosis not present

## 2021-07-20 DIAGNOSIS — L230 Allergic contact dermatitis due to metals: Secondary | ICD-10-CM | POA: Diagnosis not present

## 2021-07-20 DIAGNOSIS — J309 Allergic rhinitis, unspecified: Secondary | ICD-10-CM | POA: Diagnosis not present

## 2021-07-23 ENCOUNTER — Encounter (HOSPITAL_COMMUNITY): Payer: Self-pay | Admitting: Orthopedic Surgery

## 2021-07-23 ENCOUNTER — Encounter (HOSPITAL_COMMUNITY)
Admission: RE | Disposition: A | Discharge status: Home / Self Care | Payer: Self-pay | Source: Ambulatory Visit | Attending: Orthopedic Surgery

## 2021-07-23 ENCOUNTER — Observation Stay (HOSPITAL_COMMUNITY)
Admission: RE | Admit: 2021-07-23 | Discharge: 2021-07-24 | Disposition: A | Discharge status: Home / Self Care | Payer: Medicare Other | Source: Ambulatory Visit | Attending: Orthopedic Surgery | Admitting: Orthopedic Surgery

## 2021-07-23 ENCOUNTER — Ambulatory Visit (HOSPITAL_COMMUNITY): Payer: Medicare Other | Admitting: Certified Registered Nurse Anesthetist

## 2021-07-23 ENCOUNTER — Other Ambulatory Visit: Payer: Self-pay

## 2021-07-23 DIAGNOSIS — Z7982 Long term (current) use of aspirin: Secondary | ICD-10-CM | POA: Insufficient documentation

## 2021-07-23 DIAGNOSIS — Z96642 Presence of left artificial hip joint: Secondary | ICD-10-CM | POA: Insufficient documentation

## 2021-07-23 DIAGNOSIS — I10 Essential (primary) hypertension: Secondary | ICD-10-CM | POA: Insufficient documentation

## 2021-07-23 DIAGNOSIS — F418 Other specified anxiety disorders: Secondary | ICD-10-CM | POA: Diagnosis not present

## 2021-07-23 DIAGNOSIS — M179 Osteoarthritis of knee, unspecified: Secondary | ICD-10-CM | POA: Diagnosis present

## 2021-07-23 DIAGNOSIS — M1712 Unilateral primary osteoarthritis, left knee: Secondary | ICD-10-CM | POA: Diagnosis not present

## 2021-07-23 DIAGNOSIS — Z853 Personal history of malignant neoplasm of breast: Secondary | ICD-10-CM | POA: Insufficient documentation

## 2021-07-23 DIAGNOSIS — Z79899 Other long term (current) drug therapy: Secondary | ICD-10-CM | POA: Diagnosis not present

## 2021-07-23 DIAGNOSIS — K449 Diaphragmatic hernia without obstruction or gangrene: Secondary | ICD-10-CM | POA: Diagnosis not present

## 2021-07-23 DIAGNOSIS — G8918 Other acute postprocedural pain: Secondary | ICD-10-CM | POA: Diagnosis not present

## 2021-07-23 HISTORY — PX: TOTAL KNEE ARTHROPLASTY: SHX125

## 2021-07-23 LAB — PROTIME-INR
INR: 1 (ref 0.8–1.2)
Prothrombin Time: 12.9 seconds (ref 11.4–15.2)

## 2021-07-23 LAB — CBC
HCT: 34.2 % — ABNORMAL LOW (ref 36.0–46.0)
Hemoglobin: 11.3 g/dL — ABNORMAL LOW (ref 12.0–15.0)
MCH: 32 pg (ref 26.0–34.0)
MCHC: 33 g/dL (ref 30.0–36.0)
MCV: 96.9 fL (ref 80.0–100.0)
Platelets: 197 10*3/uL (ref 150–400)
RBC: 3.53 MIL/uL — ABNORMAL LOW (ref 3.87–5.11)
RDW: 14 % (ref 11.5–15.5)
WBC: 6.8 10*3/uL (ref 4.0–10.5)
nRBC: 0 % (ref 0.0–0.2)

## 2021-07-23 LAB — TYPE AND SCREEN
ABO/RH(D): A POS
Antibody Screen: NEGATIVE

## 2021-07-23 SURGERY — ARTHROPLASTY, KNEE, TOTAL
Anesthesia: Spinal | Site: Knee | Laterality: Left

## 2021-07-23 MED ORDER — GABAPENTIN 300 MG PO CAPS
300.0000 mg | ORAL_CAPSULE | Freq: Three times a day (TID) | ORAL | Status: DC
Start: 2021-07-23 — End: 2021-07-24
  Administered 2021-07-23 – 2021-07-24 (×3): 300 mg via ORAL
  Filled 2021-07-23 (×3): qty 1

## 2021-07-23 MED ORDER — LOSARTAN POTASSIUM 25 MG PO TABS
25.0000 mg | ORAL_TABLET | Freq: Every evening | ORAL | Status: DC
  Administered 2021-07-23: 25 mg via ORAL
  Filled 2021-07-23: qty 1

## 2021-07-23 MED ORDER — METOCLOPRAMIDE HCL 5 MG PO TABS: 5.0000 mg | ORAL_TABLET | Freq: Three times a day (TID) | ORAL | Status: DC | PRN

## 2021-07-23 MED ORDER — CHLORHEXIDINE GLUCONATE 0.12 % MT SOLN
15.0000 mL | Freq: Once | OROMUCOSAL | Status: AC
  Administered 2021-07-23: 15 mL via OROMUCOSAL

## 2021-07-23 MED ORDER — DIPHENHYDRAMINE HCL 12.5 MG/5ML PO ELIX: 12.5000 mg | ORAL_SOLUTION | ORAL | Status: DC | PRN

## 2021-07-23 MED ORDER — BUPIVACAINE IN DEXTROSE 0.75-8.25 % IT SOLN
INTRATHECAL | Status: DC | PRN
  Administered 2021-07-23: 1.6 mL via INTRATHECAL

## 2021-07-23 MED ORDER — ZOLPIDEM TARTRATE 5 MG PO TABS
5.0000 mg | ORAL_TABLET | Freq: Every evening | ORAL | Status: DC | PRN
  Administered 2021-07-23: 5 mg via ORAL
  Filled 2021-07-23: qty 1

## 2021-07-23 MED ORDER — ONDANSETRON HCL 4 MG/2ML IJ SOLN: 4.0000 mg | Freq: Once | INTRAMUSCULAR | Status: DC | PRN

## 2021-07-23 MED ORDER — PHENOL 1.4 % MT LIQD: 1.0000 | OROMUCOSAL | Status: DC | PRN

## 2021-07-23 MED ORDER — TIMOLOL HEMIHYDRATE 0.25 % OP SOLN: 1.0000 [drp] | OPHTHALMIC | Status: DC

## 2021-07-23 MED ORDER — 0.9 % SODIUM CHLORIDE (POUR BTL) OPTIME
TOPICAL | Status: DC | PRN
  Administered 2021-07-23: 1000 mL

## 2021-07-23 MED ORDER — BUPIVACAINE LIPOSOME 1.3 % IJ SUSP
INTRAMUSCULAR | Status: DC | PRN
  Administered 2021-07-23: 20 mL

## 2021-07-23 MED ORDER — METHOCARBAMOL 500 MG IVPB - SIMPLE MED
500.0000 mg | Freq: Four times a day (QID) | INTRAVENOUS | Status: DC | PRN
  Filled 2021-07-23: qty 50

## 2021-07-23 MED ORDER — POVIDONE-IODINE 10 % EX SWAB
2.0000 "application " | Freq: Once | CUTANEOUS | Status: AC
  Administered 2021-07-23: 2 via TOPICAL

## 2021-07-23 MED ORDER — DEXAMETHASONE SODIUM PHOSPHATE 10 MG/ML IJ SOLN
8.0000 mg | Freq: Once | INTRAMUSCULAR | Status: AC
  Administered 2021-07-23: 8 mg via INTRAVENOUS

## 2021-07-23 MED ORDER — FLEET ENEMA 7-19 GM/118ML RE ENEM: 1.0000 | ENEMA | Freq: Once | RECTAL | Status: DC | PRN

## 2021-07-23 MED ORDER — TRANEXAMIC ACID-NACL 1000-0.7 MG/100ML-% IV SOLN
1000.0000 mg | INTRAVENOUS | Status: AC
  Administered 2021-07-23: 1000 mg via INTRAVENOUS
  Filled 2021-07-23: qty 100

## 2021-07-23 MED ORDER — OXYCODONE HCL 5 MG PO TABS
10.0000 mg | ORAL_TABLET | ORAL | Status: DC | PRN
Start: 2021-07-23 — End: 2021-07-24
  Administered 2021-07-23 – 2021-07-24 (×5): 10 mg via ORAL
  Filled 2021-07-23 (×5): qty 2

## 2021-07-23 MED ORDER — POLYETHYLENE GLYCOL 3350 17 G PO PACK: 17.0000 g | PACK | Freq: Every day | ORAL | Status: DC | PRN

## 2021-07-23 MED ORDER — PHENYLEPHRINE 40 MCG/ML (10ML) SYRINGE FOR IV PUSH (FOR BLOOD PRESSURE SUPPORT)
PREFILLED_SYRINGE | INTRAVENOUS | Status: DC | PRN
  Administered 2021-07-23: 80 ug via INTRAVENOUS

## 2021-07-23 MED ORDER — LACTATED RINGERS IV SOLN: INTRAVENOUS | Status: DC

## 2021-07-23 MED ORDER — HYDROMORPHONE HCL 1 MG/ML IJ SOLN: 0.2500 mg | INTRAMUSCULAR | Status: DC | PRN

## 2021-07-23 MED ORDER — SODIUM CHLORIDE 0.9 % IR SOLN
Status: DC | PRN
  Administered 2021-07-23: 1000 mL

## 2021-07-23 MED ORDER — MORPHINE SULFATE (PF) 2 MG/ML IV SOLN
0.5000 mg | INTRAVENOUS | Status: DC | PRN
  Administered 2021-07-23: 1 mg via INTRAVENOUS
  Administered 2021-07-23: 0.5 mg via INTRAVENOUS
  Filled 2021-07-23 (×2): qty 1

## 2021-07-23 MED ORDER — PROPOFOL 500 MG/50ML IV EMUL
INTRAVENOUS | Status: DC | PRN
  Administered 2021-07-23: 100 ug/kg/min via INTRAVENOUS

## 2021-07-23 MED ORDER — DOCUSATE SODIUM 100 MG PO CAPS
100.0000 mg | ORAL_CAPSULE | Freq: Two times a day (BID) | ORAL | Status: DC
  Administered 2021-07-23 – 2021-07-24 (×2): 100 mg via ORAL
  Filled 2021-07-23 (×2): qty 1

## 2021-07-23 MED ORDER — OXYCODONE HCL 5 MG/5ML PO SOLN
5.0000 mg | Freq: Once | ORAL | Status: DC | PRN
Start: 2021-07-23 — End: 2021-07-23

## 2021-07-23 MED ORDER — BUPIVACAINE LIPOSOME 1.3 % IJ SUSP
INTRAMUSCULAR | Status: AC
  Filled 2021-07-23: qty 20

## 2021-07-23 MED ORDER — MENTHOL 3 MG MT LOZG: 1.0000 | LOZENGE | OROMUCOSAL | Status: DC | PRN

## 2021-07-23 MED ORDER — AMLODIPINE BESYLATE 5 MG PO TABS
5.0000 mg | ORAL_TABLET | Freq: Every evening | ORAL | Status: DC
  Administered 2021-07-23: 5 mg via ORAL
  Filled 2021-07-23: qty 1

## 2021-07-23 MED ORDER — FENTANYL CITRATE PF 50 MCG/ML IJ SOSY
50.0000 ug | PREFILLED_SYRINGE | INTRAMUSCULAR | Status: DC
  Administered 2021-07-23: 50 ug via INTRAVENOUS
  Filled 2021-07-23: qty 2

## 2021-07-23 MED ORDER — OXYCODONE HCL 5 MG PO TABS
5.0000 mg | ORAL_TABLET | ORAL | Status: DC | PRN
  Administered 2021-07-23: 5 mg via ORAL
  Filled 2021-07-23: qty 1

## 2021-07-23 MED ORDER — BISACODYL 10 MG RE SUPP: 10.0000 mg | Freq: Every day | RECTAL | Status: DC | PRN

## 2021-07-23 MED ORDER — CEFAZOLIN SODIUM-DEXTROSE 2-4 GM/100ML-% IV SOLN
2.0000 g | Freq: Four times a day (QID) | INTRAVENOUS | Status: AC
  Administered 2021-07-23 (×2): 2 g via INTRAVENOUS
  Filled 2021-07-23 (×2): qty 100

## 2021-07-23 MED ORDER — BUPIVACAINE LIPOSOME 1.3 % IJ SUSP: 20.0000 mL | Freq: Once | INTRAMUSCULAR | Status: DC

## 2021-07-23 MED ORDER — ONDANSETRON HCL 4 MG/2ML IJ SOLN
INTRAMUSCULAR | Status: DC | PRN
  Administered 2021-07-23: 4 mg via INTRAVENOUS

## 2021-07-23 MED ORDER — SODIUM CHLORIDE 0.9 % IV SOLN: INTRAVENOUS | Status: DC

## 2021-07-23 MED ORDER — SODIUM CHLORIDE (PF) 0.9 % IJ SOLN
INTRAMUSCULAR | Status: AC
  Filled 2021-07-23: qty 10

## 2021-07-23 MED ORDER — ACETAMINOPHEN 500 MG PO TABS
1000.0000 mg | ORAL_TABLET | Freq: Four times a day (QID) | ORAL | Status: AC
Start: 2021-07-23 — End: 2021-07-24
  Administered 2021-07-23 – 2021-07-24 (×4): 1000 mg via ORAL
  Filled 2021-07-23 (×4): qty 2

## 2021-07-23 MED ORDER — BUPIVACAINE HCL (PF) 0.5 % IJ SOLN
INTRAMUSCULAR | Status: DC | PRN
  Administered 2021-07-23: 20 mL via PERINEURAL

## 2021-07-23 MED ORDER — ACETAMINOPHEN 10 MG/ML IV SOLN
1000.0000 mg | Freq: Once | INTRAVENOUS | Status: AC
  Administered 2021-07-23: 1000 mg via INTRAVENOUS
  Filled 2021-07-23: qty 100

## 2021-07-23 MED ORDER — OXYCODONE HCL 5 MG PO TABS: 5.0000 mg | ORAL_TABLET | Freq: Once | ORAL | Status: DC | PRN

## 2021-07-23 MED ORDER — ORAL CARE MOUTH RINSE: 15.0000 mL | Freq: Once | OROMUCOSAL | Status: AC

## 2021-07-23 MED ORDER — METOCLOPRAMIDE HCL 5 MG/ML IJ SOLN: 5.0000 mg | Freq: Three times a day (TID) | INTRAMUSCULAR | Status: DC | PRN

## 2021-07-23 MED ORDER — ONDANSETRON HCL 4 MG PO TABS: 4.0000 mg | ORAL_TABLET | Freq: Four times a day (QID) | ORAL | Status: DC | PRN

## 2021-07-23 MED ORDER — TIMOLOL MALEATE 0.25 % OP SOLN
1.0000 [drp] | Freq: Every day | OPHTHALMIC | Status: DC
  Administered 2021-07-24: 1 [drp] via OPHTHALMIC
  Filled 2021-07-23: qty 5

## 2021-07-23 MED ORDER — MIDAZOLAM HCL 2 MG/2ML IJ SOLN
1.0000 mg | INTRAMUSCULAR | Status: DC
  Filled 2021-07-23: qty 2

## 2021-07-23 MED ORDER — STERILE WATER FOR IRRIGATION IR SOLN
Status: DC | PRN
  Administered 2021-07-23: 2000 mL

## 2021-07-23 MED ORDER — SODIUM CHLORIDE (PF) 0.9 % IJ SOLN
INTRAMUSCULAR | Status: DC | PRN
  Administered 2021-07-23: 60 mL

## 2021-07-23 MED ORDER — METHOCARBAMOL 500 MG PO TABS
500.0000 mg | ORAL_TABLET | Freq: Four times a day (QID) | ORAL | Status: DC | PRN
  Administered 2021-07-23 – 2021-07-24 (×2): 500 mg via ORAL
  Filled 2021-07-23 (×2): qty 1

## 2021-07-23 MED ORDER — RIVAROXABAN 10 MG PO TABS
10.0000 mg | ORAL_TABLET | Freq: Every day | ORAL | Status: DC
  Administered 2021-07-24: 10 mg via ORAL
  Filled 2021-07-23: qty 1

## 2021-07-23 MED ORDER — CEFAZOLIN SODIUM-DEXTROSE 2-4 GM/100ML-% IV SOLN
2.0000 g | INTRAVENOUS | Status: AC
  Administered 2021-07-23: 2 g via INTRAVENOUS
  Filled 2021-07-23: qty 100

## 2021-07-23 MED ORDER — ONDANSETRON HCL 4 MG/2ML IJ SOLN: 4.0000 mg | Freq: Four times a day (QID) | INTRAMUSCULAR | Status: DC | PRN

## 2021-07-23 MED ORDER — PROPOFOL 10 MG/ML IV BOLUS
INTRAVENOUS | Status: DC | PRN
  Administered 2021-07-23: 30 mg via INTRAVENOUS

## 2021-07-23 MED ORDER — CITALOPRAM HYDROBROMIDE 20 MG PO TABS
20.0000 mg | ORAL_TABLET | Freq: Every day | ORAL | Status: DC
  Administered 2021-07-24: 20 mg via ORAL
  Filled 2021-07-23: qty 1

## 2021-07-23 MED ORDER — DEXAMETHASONE SODIUM PHOSPHATE 10 MG/ML IJ SOLN
10.0000 mg | Freq: Once | INTRAMUSCULAR | Status: AC
  Administered 2021-07-24: 10 mg via INTRAVENOUS
  Filled 2021-07-23: qty 1

## 2021-07-23 SURGICAL SUPPLY — 62 items
ADH SKN CLS APL DERMABOND .7 (GAUZE/BANDAGES/DRESSINGS) ×1
AUG FEM CMT STD PS 7 LT (Joint) ×2 IMPLANT
AUGMENT FEM CMT STD PS 7 LT (Joint) IMPLANT
BAG COUNTER SPONGE SURGICOUNT (BAG) IMPLANT
BAG SPEC THK2 15X12 ZIP CLS (MISCELLANEOUS) ×1
BAG SPNG CNTER NS LX DISP (BAG)
BAG ZIPLOCK 12X15 (MISCELLANEOUS) ×2 IMPLANT
BLADE SAG 18X100X1.27 (BLADE) ×2 IMPLANT
BLADE SAW SGTL 11.0X1.19X90.0M (BLADE) ×2 IMPLANT
BNDG ELASTIC 6X5.8 VLCR STR LF (GAUZE/BANDAGES/DRESSINGS) ×3 IMPLANT
BOWL SMART MIX CTS (DISPOSABLE) ×2 IMPLANT
BSPLAT TIB 5D F CMNT KN LT (Knees) ×1 IMPLANT
CEMENT HV SMART SET (Cement) ×4 IMPLANT
COVER SURGICAL LIGHT HANDLE (MISCELLANEOUS) ×2 IMPLANT
CUFF TOURN SGL QUICK 34 (TOURNIQUET CUFF) ×2
CUFF TRNQT CYL 34X4.125X (TOURNIQUET CUFF) ×1 IMPLANT
DECANTER SPIKE VIAL GLASS SM (MISCELLANEOUS) ×2 IMPLANT
DERMABOND ADVANCED (GAUZE/BANDAGES/DRESSINGS) ×1
DERMABOND ADVANCED .7 DNX12 (GAUZE/BANDAGES/DRESSINGS) IMPLANT
DRAPE INCISE IOBAN 66X45 STRL (DRAPES) ×2 IMPLANT
DRAPE U-SHAPE 47X51 STRL (DRAPES) ×2 IMPLANT
DRSG AQUACEL AG ADV 3.5X10 (GAUZE/BANDAGES/DRESSINGS) ×1 IMPLANT
DRSG PAD ABDOMINAL 8X10 ST (GAUZE/BANDAGES/DRESSINGS) ×2 IMPLANT
DURAPREP 26ML APPLICATOR (WOUND CARE) ×2 IMPLANT
ELECT REM PT RETURN 15FT ADLT (MISCELLANEOUS) ×2 IMPLANT
GAUZE SPONGE 4X4 12PLY STRL (GAUZE/BANDAGES/DRESSINGS) ×1 IMPLANT
GLOVE SRG 8 PF TXTR STRL LF DI (GLOVE) ×1 IMPLANT
GLOVE SURG ENC MOIS LTX SZ6.5 (GLOVE) ×2 IMPLANT
GLOVE SURG ENC MOIS LTX SZ8 (GLOVE) ×4 IMPLANT
GLOVE SURG UNDER POLY LF SZ7 (GLOVE) ×2 IMPLANT
GLOVE SURG UNDER POLY LF SZ8 (GLOVE) ×2
GLOVE SURG UNDER POLY LF SZ8.5 (GLOVE) ×2 IMPLANT
GOWN STRL REUS W/TWL LRG LVL3 (GOWN DISPOSABLE) ×4 IMPLANT
GOWN STRL REUS W/TWL XL LVL3 (GOWN DISPOSABLE) ×2 IMPLANT
HANDPIECE INTERPULSE COAX TIP (DISPOSABLE) ×2
HDLS TROCR DRIL PIN KNEE 75 (PIN) ×8
HOLDER FOLEY CATH W/STRAP (MISCELLANEOUS) IMPLANT
IMMOBILIZER KNEE 20 (SOFTGOODS) ×2
IMMOBILIZER KNEE 20 THIGH 36 (SOFTGOODS) ×1 IMPLANT
INSERT TIBIAL PLY L EF6-9X10 (Knees) ×1 IMPLANT
KIT TURNOVER KIT A (KITS) ×2 IMPLANT
MANIFOLD NEPTUNE II (INSTRUMENTS) ×2 IMPLANT
NS IRRIG 1000ML POUR BTL (IV SOLUTION) ×2 IMPLANT
PACK TOTAL KNEE CUSTOM (KITS) ×2 IMPLANT
PADDING CAST COTTON 6X4 STRL (CAST SUPPLIES) ×3 IMPLANT
PIN DRILL HDLS TROCAR 75 4PK (PIN) IMPLANT
PROTECTOR NERVE ULNAR (MISCELLANEOUS) ×2 IMPLANT
SCREW FEMALE HEX FIX 25X2.5 (ORTHOPEDIC DISPOSABLE SUPPLIES) ×1 IMPLANT
SET HNDPC FAN SPRY TIP SCT (DISPOSABLE) ×1 IMPLANT
STEM POLY PAT PLY 35M KNEE (Knees) ×1 IMPLANT
STEM TIBIA 5 DEG SZ F L KNEE (Knees) IMPLANT
STRIP CLOSURE SKIN 1/2X4 (GAUZE/BANDAGES/DRESSINGS) ×2 IMPLANT
SUT MNCRL AB 4-0 PS2 18 (SUTURE) ×2 IMPLANT
SUT STRATAFIX 0 PDS 27 VIOLET (SUTURE) ×2
SUT VIC AB 2-0 CT1 27 (SUTURE) ×6
SUT VIC AB 2-0 CT1 TAPERPNT 27 (SUTURE) ×3 IMPLANT
SUTURE STRATFX 0 PDS 27 VIOLET (SUTURE) ×1 IMPLANT
TIBIA STEM 5 DEG SZ F L KNEE (Knees) ×2 IMPLANT
TRAY FOLEY MTR SLVR 16FR STAT (SET/KITS/TRAYS/PACK) ×2 IMPLANT
TUBE SUCTION HIGH CAP CLEAR NV (SUCTIONS) ×2 IMPLANT
WATER STERILE IRR 1000ML POUR (IV SOLUTION) ×4 IMPLANT
WRAP KNEE MAXI GEL POST OP (GAUZE/BANDAGES/DRESSINGS) ×2 IMPLANT

## 2021-07-23 NOTE — Anesthesia Procedure Notes (Signed)
Anesthesia Regional Block: Adductor canal block   Pre-Anesthetic Checklist: , timeout performed,  Correct Patient, Correct Site, Correct Laterality,  Correct Procedure, Correct Position, site marked,  Risks and benefits discussed,  Surgical consent,  Pre-op evaluation,  At surgeon's request and post-op pain management  Laterality: Left  Prep: chloraprep       Needles:  Injection technique: Single-shot  Needle Type: Echogenic Needle     Needle Length: 9cm      Additional Needles:   Procedures:,,,, ultrasound used (permanent image in chart),,    Narrative:  Start time: 07/23/2021 8:43 AM End time: 07/23/2021 8:49 AM Injection made incrementally with aspirations every 5 mL.  Performed by: Personally  Anesthesiologist: Myrtie Soman, MD  Additional Notes: Patient tolerated the procedure well without complications

## 2021-07-23 NOTE — Plan of Care (Signed)

## 2021-07-23 NOTE — Anesthesia Preprocedure Evaluation (Signed)
Anesthesia Evaluation  Patient identified by MRN, date of birth, ID band Patient awake    Reviewed: Allergy & Precautions, NPO status , Patient's Chart, lab work & pertinent test results  Airway Mallampati: II  TM Distance: >3 FB Neck ROM: Full    Dental no notable dental hx.    Pulmonary neg pulmonary ROS,    Pulmonary exam normal breath sounds clear to auscultation       Cardiovascular hypertension, Normal cardiovascular exam Rhythm:Regular Rate:Normal     Neuro/Psych negative neurological ROS  negative psych ROS   GI/Hepatic Neg liver ROS, hiatal hernia, GERD  Medicated,  Endo/Other  negative endocrine ROS  Renal/GU negative Renal ROS  negative genitourinary   Musculoskeletal  (+) Arthritis , Osteoarthritis,    Abdominal   Peds negative pediatric ROS (+)  Hematology negative hematology ROS (+)   Anesthesia Other Findings   Reproductive/Obstetrics negative OB ROS                             Anesthesia Physical Anesthesia Plan  ASA: 2  Anesthesia Plan: Spinal   Post-op Pain Management:  Regional for Post-op pain   Induction: Intravenous  PONV Risk Score and Plan: 3 and Ondansetron, Dexamethasone, Propofol infusion and Treatment may vary due to age or medical condition  Airway Management Planned: Simple Face Mask  Additional Equipment:   Intra-op Plan:   Post-operative Plan:   Informed Consent: I have reviewed the patients History and Physical, chart, labs and discussed the procedure including the risks, benefits and alternatives for the proposed anesthesia with the patient or authorized representative who has indicated his/her understanding and acceptance.     Dental advisory given  Plan Discussed with: CRNA and Surgeon  Anesthesia Plan Comments:         Anesthesia Quick Evaluation

## 2021-07-23 NOTE — Progress Notes (Signed)
Assisted Dr. Rose with left, ultrasound guided, adductor canal block. Side rails up, monitors on throughout procedure. See vital signs in flow sheet. Tolerated Procedure well.  

## 2021-07-23 NOTE — Op Note (Signed)
OPERATIVE REPORT-TOTAL KNEE ARTHROPLASTY   Pre-operative diagnosis- Osteoarthritis  Left knee(s)  Post-operative diagnosis- Osteoarthritis Left knee(s)  Procedure-  Left  Total Knee Arthroplasty  Surgeon- Dione Plover. Helayna Dun, MD  Assistant- Molli Barrows, PA-C   Anesthesia-   Adductor canal block and spinal  EBL-100 mL   Drains None  Tourniquet time-  Total Tourniquet Time Documented: Thigh (Left) - 38 minutes Total: Thigh (Left) - 38 minutes     Complications- None  Condition-PACU - hemodynamically stable.   Brief Clinical Note  Morgan Elliott is a 75 y.o. year old female with end stage OA of her left knee with progressively worsening pain and dysfunction. She has constant pain, with activity and at rest and significant functional deficits with difficulties even with ADLs. She has had extensive non-op management including analgesics, injections of cortisone and viscosupplements, and home exercise program, but remains in significant pain with significant dysfunction. Radiographs show bone on bone arthritis lateral and patellofemoral. She presents now for left Total Knee Arthroplasty.     Procedure in detail---   The patient is brought into the operating room and positioned supine on the operating table. After successful administration of  Adductor canal block and spinal,   a tourniquet is placed high on the  Left thigh(s) and the lower extremity is prepped and draped in the usual sterile fashion. Time out is performed by the operating team and then the  Left lower extremity is wrapped in Esmarch, knee flexed and the tourniquet inflated to 300 mmHg.       A midline incision is made with a ten blade through the subcutaneous tissue to the level of the extensor mechanism. A fresh blade is used to make a medial parapatellar arthrotomy. Soft tissue over the proximal medial tibia is subperiosteally elevated to the joint line with a knife and into the semimembranosus bursa with a Cobb  elevator. Soft tissue over the proximal lateral tibia is elevated with attention being paid to avoiding the patellar tendon on the tibial tubercle. The patella is everted, knee flexed 90 degrees and the ACL and PCL are removed. Findings are bone on bone lateral and patellofemoral with large global osteophytes         The drill is used to create a starting hole in the distal femur and the canal is thoroughly irrigated with sterile saline to remove the fatty contents. The 5 degree Left  valgus alignment guide is placed into the femoral canal and the distal femoral cutting block is pinned to remove 10 mm off the distal femur. Resection is made with an oscillating saw.      The tibia is subluxed forward and the menisci are removed. The extramedullary alignment guide is placed referencing proximally at the medial aspect of the tibial tubercle and distally along the second metatarsal axis and tibial crest. The block is pinned to remove 81mm off the more deficient lateral  side. Resection is made with an oscillating saw. Size F is the most appropriate size for the tibia and the proximal tibia is prepared with the modular drill and keel punch for that size.      The femoral sizing guide is placed and size 7 is most appropriate. Rotation is marked off the epicondylar axis and confirmed by creating a rectangular flexion gap at 90 degrees. The size 7 cutting block is pinned in this rotation and the anterior, posterior and chamfer cuts are made with the oscillating saw. The intercondylar block is then placed and that  cut is made.      Trial size F tibial component, trial size 7 posterior stabilized femur and a 10  mm posterior stabilized fixed bearing insert trial is placed. Full extension is achieved with excellent varus/valgus and anterior/posterior balance throughout full range of motion. The patella is everted and thickness measured to be 22  mm. Free hand resection is taken to 12 mm, a 35 template is placed, lug holes  are drilled, trial patella is placed, and it tracks normally. Osteophytes are removed off the posterior femur with the trial in place. All trials are removed and the cut bone surfaces prepared with pulsatile lavage. Cement is mixed and once ready for implantation, the size F tibial implant, size  7 posterior stabilized femoral component, and the size 35 patella are cemented in place and the patella is held with the clamp. The trial insert is placed and the knee held in full extension. The Exparel (20 ml mixed with 60 ml saline) is injected into the extensor mechanism, posterior capsule, medial and lateral gutters and subcutaneous tissues.  All extruded cement is removed and once the cement is hard the permanent 10 mm posterior stabilized fixed bearing insert is placed into the tibial tray.      The wound is copiously irrigated with saline solution and the extensor mechanism closed with # 0 Stratofix suture. The tourniquet is released for a total tourniquet time of 38  minutes. Flexion against gravity is 140 degrees and the patella tracks normally. Subcutaneous tissue is closed with 2.0 vicryl and subcuticular with running 4.0 Monocryl. The incision is cleaned and dried and steri-strips and a bulky sterile dressing are applied. The limb is placed into a knee immobilizer and the patient is awakened and transported to recovery in stable condition.      Please note that a surgical assistant was a medical necessity for this procedure in order to perform it in a safe and expeditious manner. Surgical assistant was necessary to retract the ligaments and vital neurovascular structures to prevent injury to them and also necessary for proper positioning of the limb to allow for anatomic placement of the prosthesis.   Dione Plover Morgan Phillippi, MD    07/23/2021, 12:41 PM

## 2021-07-23 NOTE — Anesthesia Procedure Notes (Signed)
Spinal  Patient location during procedure: OR Start time: 07/23/2021 9:34 AM End time: 07/23/2021 9:36 AM Reason for block: surgical anesthesia Staffing Performed: resident/CRNA  Resident/CRNA: [place] Emer Onnen C, CRNA Preanesthetic Checklist Completed: patient identified, IV checked, site marked, risks and benefits discussed, surgical consent, monitors and equipment checked, pre-op evaluation and timeout performed Spinal Block Patient position: sitting Prep: DuraPrep and site prepped and draped Patient monitoring: heart rate, cardiac monitor, continuous pulse ox and blood pressure Approach: midline Location: L3-4 Injection technique: single-shot Needle Needle type: Pencan  Needle gauge: 24 G Needle length: 9 cm Assessment Sensory level: T4 Events: CSF return Additional Notes IV functioning, monitors applied to pt. Expiration date of kit checked and confirmed to be in date. Sterile prep and drape, hand hygiene and sterile gloved used. Pt was positioned and spine was prepped in sterile fashion. Skin was anesthetized with lidocaine. Free flow of clear CSF obtained prior to injecting local anesthetic into CSF x 1 attempt. Spinal needle aspirated freely following injection. Needle was carefully withdrawn, and pt tolerated procedure well. Loss of motor and sensory on exam post injection.

## 2021-07-23 NOTE — Anesthesia Procedure Notes (Signed)
Anesthesia Procedure Image    

## 2021-07-23 NOTE — Evaluation (Signed)
Physical Therapy Evaluation Patient Details Name: Morgan Elliott MRN: [id number] DOB: Jun 18, 1946 Today's Date: 07/23/2021  History of Present Illness  74 yo female s/p L TKA. PMH: breast CA, cervical fusion, HTN, L THA  Clinical Impression  Pt is s/p TKA resulting in the deficits listed below (see PT Problem List).  Pt doing well today. Amb ~ 78' with RW and min assist. Anticipate steady progress. HEP initiated  Pt will benefit from skilled PT to increase their independence and safety with mobility to allow discharge to the venue listed below.         Recommendations for follow up therapy are one component of a multi-disciplinary discharge planning process, led by the attending physician.  Recommendations may be updated based on patient status, additional functional criteria and insurance authorization.  Follow Up Recommendations Follow surgeon's recommendation for DC plan and follow-up therapies    Equipment Recommendations  None recommended by PT    Recommendations for Other Services       Precautions / Restrictions Precautions Precautions: Fall;Knee Required Braces or Orthoses: Knee Immobilizer - Left Knee Immobilizer - Left: Discontinue once straight leg raise with < 10 degree lag Restrictions Weight Bearing Restrictions: No Other Position/Activity Restrictions: WBAT      Mobility  Bed Mobility Overal bed mobility: Needs Assistance Bed Mobility: Supine to Sit     Supine to sit: Min assist     General bed mobility comments: assist with LLE off bed, incr time    Transfers Overall transfer level: Needs assistance Equipment used: Rolling walker (2 wheeled) Transfers: Sit to/from Stand Sit to Stand: Min assist         General transfer comment: cues for hand placement and LLE position  Ambulation/Gait Ambulation/Gait assistance: Min assist;Min guard Gait Distance (Feet): 55 Feet Assistive device: Rolling walker (2 wheeled) Gait Pattern/deviations: Step-to  pattern;Decreased stance time - left     General Gait Details: cues for sequence and distance from [employer]            Wheelchair Mobility    Modified Rankin (Stroke Patients Only)       Balance                                             Pertinent Vitals/Pain Pain Assessment: 0-10 Pain Location: L knee Pain Descriptors / Indicators: Grimacing Pain Intervention(s): Limited activity within patient's tolerance;Monitored during session;Repositioned    Home Living Family/patient expects to be discharged to:: Private residence Living Arrangements: Spouse/significant other Available Help at Discharge: Family Type of Home: House Home Access: Stairs to enter Entrance Stairs-Rails: Psychiatric nurse of Steps: 3 Home Layout: Able to live on main level with bedroom/bathroom Home Equipment: Walker - 2 wheels;Cane - single point;Crutches;Toilet riser      Prior Function Level of Independence: Independent               Hand Dominance        Extremity/Trunk Assessment   Upper Extremity Assessment Upper Extremity Assessment: Overall WFL for tasks assessed    Lower Extremity Assessment Lower Extremity Assessment: LLE deficits/detail LLE Deficits / Details: ankle WFL, knee extension and hip flexion 2+/5       Communication   Communication: No difficulties  Cognition Arousal/Alertness: Awake/alert Behavior During Therapy: WFL for tasks assessed/performed Overall Cognitive Status: Within Functional Limits for tasks assessed  General Comments      Exercises Total Joint Exercises Ankle Circles/Pumps: AROM;Both;10 reps Quad Sets: Both;5 reps;AROM   Assessment/Plan    PT Assessment Patient needs continued PT services  PT Problem List Decreased strength;Decreased mobility;Decreased range of motion;Decreased activity tolerance;Decreased balance;Decreased knowledge of  use of DME;Pain       PT Treatment Interventions DME instruction;Gait training;Functional mobility training;Therapeutic activities;Therapeutic exercise;Patient/family education;Stair training;Balance training    PT Goals (Current goals can be found in the Care Plan section)  Acute Rehab PT Goals Patient Stated Goal: less knee pain PT Goal Formulation: With patient Time For Goal Achievement: 07/30/21 Potential to Achieve Goals: Good    Frequency 7X/week   Barriers to discharge        Co-evaluation               AM-PAC PT "6 Clicks" Mobility  Outcome Measure Help needed turning from your back to your side while in a flat bed without using bedrails?: A Little Help needed moving from lying on your back to sitting on the side of a flat bed without using bedrails?: A Little Help needed moving to and from a bed to a chair (including a wheelchair)?: A Little Help needed standing up from a chair using your arms (e.g., wheelchair or bedside chair)?: A Little Help needed to walk in hospital room?: A Little Help needed climbing 3-5 steps with a railing? : A Little 6 Click Score: 18    End of Session Equipment Utilized During Treatment: Gait belt;Left knee immobilizer Activity Tolerance: Patient tolerated treatment well Patient left: with call bell/phone within reach;with chair alarm set;in chair;with family/visitor present   PT Visit Diagnosis: Other abnormalities of gait and mobility (R26.89);Difficulty in walking, not elsewhere classified (R26.2)    Time: 5974-1638 PT Time Calculation (min) (ACUTE ONLY): 28 min   Charges:   PT Evaluation $PT Eval Low Complexity: 1 Low PT Treatments $Gait Training: 8-22 mins        Baxter Flattery, PT  Acute Rehab Dept ([facility]) [phone] Pager [phone]  07/23/2021   [facility] 07/23/2021, 5:13 PM

## 2021-07-23 NOTE — Progress Notes (Signed)
Orthopedic Tech Progress Note Patient Details:  Morgan Elliott 1946/03/22 [id number]  CPM Left Knee CPM Left Knee: On Left Knee Flexion (Degrees): 40 Left Knee Extension (Degrees): 10  Post Interventions Patient Tolerated: Well Instructions Provided: Care of device  [name] 07/23/2021, 11:55 AM

## 2021-07-23 NOTE — Interval H&P Note (Signed)
History and Physical Interval Note:  07/23/2021 6:54 AM  Morgan Elliott  has presented today for surgery, with the diagnosis of left knee osteoarthritis.  The various methods of treatment have been discussed with the patient and family. After consideration of risks, benefits and other options for treatment, the patient has consented to  Procedure(s): TOTAL KNEE ARTHROPLASTY (Left) as a surgical intervention.  The patient's history has been reviewed, patient examined, no change in status, stable for surgery.  I have reviewed the patient's chart and labs.  Questions were answered to the patient's satisfaction.     Pilar Plate Saketh Daubert

## 2021-07-23 NOTE — Care Plan (Signed)
Ortho Bundle Case Management Note  Patient Details  Name: Morgan Elliott MRN: [id number] Date of Birth: May 28, 1946  L TKA on 07-23-21 DCP:  Home with husband.  3 story home with 3 ste. DME:  No needs.  Has a RW and 3-in-1. PT:  [facility] PT.  Eval scheduled on 07-27-21.                   DME Arranged:  N/A DME Agency:  NA  HH Arranged:  NA HH Agency:  NA  Additional Comments: Please contact me with any questions if this plan should need to change.  Marianne Sofia, RN,CCM [facility]  [phone] 07/23/2021, 8:16 AM

## 2021-07-23 NOTE — H&P (Signed)
TOTAL KNEE ADMISSION H&P  Patient is being admitted for left total knee arthroplasty.  Subjective:  Chief Complaint: Left knee pain.  HPI: Morgan Elliott, 75 y.o. female has a history of pain and functional disability in the left knee due to arthritis and has failed non-surgical conservative treatments for greater than 12 weeks to include NSAID's and/or analgesics, flexibility and strengthening excercises, and activity modification. Onset of symptoms was gradual, starting  several  years ago with gradually worsening course since that time. The patient noted no past surgery on the left knee.  Patient currently rates pain in the left knee at 7 out of 10 with activity. Patient has worsening of pain with activity and weight bearing, pain that interferes with activities of daily living, pain with passive range of motion, and crepitus. Patient has evidence of periarticular osteophytes and joint space narrowing by imaging studies. There is no active infection.  Patient Active Problem List   Diagnosis Date Noted   OA (osteoarthritis) of hip 06/05/2016   Essential hypertension 01/31/2015   Breast cancer, left breast ([place]) 12/09/2011   Atypical ductal hyperplasia of breast 12/09/2011    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   Breast cancer ([place])    Chronic kidney disease    recent dx of stage 3A Chronic kidney disease   DCIS (ductal carcinoma in situ) of breast 12/09/2011   Depression    GERD (gastroesophageal reflux disease)    H/O hiatal hernia    Hematoma    right breast   Hypertension    borderline   IBS (irritable bowel syndrome)    Osteopenia    Pneumonia    SVD (spontaneous vaginal delivery)    x 1   Varicose veins of both lower extremities     Past Surgical History:  Procedure Laterality Date   BREAST BIOPSY  5/08, 12/2009    x 2 left   BREAST BIOPSY Right 05/2014   Benign     BREAST LUMPECTOMY  06/2010   x 2 right    CERVICAL FUSION  02/2003   C3-C6   COLONOSCOPY     DIAGNOSTIC LAPAROSCOPY     tubal ligation   DILATATION & CURRETTAGE/HYSTEROSCOPY WITH RESECTOCOPE N/A 01/11/2014   Procedure: DILATATION & CURETTAGE/HYSTEROSCOPY WITH RESECTOCOPE;  Surgeon: Lyman Speller, MD;  Location: [facility];  Service: Gynecology;  Laterality: N/A;  resection of endometrial polyp   DILATION AND CURETTAGE OF UTERUS     x3 for heavy bleeding   EYE SURGERY     lasik left eye   FOOT SURGERY Left    KNEE ARTHROSCOPY  2012   left   TOTAL HIP ARTHROPLASTY Left 06/05/2016   Procedure: LEFT TOTAL HIP ARTHROPLASTY ANTERIOR APPROACH;  Surgeon: Gaynelle Arabian, MD;  Location: [facility];  Service: Orthopedics;  Laterality: Left;   TUBAL LIGATION     and lap   UPPER GI ENDOSCOPY  ~2013    Prior to Admission medications   Medication Sig Start Date End Date Taking? Authorizing Provider  alendronate (FOSAMAX) 70 MG tablet Take 70 mg by mouth every Thursday. 03/01/18  Yes [provider]  amLODipine (NORVASC) 5 MG tablet Take 5 mg by mouth every evening.   Yes [provider]  aspirin EC 81 MG tablet Take 81 mg by mouth at bedtime. Swallow whole.   Yes [provider]  calcium citrate-vitamin  D 500-400 MG-UNIT chewable tablet Chew 1 tablet by mouth daily.   Yes [provider]  cetirizine (ZYRTEC) 10 MG tablet Take 10 mg by mouth at bedtime.    Yes [provider]  cholecalciferol (VITAMIN D3) 25 MCG (1000 UNIT) tablet Take 1,000 Units by mouth daily.   Yes [provider]  citalopram (CELEXA) 20 MG tablet Take 1 tablet (20 mg total) by mouth daily. 12/06/13  Yes Megan Salon, MD  clindamycin (CLEOCIN T) 1 % lotion Apply 1 application topically daily as needed (acne).   Yes [provider]  fluticasone (FLONASE) 50 MCG/ACT nasal spray Place 1 spray into both nostrils daily as needed for allergies. Reported on 04/09/2016   Yes  [provider]  guaifenesin (HUMIBID E) 400 MG TABS tablet Take 400 mg by mouth at bedtime.   Yes [provider]  losartan (COZAAR) 50 MG tablet Take 25 mg by mouth every evening.   Yes [provider]  Multiple Vitamins-Minerals (MULTIVITAMIN WITH MINERALS) tablet Take 1 tablet by mouth daily.   Yes [provider]  OVER THE COUNTER MEDICATION Take 25 mg by mouth at bedtime. CBD Oil   Yes [provider]  Probiotic Product (ALIGN) 4 MG CAPS Take 4 mg by mouth daily.   Yes [provider]  timolol (BETIMOL) 0.25 % ophthalmic solution Place 1 drop into both eyes every morning.   Yes [provider]  zaleplon (SONATA) 5 MG capsule TAKE 1 CAPSULE AT BEDTIME AS NEEDED   Yes Elveria Rising, MD    Allergies  Allergen Reactions   Nickel     Social History   Socioeconomic History   Marital status: Married    Spouse name: Not on file   Number of children: Not on file   Years of education: Not on file   Highest education level: Not on file  Occupational History   Not on file  Tobacco Use   Smoking status: Never   Smokeless tobacco: Never  Vaping Use   Vaping Use: Never used  Substance and Sexual Activity   Alcohol use: Yes    Alcohol/week: 7.0 standard drinks    Types: 7 Glasses of wine per week    Comment: wine daily    Drug use: No   Sexual activity: Yes    Birth control/protection: Post-menopausal, Surgical  Other Topics Concern   Not on file  Social History Narrative   Not on file   Social Determinants of Health   Financial Resource Strain: Not on file  Food Insecurity: Not on file  Transportation Needs: Not on file  Physical Activity: Not on file  Stress: Not on file  Social Connections: Not on file  Intimate Partner Violence: Not on file    Tobacco Use: Low Risk    Smoking Tobacco Use: Never   Smokeless Tobacco Use: Never   Social History   Substance and Sexual Activity  Alcohol Use Yes    Alcohol/week: 7.0 standard drinks   Types: 7 Glasses of wine per week   Comment: wine daily     Family History  Problem Relation Age of Onset   Hypertension Mother    Heart attack Mother    Hypertension Father    Cancer Maternal Aunt 84       leukemia, Breast cancer   Breast cancer Maternal Aunt    Breast cancer Sister 38   Cancer Paternal Grandfather 8       GI related  ROS: Constitutional: no fever, no chills, no night sweats, no significant weight loss Cardiovascular: no chest pain, no palpitations Respiratory: no cough, no shortness of breath, No COPD Gastrointestinal: no vomiting, no nausea Musculoskeletal: no swelling in Joints, Joint Pain Neurologic: no numbness, no tingling, no difficulty with balance   Objective:  Physical Exam: Well nourished and well developed.  General: Alert and oriented x3, cooperative and pleasant, no acute distress.  Head: normocephalic, atraumatic, neck supple.  Eyes: EOMI.  Respiratory: breath sounds clear in all fields, no wheezing, rales, or rhonchi. Cardiovascular: Regular rate and rhythm, no murmurs, gallops or rubs.  Abdomen: non-tender to palpation and soft, normoactive bowel sounds. Musculoskeletal:   Bilateral Hip Exam:   The range of motion: normal without discomfort.     Right Knee Exam:   No effusion present. No swelling present.   The range of motion is: 0 to 135 degrees.   Positive crepitus on range of motion of the knee.   Positive medial greater than lateral joint line tenderness.   The knee is stable.     Left Knee Exam:   No effusion present. No swelling present.   The Range of motion is: 0 to 125 degrees.   Marked crepitus on range of motion of the knee.   Lateral greater than medial joint line tenderness.   The knee is stable.  Calves soft and nontender. Motor function intact in LE. Strength 5/5 LE bilaterally. Neuro: Distal pulses 2+. Sensation to light touch intact in LE.    Vital signs in last 24  hours:    Imaging Review Radiographs- bilateral AP and lateral of the left knee dated 06/08/2021 demonstrate bone-on-bone arthritis in the patellofemoral compartment and near bone-on-bone in the lateral compartment of the left knee.  Assessment/Plan:  End stage arthritis, left knee   The patient history, physical examination, clinical judgment of the provider and imaging studies are consistent with end stage degenerative joint disease of the left knee and total knee arthroplasty is deemed medically necessary. The treatment options including medical management, injection therapy arthroscopy and arthroplasty were discussed at length. The risks and benefits of total knee arthroplasty were presented and reviewed. The risks due to aseptic loosening, infection, stiffness, patella tracking problems, thromboembolic complications and other imponderables were discussed. The patient acknowledged the explanation, agreed to proceed with the plan and consent was signed. Patient is being admitted for inpatient treatment for surgery, pain control, PT, OT, prophylactic antibiotics, VTE prophylaxis, progressive ambulation and ADLs and discharge planning. The patient is planning to be discharged  home .   Patient's anticipated LOS is less than 2 midnights, meeting these requirements: - Younger than 50 - Lives within 1 hour of care - Has a competent adult at home to recover with post-op recover - NO history of  - Chronic pain requiring opiods  - Diabetes  - Coronary Artery Disease  - Heart failure  - Heart attack  - Stroke  - DVT/VTE  - Cardiac arrhythmia  - Respiratory Failure/COPD  - Renal failure  - Anemia  - Advanced Liver disease   Therapy Plans: [facility] PT Disposition: Home with Husband Planned DVT Prophylaxis: Xarelto 10mg  (Breast CA) DME Needed: None PCP: Cari Caraway, MD TXA: IV Allergies: Nickel allergy** Had a reaction to to aquacel dressing when her hip was replaced.  Anesthesia  Concerns: None BMI: 28.2 Last HgbA1c: N/A  Ph[facility] on [address]    - Patient was instructed on what medications to stop prior to  surgery. - Follow-up visit in 2 weeks with Dr. Wynelle Link - Begin physical therapy following surgery - Pre-operative lab work as pre-surgical testing - Prescriptions will be provided in hospital at time of discharge  Fenton Foy, [facility], PA-C Orthopedic Surgery [facility] [place]

## 2021-07-23 NOTE — Discharge Instructions (Addendum)
 Frank Aluisio, MD Total Joint Specialist [facility] [address] [place], [place] [address] [phone]  TOTAL KNEE REPLACEMENT POSTOPERATIVE DIRECTIONS    Knee Rehabilitation, Guidelines Following Surgery  Results after knee surgery are often greatly improved when you follow the exercise, range of motion and muscle strengthening exercises prescribed by your doctor. Safety measures are also important to protect the knee from further injury. If any of these exercises cause you to have increased pain or swelling in your knee joint, decrease the amount until you are comfortable again and slowly increase them. If you have problems or questions, call your caregiver or physical therapist for advice.   BLOOD CLOT PREVENTION Take a 10 mg Xarelto once a day for three weeks following surgery. Then resume one 81 mg aspirin once a day. You may resume your vitamins/supplements once you have discontinued the Xarelto. Do not take any NSAIDs (Advil, Aleve, Ibuprofen, Meloxicam, etc.) until you have discontinued the Xarelto.   HOME CARE INSTRUCTIONS  Remove items at home which could result in a fall. This includes throw rugs or furniture in walking pathways.  ICE to the affected knee as much as tolerated. Icing helps control swelling. If the swelling is well controlled you will be more comfortable and rehab easier. Continue to use ice on the knee for pain and swelling from surgery. You may notice swelling that will progress down to the foot and ankle. This is normal after surgery. Elevate the leg when you are not up walking on it.    Continue to use the breathing machine which will help keep your temperature down. It is common for your temperature to cycle up and down following surgery, especially at night when you are not up moving around and exerting yourself. The breathing machine keeps your lungs expanded and your temperature down. Do not place pillow under the operative  knee, focus on keeping the knee straight while resting  DIET You may resume your previous home diet once you are discharged from the hospital.  DRESSING / WOUND CARE / SHOWERING Keep your bulky bandage on for 2 days. On the third post-operative day you may remove the Ace bandage and gauze. There is a waterproof adhesive bandage on your skin which will stay in place until your first follow-up appointment. Once you remove this you will not need to place another bandage You may begin showering 3 days following surgery, but do not submerge the incision under water.  ACTIVITY For the first 5 days, the key is rest and control of pain and swelling Do your home exercises twice a day starting on post-operative day 3. On the days you go to physical therapy, just do the home exercises once that day. You should rest, ice and elevate the leg for 50 minutes out of every hour. Get up and walk/stretch for 10 minutes per hour. After 5 days you can increase your activity slowly as tolerated. Walk with your walker as instructed. Use the walker until you are comfortable transitioning to a cane. Walk with the cane in the opposite hand of the operative leg. You may discontinue the cane once you are comfortable and walking steadily. Avoid periods of inactivity such as sitting longer than an hour when not asleep. This helps prevent blood clots.  You may discontinue the knee immobilizer once you are able to perform a straight leg raise while lying down. You may resume a sexual relationship in one month or when given the OK by your   doctor.  You may return to work once you are cleared by your doctor.  Do not drive a car for 6 weeks or until released by your surgeon.  Do not drive while taking narcotics.  TED HOSE STOCKINGS Wear the elastic stockings on both legs for three weeks following surgery during the day. You may remove them at night for sleeping.  WEIGHT BEARING Weight bearing as tolerated with assist device  (walker, cane, etc) as directed, use it as long as suggested by your surgeon or therapist, typically at least 4-6 weeks.  POSTOPERATIVE CONSTIPATION PROTOCOL Constipation - defined medically as fewer than three stools per week and severe constipation as less than one stool per week.  One of the most common issues patients have following surgery is constipation.  Even if you have a regular bowel pattern at home, your normal regimen is likely to be disrupted due to multiple reasons following surgery.  Combination of anesthesia, postoperative narcotics, change in appetite and fluid intake all can affect your bowels.  In order to avoid complications following surgery, here are some recommendations in order to help you during your recovery period.  Colace (docusate) - Pick up an over-the-counter form of Colace or another stool softener and take twice a day as long as you are requiring postoperative pain medications.  Take with a full glass of water daily.  If you experience loose stools or diarrhea, hold the colace until you stool forms back up. If your symptoms do not get better within 1 week or if they get worse, check with your doctor. Dulcolax (bisacodyl) - Pick up over-the-counter and take as directed by the product packaging as needed to assist with the movement of your bowels.  Take with a full glass of water.  Use this product as needed if not relieved by Colace only.  MiraLax (polyethylene glycol) - Pick up over-the-counter to have on hand. MiraLax is a solution that will increase the amount of water in your bowels to assist with bowel movements.  Take as directed and can mix with a glass of water, juice, soda, coffee, or tea. Take if you go more than two days without a movement. Do not use MiraLax more than once per day. Call your doctor if you are still constipated or irregular after using this medication for 7 days in a row.  If you continue to have problems with postoperative constipation, please  contact the office for further assistance and recommendations.  If you experience "the worst abdominal pain ever" or develop nausea or vomiting, please contact the office immediatly for further recommendations for treatment.  ITCHING If you experience itching with your medications, try taking only a single pain pill, or even half a pain pill at a time.  You can also use Benadryl over the counter for itching or also to help with sleep.   MEDICATIONS See your medication summary on the "After Visit Summary" that the nursing staff will review with you prior to discharge.  You may have some home medications which will be placed on hold until you complete the course of blood thinner medication.  It is important for you to complete the blood thinner medication as prescribed by your surgeon.  Continue your approved medications as instructed at time of discharge.  PRECAUTIONS If you experience chest pain or shortness of breath - call 911 immediately for transfer to the hospital emergency department.  If you develop a fever greater that 101 F, purulent drainage from wound, increased redness or  drainage from wound, foul odor from the wound/dressing, or calf pain - CONTACT YOUR SURGEON.                                                   FOLLOW-UP APPOINTMENTS Make sure you keep all of your appointments after your operation with your surgeon and caregivers. You should call the office at the above phone number and make an appointment for approximately two weeks after the date of your surgery or on the date instructed by your surgeon outlined in the "After Visit Summary".  RANGE OF MOTION AND STRENGTHENING EXERCISES  Rehabilitation of the knee is important following a knee injury or an operation. After just a few days of immobilization, the muscles of the thigh which control the knee become weakened and shrink (atrophy). Knee exercises are designed to build up the tone and strength of the thigh muscles and to  improve knee motion. Often times heat used for twenty to thirty minutes before working out will loosen up your tissues and help with improving the range of motion but do not use heat for the first two weeks following surgery. These exercises can be done on a training (exercise) mat, on the floor, on a table or on a bed. Use what ever works the best and is most comfortable for you Knee exercises include:  Leg Lifts - While your knee is still immobilized in a splint or cast, you can do straight leg raises. Lift the leg to 60 degrees, hold for 3 sec, and slowly lower the leg. Repeat 10-20 times 2-3 times daily. Perform this exercise against resistance later as your knee gets better.  Quad and Hamstring Sets - Tighten up the muscle on the front of the thigh (Quad) and hold for 5-10 sec. Repeat this 10-20 times hourly. Hamstring sets are done by pushing the foot backward against an object and holding for 5-10 sec. Repeat as with quad sets.  Leg Slides: Lying on your back, slowly slide your foot toward your buttocks, bending your knee up off the floor (only go as far as is comfortable). Then slowly slide your foot back down until your leg is flat on the floor again. Angel Wings: Lying on your back spread your legs to the side as far apart as you can without causing discomfort.  A rehabilitation program following serious knee injuries can speed recovery and prevent re-injury in the future due to weakened muscles. Contact your doctor or a physical therapist for more information on knee rehabilitation.   POST-OPERATIVE OPIOID TAPER INSTRUCTIONS: It is important to wean off of your opioid medication as soon as possible. If you do not need pain medication after your surgery it is ok to stop day one. Opioids include: Codeine, Hydrocodone(Norco, Vicodin), Oxycodone(Percocet, oxycontin) and hydromorphone amongst others.  Long term and even short term use of opiods can cause: Increased pain  response Dependence Constipation Depression Respiratory depression And more.  Withdrawal symptoms can include Flu like symptoms Nausea, vomiting And more Techniques to manage these symptoms Hydrate well Eat regular healthy meals Stay active Use relaxation techniques(deep breathing, meditating, yoga) Do Not substitute Alcohol to help with tapering If you have been on opioids for less than two weeks and do not have pain than it is ok to stop all together.  Plan to wean off of opioids This plan  should start within one week post op of your joint replacement. Maintain the same interval or time between taking each dose and first decrease the dose.  Cut the total daily intake of opioids by one tablet each day Next start to increase the time between doses. The last dose that should be eliminated is the evening dose.   IF YOU ARE TRANSFERRED TO A SKILLED REHAB FACILITY If the patient is transferred to a skilled rehab facility following release from the hospital, a list of the current medications will be sent to the facility for the patient to continue.  When discharged from the skilled rehab facility, please have the facility set up the patient's [place] prior to being released. Also, the skilled facility will be responsible for providing the patient with their medications at time of release from the facility to include their pain medication, the muscle relaxants, and their blood thinner medication. If the patient is still at the rehab facility at time of the two week follow up appointment, the skilled rehab facility will also need to assist the patient in arranging follow up appointment in our office and any transportation needs.  MAKE SURE YOU:  Understand these instructions.  Get help right away if you are not doing well or get worse.   DENTAL ANTIBIOTICS:  In most cases prophylactic antibiotics for Dental procdeures after total joint surgery are not  necessary.  Exceptions are as follows:  1. History of prior total joint infection  2. Severely immunocompromised (Organ Transplant, cancer chemotherapy, Rheumatoid biologic meds such as [place])  3. Poorly controlled diabetes (A1C &gt; 8.0, blood glucose over 200)  If you have one of these conditions, contact your surgeon for an antibiotic prescription, prior to your dental procedure.    Pick up stool softner and laxative for home use following surgery while on pain medications. Do not submerge incision under water. Please use good hand washing techniques while changing dressing each day. May shower starting three days after surgery. Please use a clean towel to pat the incision dry following showers. Continue to use ice for pain and swelling after surgery. Do not use any lotions or creams on the incision until instructed by your surgeon.   Information on my medicine - XARELTO (Rivaroxaban)  This medication education was reviewed with me or my healthcare representative as part of my discharge preparation.  The pharmacist that spoke with me during my hospital stay was:    Why was Xarelto prescribed for you? Xarelto was prescribed for you to reduce the risk of blood clots forming after orthopedic surgery. The medical term for these abnormal blood clots is venous thromboembolism (VTE).  What do you need to know about xarelto ? Take your Xarelto ONCE DAILY at the same time every day. You may take it either with or without food.  If you have difficulty swallowing the tablet whole, you may crush it and mix in applesauce just prior to taking your dose.  Take Xarelto exactly as prescribed by your doctor and DO NOT stop taking Xarelto without talking to the doctor who prescribed the medication.  Stopping without other VTE prevention medication to take the place of Xarelto may increase your risk of developing a clot.  After discharge, you should have regular check-up appointments  with your healthcare provider that is prescribing your Xarelto.    What do you do if you miss a dose? If you miss a dose, take it as soon as you remember on the  same day then continue your regularly scheduled once daily regimen the next day. Do not take two doses of Xarelto on the same day.   Important Safety Information A possible side effect of Xarelto is bleeding. You should call your healthcare provider right away if you experience any of the following: Bleeding from an injury or your nose that does not stop. Unusual colored urine (red or dark brown) or unusual colored stools (red or black). Unusual bruising for unknown reasons. A serious fall or if you hit your head (even if there is no bleeding).  Some medicines may interact with Xarelto and might increase your risk of bleeding while on Xarelto. To help avoid this, consult your healthcare provider or pharmacist prior to using any new prescription or non-prescription medications, including herbals, vitamins, non-steroidal anti-inflammatory drugs (NSAIDs) and supplements.  This website has more information on Xarelto: [email or web address]

## 2021-07-23 NOTE — Plan of Care (Signed)
  Problem: Clinical Measurements: Goal: Postoperative complications will be avoided or minimized Outcome: Progressing   Problem: Activity: Goal: Ability to avoid complications of mobility impairment will improve Outcome: Progressing   Problem: Pain Management: Goal: Pain level will decrease with appropriate interventions Outcome: Progressing

## 2021-07-23 NOTE — Transfer of Care (Signed)
Immediate Anesthesia Transfer of Care Note  Patient: Morgan Elliott  Procedure(s) Performed: TOTAL KNEE ARTHROPLASTY (Left: Knee)  Patient Location: PACU  Anesthesia Type:MAC  Level of Consciousness: awake  Airway & Oxygen Therapy: Patient Spontanous Breathing and Patient connected to face mask oxygen  Post-op Assessment: Report given to RN and Post -op Vital signs reviewed and stable  Post vital signs: Reviewed and stable  Last Vitals:  Vitals Value Taken Time  BP 137/91 07/23/21 1230  Temp 36.8 C 07/23/21 1230  Pulse 64 07/23/21 1230  Resp 16 07/23/21 1230  SpO2 98 % 07/23/21 1230    Last Pain:  Vitals:   07/23/21 1329  TempSrc:   PainSc: 6       Patients Stated Pain Goal: 2 (55/73/22 0254)  Complications: No notable events documented.

## 2021-07-24 ENCOUNTER — Encounter (HOSPITAL_COMMUNITY): Payer: Self-pay | Admitting: Orthopedic Surgery

## 2021-07-24 DIAGNOSIS — Z853 Personal history of malignant neoplasm of breast: Secondary | ICD-10-CM | POA: Diagnosis not present

## 2021-07-24 DIAGNOSIS — Z96642 Presence of left artificial hip joint: Secondary | ICD-10-CM | POA: Diagnosis not present

## 2021-07-24 DIAGNOSIS — Z7982 Long term (current) use of aspirin: Secondary | ICD-10-CM | POA: Diagnosis not present

## 2021-07-24 DIAGNOSIS — Z79899 Other long term (current) drug therapy: Secondary | ICD-10-CM | POA: Diagnosis not present

## 2021-07-24 DIAGNOSIS — M1712 Unilateral primary osteoarthritis, left knee: Secondary | ICD-10-CM | POA: Diagnosis not present

## 2021-07-24 DIAGNOSIS — I10 Essential (primary) hypertension: Secondary | ICD-10-CM | POA: Diagnosis not present

## 2021-07-24 LAB — CBC
HCT: 36.1 % (ref 36.0–46.0)
Hemoglobin: 12.2 g/dL (ref 12.0–15.0)
MCH: 32.7 pg (ref 26.0–34.0)
MCHC: 33.8 g/dL (ref 30.0–36.0)
MCV: 96.8 fL (ref 80.0–100.0)
Platelets: 211 10*3/uL (ref 150–400)
RBC: 3.73 MIL/uL — ABNORMAL LOW (ref 3.87–5.11)
RDW: 13.6 % (ref 11.5–15.5)
WBC: 9.6 10*3/uL (ref 4.0–10.5)
nRBC: 0 % (ref 0.0–0.2)

## 2021-07-24 LAB — BASIC METABOLIC PANEL
Anion gap: 6 (ref 5–15)
BUN: 8 mg/dL (ref 8–23)
CO2: 27 mmol/L (ref 22–32)
Calcium: 8.6 mg/dL — ABNORMAL LOW (ref 8.9–10.3)
Chloride: 100 mmol/L (ref 98–111)
Creatinine, Ser: 0.67 mg/dL (ref 0.44–1.00)
GFR, Estimated: >60 mL/min (ref >60)
Glucose, Bld: 168 mg/dL — ABNORMAL HIGH (ref 70–99)
Potassium: 4.6 mmol/L (ref 3.5–5.1)
Sodium: 133 mmol/L — ABNORMAL LOW (ref 135–145)

## 2021-07-24 MED ORDER — GABAPENTIN 300 MG PO CAPS: ORAL_CAPSULE | ORAL | 0 refills | Status: DC

## 2021-07-24 MED ORDER — SODIUM CHLORIDE 0.9 % IV BOLUS
250.0000 mL | Freq: Once | INTRAVENOUS | Status: AC
  Administered 2021-07-24: 250 mL via INTRAVENOUS

## 2021-07-24 MED ORDER — OXYCODONE HCL 5 MG PO TABS: 5.0000 mg | ORAL_TABLET | Freq: Four times a day (QID) | ORAL | 0 refills | Status: DC | PRN

## 2021-07-24 MED ORDER — RIVAROXABAN 10 MG PO TABS: 10.0000 mg | ORAL_TABLET | Freq: Every day | ORAL | 0 refills | Status: DC

## 2021-07-24 MED ORDER — METHOCARBAMOL 500 MG PO TABS: 500.0000 mg | ORAL_TABLET | Freq: Four times a day (QID) | ORAL | 0 refills | Status: DC | PRN

## 2021-07-24 NOTE — Progress Notes (Signed)
Subjective: 1 Day Post-Op Procedure(s) (LRB): TOTAL KNEE ARTHROPLASTY (Left) Patient reports pain as mild.   Patient seen in rounds by Dr. Wynelle Link. Patient is well, and has had no acute complaints or problems other than pain in the left knee. Denies chest pain or SOB. No issues overnight.  We will continue therapy today, ambulated 92' yesterday.   Objective: Vital signs in last 24 hours: Temp:  [97.5 F (36.4 C)-98.2 F (36.8 C)] 97.9 F (36.6 C) (10/04 0616) Pulse Rate:  [64-79] 70 (10/04 0616) Resp:  [4-23] 18 (10/04 0616) BP: (105-140)/(54-96) 105/73 (10/04 0616) SpO2:  [97 %-100 %] 99 % (10/04 0616)  Intake/Output from previous day:  Intake/Output Summary (Last 24 hours) at 07/24/2021 0809 Last data filed at 07/24/2021 0600 Gross per 24 hour  Intake 1160.31 ml  Output 2300 ml  Net -1139.69 ml     Intake/Output this shift: No intake/output data recorded.  Labs: Recent Labs    07/23/21 1120 07/24/21 0335  HGB 11.3* 12.2   Recent Labs    07/23/21 1120 07/24/21 0335  WBC 6.8 9.6  RBC 3.53* 3.73*  HCT 34.2* 36.1  PLT 197 211   Recent Labs    07/24/21 0335  NA 133*  K 4.6  CL 100  CO2 27  BUN 8  CREATININE 0.67  GLUCOSE 168*  CALCIUM 8.6*   Recent Labs    07/23/21 1120  INR 1.0    Exam: General - Patient is Alert and Oriented Extremity - Neurologically intact Neurovascular intact Sensation intact distally Dorsiflexion/Plantar flexion intact Dressing - dressing C/D/I Motor Function - intact, moving foot and toes well on exam.   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   Weight as of this encounter: 79.2 kg. Advance diet Up with therapy   Patient's anticipated LOS is less than 2 midnights, meeting these requirements: - Lives within 1 hour of care - Has a competent adult at home to recover with post-op recover - NO history of  - Chronic pain requiring opiods  - Diabetes  - Coronary Artery Disease  - Heart failure  - Heart attack  - Stroke  - DVT/VTE  - Cardiac arrhythmia  - Respiratory Failure/COPD  - Anemia  - Advanced Liver disease  DVT Prophylaxis - Xarelto Weight bearing as tolerated. Continue therapy.  BP soft this AM and I/O in the negatives. 250 mL bolus ordered.  Plan is to go Home after hospital stay. Plan for discharge later today if  progresses with therapy and meeting her goals. Scheduled for OPPT at [facility] PT Follow-up in the office in 2 weeks  The [place] was reviewed today prior to any opioid medications being prescribed to this patient.  Theresa Duty, PA-C Orthopedic Surgery [phone] 07/24/2021, 8:09 AM

## 2021-07-24 NOTE — TOC Transition Note (Signed)
Transition of Care [facility]) - CM/SW Discharge Note   Patient Details  Name: Morgan Elliott MRN: [id number] Date of Birth: 04-07-1946  [facility]) CM/SW Contact:  Lennart Pall, LCSW Phone Number: 07/24/2021, 1:53 PM   Clinical Narrative:    Pt part of ortho bundle.  Confirming plan for OP at [facility] PT and she has all needed DME.   Final next level of care: OP Rehab Barriers to Discharge: No Barriers Identified   Patient Goals and CMS Choice Patient states their goals for this hospitalization and ongoing recovery are:: return home      Discharge Placement                       Discharge Plan and Services                DME Arranged: N/A DME Agency: NA       HH Arranged: NA HH Agency: NA        Social Determinants of Health (SDOH) Interventions     Readmission Risk Interventions No flowsheet data found.

## 2021-07-24 NOTE — Progress Notes (Signed)
Physical Therapy Treatment Patient Details Name: Morgan Elliott MRN: [id number] DOB: 05/29/1946 Today's Date: 07/24/2021   History of Present Illness 75 yo female s/p L TKA. PMH: breast CA, cervical fusion, HTN, L THA    PT Comments    Pt progressing well today. Will see for a second session for stair training and education with significant other. Pt should be ready to d/c later today with family assist as needed    Recommendations for follow up therapy are one component of a multi-disciplinary discharge planning process, led by the attending physician.  Recommendations may be updated based on patient status, additional functional criteria and insurance authorization.  Follow Up Recommendations  Follow surgeon's recommendation for DC plan and follow-up therapies     Equipment Recommendations  None recommended by PT    Recommendations for Other Services       Precautions / Restrictions Precautions Precautions: Fall;Knee Precaution Comments: IND SLRs Required Braces or Orthoses: Knee Immobilizer - Left Knee Immobilizer - Left: Discontinue once straight leg raise with < 10 degree lag Restrictions Weight Bearing Restrictions: No Other Position/Activity Restrictions: WBAT     Mobility  Bed Mobility Overal bed mobility: Needs Assistance Bed Mobility: Supine to Sit     Supine to sit: Supervision     General bed mobility comments: for safety    Transfers Overall transfer level: Needs assistance Equipment used: Rolling walker (2 wheeled) Transfers: Sit to/from Stand Sit to Stand: Min guard         General transfer comment: cues for hand placement and LLE position  Ambulation/Gait Ambulation/Gait assistance: Min guard Gait Distance (Feet): 85 Feet Assistive device: Rolling walker (2 wheeled) Gait Pattern/deviations: Step-to pattern;Decreased stance time - left     General Gait Details: cues for sequence and distance from [employer] Mobility    Modified Rankin (Stroke Patients Only)       Balance                                            Cognition Arousal/Alertness: Awake/alert Behavior During Therapy: WFL for tasks assessed/performed Overall Cognitive Status: Within Functional Limits for tasks assessed                                        Exercises Total Joint Exercises Ankle Circles/Pumps: AROM;Both;10 reps Quad Sets: AROM;Both;10 reps Heel Slides: AAROM;Left;10 reps Straight Leg Raises: AROM;Left;10 reps Knee Flexion: AAROM;AROM;Left;5 reps;Seated Goniometric ROM: grossly 6 to 65 degrees flexion    General Comments        Pertinent Vitals/Pain Pain Assessment: 0-10 Pain Score: 5  Pain Location: L knee Pain Descriptors / Indicators: Discomfort;Sore Pain Intervention(s): Limited activity within patient's tolerance;Monitored during session;Premedicated before session;Repositioned    Home Living                      Prior Function            PT Goals (current goals can now be found in the care plan section) Acute Rehab PT Goals Patient Stated Goal: less knee pain PT Goal Formulation: With patient Time For Goal Achievement: 07/30/21 Potential to Achieve Goals: Good Progress towards PT goals: Progressing toward goals  Frequency    7X/week      PT Plan Current plan remains appropriate    Co-evaluation              AM-PAC PT "6 Clicks" Mobility   Outcome Measure  Help needed turning from your back to your side while in a flat bed without using bedrails?: A Little Help needed moving from lying on your back to sitting on the side of a flat bed without using bedrails?: A Little Help needed moving to and from a bed to a chair (including a wheelchair)?: A Little Help needed standing up from a chair using your arms (e.g., wheelchair or bedside chair)?: A Little Help needed to walk in hospital room?: A Little Help  needed climbing 3-5 steps with a railing? : A Little 6 Click Score: 18    End of Session Equipment Utilized During Treatment: Gait belt Activity Tolerance: Patient tolerated treatment well Patient left: with call bell/phone within reach;with chair alarm set;in chair Nurse Communication: Mobility status PT Visit Diagnosis: Other abnormalities of gait and mobility (R26.89);Difficulty in walking, not elsewhere classified (R26.2)     Time: 9024-0973 PT Time Calculation (min) (ACUTE ONLY): 34 min  Charges:  $Gait Training: 8-22 mins $Therapeutic Exercise: 8-22 mins                     Baxter Flattery, PT  Acute Rehab Dept ([place]) [phone] Pager [phone]  07/24/2021     [facility] 07/24/2021, 10:24 AM

## 2021-07-24 NOTE — Progress Notes (Addendum)
07/24/21 1300  PT Visit Information  Last PT Received On 07/24/21  Assistance Needed +1  History of Present Illness 75 yo female s/p L TKA. PMH: breast CA, cervical fusion, HTN, L THA  Pt ready to d/c with family assist from PT standpoint   Subjective Data  Patient Stated Goal less knee pain  Precautions  Precautions Fall;Knee  Precaution Comments IND SLRs  Required Braces or Orthoses Knee Immobilizer - Left  Knee Immobilizer - Left Discontinue once straight leg raise with < 10 degree lag  Restrictions  Weight Bearing Restrictions No  Other Position/Activity Restrictions WBAT  Pain Assessment  Pain Assessment 0-10  Pain Score 4  Pain Location L knee  Pain Descriptors / Indicators Discomfort;Sore  Pain Intervention(s) Limited activity within patient's tolerance;Monitored during session;Premedicated before session;Repositioned  Cognition  Arousal/Alertness Awake/alert  Behavior During Therapy WFL for tasks assessed/performed  Overall Cognitive Status Within Functional Limits for tasks assessed  Bed Mobility  Overal bed mobility Needs Assistance  Bed Mobility Sit to Supine  Sit to supine Supervision;Modified independent (Device/Increase time)  General bed mobility comments for safety; reviewed assist with husband if needed  Transfers  Overall transfer level Needs assistance  Equipment used Rolling walker (2 wheeled)  Transfers Sit to/from Stand  Sit to Stand Supervision  General transfer comment cues for hand placement and LLE position  Ambulation/Gait  Ambulation/Gait assistance Min guard  Gait Distance (Feet) 45 Feet  Assistive device Rolling walker (2 wheeled)  Gait Pattern/deviations Step-to pattern;Decreased stance time - left  General Gait Details cues for sequence and distance from RW  Stairs Yes  Stairs assistance Min guard;Min assist  Stair Management One rail Right;One rail Left;Step to pattern;Sideways  Number of Stairs 5  General stair comments cues for  sequence and technique; repeated x2, pt husband present for stair training   PT - End of Session  Equipment Utilized During Treatment Gait belt  Activity Tolerance Patient tolerated treatment well  Patient left with call bell/phone within reach;with chair alarm set;in chair  Nurse Communication Mobility status   PT - Assessment/Plan  PT Plan Current plan remains appropriate  PT Visit Diagnosis Other abnormalities of gait and mobility (R26.89);Difficulty in walking, not elsewhere classified (R26.2)  PT Frequency (ACUTE ONLY) 7X/week  Follow Up Recommendations Follow surgeon's recommendation for DC plan and follow-up therapies  PT equipment None recommended by PT  AM-PAC PT "6 Clicks" Mobility Outcome Measure (Version 2)  Help needed turning from your back to your side while in a flat bed without using bedrails? 3  Help needed moving from lying on your back to sitting on the side of a flat bed without using bedrails? 3  Help needed moving to and from a bed to a chair (including a wheelchair)? 3  Help needed standing up from a chair using your arms (e.g., wheelchair or bedside chair)? 3  Help needed to walk in hospital room? 3  Help needed climbing 3-5 steps with a railing?  3  6 Click Score 18  Consider Recommendation of Discharge To: Home with [facility]  PT Goal Progression  Progress towards PT goals Progressing toward goals  Acute Rehab PT Goals  PT Goal Formulation With patient  Time For Goal Achievement 07/30/21  Potential to Achieve Goals Good  PT Time Calculation  PT Start Time (ACUTE ONLY) 1229  PT Stop Time (ACUTE ONLY) 1247  PT Time Calculation (min) (ACUTE ONLY) 18 min  PT General Charges  $$ ACUTE PT VISIT 1 Visit  PT Treatments  $Gait Training 8-22 mins

## 2021-07-25 NOTE — Anesthesia Postprocedure Evaluation (Signed)
Anesthesia Post Note  Patient: Morgan Elliott  Procedure(s) Performed: TOTAL KNEE ARTHROPLASTY (Left: Knee)     Patient location during evaluation: PACU Anesthesia Type: Spinal Level of consciousness: oriented and awake and alert Pain management: pain level controlled Vital Signs Assessment: post-procedure vital signs reviewed and stable Respiratory status: spontaneous breathing, respiratory function stable and patient connected to nasal cannula oxygen Cardiovascular status: blood pressure returned to baseline and stable Postop Assessment: no headache, no backache and no apparent nausea or vomiting Anesthetic complications: no   No notable events documented.  Last Vitals:  Vitals:   07/24/21 0616 07/24/21 0953  BP: 105/73 124/81  Pulse: 70 69  Resp: 18 18  Temp: 36.6 C   SpO2: 99% 98%    Last Pain:  Vitals:   07/24/21 1221  TempSrc:   PainSc: 5                  Rosmary Dionisio S

## 2021-07-27 DIAGNOSIS — Z4789 Encounter for other orthopedic aftercare: Secondary | ICD-10-CM | POA: Diagnosis not present

## 2021-07-30 DIAGNOSIS — Z4789 Encounter for other orthopedic aftercare: Secondary | ICD-10-CM | POA: Diagnosis not present

## 2021-08-01 DIAGNOSIS — Z4789 Encounter for other orthopedic aftercare: Secondary | ICD-10-CM | POA: Diagnosis not present

## 2021-08-01 NOTE — Discharge Summary (Signed)
Physician Discharge Summary   Patient ID: Morgan Elliott MRN: [id number] DOB/AGE: 12/11/1945 75 y.o.  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   Weight as of this encounter: 79.2 kg.  Procedure:  Procedure(s) (LRB): TOTAL KNEE ARTHROPLASTY (Left)   Consults: None  HPI: Morgan Elliott is a 75 y.o. year old female with end stage OA of her left knee with progressively worsening pain and dysfunction. She has constant pain, with activity and at rest and significant functional deficits with difficulties even with ADLs. She has had extensive non-op management including analgesics, injections of cortisone and viscosupplements, and home exercise program, but remains in significant pain with significant dysfunction. Radiographs show bone on bone arthritis lateral and patellofemoral. She presents now for left Total Knee Arthroplasty.  Laboratory Data: Admission on 07/23/2021, Discharged on 07/24/2021  Component Date Value Ref Range Status   WBC 07/23/2021 6.8  4.0 - 10.5 K/uL Final   RBC  07/23/2021 3.53 (A) 3.87 - 5.11 MIL/uL Final   Hemoglobin 07/23/2021 11.3 (A) 12.0 - 15.0 g/dL Final   HCT 07/23/2021 34.2 (A) 36.0 - 46.0 % Final   MCV 07/23/2021 96.9  80.0 - 100.0 fL Final   MCH 07/23/2021 32.0  26.0 - 34.0 pg Final   MCHC 07/23/2021 33.0  30.0 - 36.0 g/dL Final   RDW 07/23/2021 14.0  11.5 - 15.5 % Final   Platelets 07/23/2021 197  150 - 400 K/uL Final   nRBC 07/23/2021 0.0  0.0 - 0.2 % Final   Performed at [facility], [place] [address] [place], [place] [address]   Prothrombin Time 07/23/2021 12.9  11.4 - 15.2 seconds Final   INR 07/23/2021 1.0  0.8 - 1.2 Final   Comment: (NOTE) INR goal varies based on device and disease states. Performed at [facility], [place] [address] [place], [place] [address]    WBC 07/24/2021 9.6  4.0 - 10.5 K/uL Final   RBC 07/24/2021 3.73 (A) 3.87 - 5.11 MIL/uL Final   Hemoglobin 07/24/2021 12.2  12.0 - 15.0 g/dL Final   HCT 07/24/2021 36.1  36.0 - 46.0 % Final   MCV 07/24/2021 96.8  80.0 - 100.0 fL Final   MCH 07/24/2021 32.7  26.0 - 34.0 pg Final   MCHC 07/24/2021 33.8  30.0 - 36.0 g/dL Final   RDW 07/24/2021 13.6  11.5 - 15.5 % Final   Platelets 07/24/2021 211  150 - 400 K/uL Final   nRBC 07/24/2021 0.0  0.0 - 0.2 % Final   Performed at [facility], [place] [address] [place], [place] [address]   Sodium 07/24/2021 133 (A) 135 - 145 mmol/L Final   Potassium 07/24/2021 4.6  3.5 - 5.1 mmol/L Final   Chloride 07/24/2021 100  98 - 111 mmol/L Final   CO2 07/24/2021 27  22 - 32 mmol/L Final   Glucose, Bld 07/24/2021 168 (A) 70 - 99 mg/dL Final   Glucose reference range applies only to samples taken after fasting for at least 8 hours.   BUN 07/24/2021 8  8 - 23 mg/dL Final   Creatinine, Ser 07/24/2021 0.67  0.44 - 1.00 mg/dL Final   Calcium 07/24/2021 8.6 (A) 8.9 - 10.3 mg/dL Final   GFR, Estimated 07/24/2021 >60  >60 mL/min Final   Comment: (NOTE) Calculated using the CKD-EPI  Creatinine Equation (2021)    Anion gap 07/24/2021 6  5 - 15 Final   Performed at [facility], [place] [address], [place], [place] [address]  Orders Only on 07/19/2021  Component Date Value Ref Range Status   SARS Coronavirus 2 07/19/2021 RESULT: NEGATIVE   Final   Comment: RESULT: NEGATIVESARS-CoV-2 INTERPRETATION:A NEGATIVE  test result means that SARS-CoV-2 RNA was not present in the specimen above the limit of detection of this test. This does not preclude a possible SARS-CoV-2 infection and should not be used as the  sole basis for patient management decisions. Negative results must be combined with clinical observations, patient history, and epidemiological information. Optimum specimen types and timing for peak viral levels during infections caused by SARS-CoV-2  have not been determined. Collection of multiple specimens or types of specimens may be necessary to detect virus. Improper specimen collection and handling, sequence variability under primers/probes, or organism present below the limit of detection may  lead to false negative results. Positive and negative predictive values of testing are highly dependent on prevalence. False negative test results are more likely when prevalence of disease is high.The expected result is NEGATIVE.Fact S                          heet for  Healthcare Providers: LocalChronicle.no Sheet for Patients: SalonLookup.es Reference Range - Negative   Hospital Outpatient Visit on 07/10/2021  Component Date Value Ref Range Status   MRSA, PCR 07/10/2021 NEGATIVE  NEGATIVE Final   Staphylococcus aureus 07/10/2021 NEGATIVE  NEGATIVE Final   Comment: (NOTE) The Xpert SA Assay (FDA approved for NASAL specimens in patients 36 years of age and older), is one component of a comprehensive surveillance program. It is not intended to diagnose infection nor to guide or monitor  treatment. Performed at [facility], [place] [address], [place], [place] [address]    Sodium 07/10/2021 136  135 - 145 mmol/L Final   Potassium 07/10/2021 4.5  3.5 - 5.1 mmol/L Final   Chloride 07/10/2021 101  98 - 111 mmol/L Final   CO2 07/10/2021 27  22 - 32 mmol/L Final   Glucose, Bld 07/10/2021 95  70 - 99 mg/dL Final   Glucose reference range applies only to samples taken after fasting for at least 8 hours.   BUN 07/10/2021 14  8 - 23 mg/dL Final   Creatinine, Ser 07/10/2021 0.65  0.44 - 1.00 mg/dL Final   Calcium 07/10/2021 9.8  8.9 - 10.3 mg/dL Final   Total Protein 07/10/2021  7.6  6.5 - 8.1 g/dL Final   Albumin 07/10/2021 3.8  3.5 - 5.0 g/dL Final   AST 07/10/2021 22  15 - 41 U/L Final   ALT 07/10/2021 16  0 - 44 U/L Final   Alkaline Phosphatase 07/10/2021 57  38 - 126 U/L Final   Total Bilirubin 07/10/2021 0.6  0.3 - 1.2 mg/dL Final   GFR, Estimated 07/10/2021 >60  >60 mL/min Final   Comment: (NOTE) Calculated using the CKD-EPI Creatinine Equation (2021)    Anion gap 07/10/2021 8  5 - 15 Final   Performed at [facility], [place] [address] [place], [place] [address]   ABO/RH(D) 07/10/2021 A POS   Final   Antibody Screen 07/10/2021 NEG   Final   Sample Expiration 07/10/2021 07/26/2021,2359   Final   Extend sample reason 07/10/2021    Final                   Value:NO TRANSFUSIONS OR PREGNANCY IN THE PAST 3 MONTHS Performed at [place] [address], [place], [place] [address]      X-Rays:No results found.  EKG: Orders placed or performed during the hospital encounter of 07/10/21   EKG 12 lead per protocol   EKG 12 lead per protocol     Hospital Course: Morgan Elliott is a 75 y.o. who was admitted to [facility]. They were brought to the operating room on 07/23/2021 and underwent Procedure(s): TOTAL KNEE ARTHROPLASTY.  Patient tolerated the procedure well and was later transferred to the recovery room  and then to the orthopaedic floor for postoperative care. They were given PO and IV analgesics for pain control following their surgery. They were given 24 hours of postoperative antibiotics of  Anti-infectives (From admission, onward)    Start     Dose/Rate Route Frequency Ordered Stop   07/23/21 1530  ceFAZolin (ANCEF) IVPB 2g/100 mL premix        2 g 200 mL/hr over 30 Minutes Intravenous Every 6 hours 07/23/21 1233 07/23/21 2235   07/23/21 0700  ceFAZolin (ANCEF) IVPB 2g/100 mL premix        2 g 200 mL/hr over 30 Minutes Intravenous On call to O.R. 07/23/21 7939 07/23/21 0933      and started on DVT prophylaxis in the form of Xarelto.   PT and OT were ordered for total joint protocol. Discharge planning consulted to help with postop disposition and equipment needs.  Patient had a good night on the evening of surgery. They started to get up OOB with therapy on POD #0. Pt was seen during rounds and was ready to go home pending progress with therapy. She worked with therapy on POD #1 and was meeting her goals. Pt was discharged to home later that day in stable condition.  Diet: Regular diet Activity: WBAT Follow-up: in 2 weeks Disposition: Home Discharged Condition: stable   Discharge Instructions     Call MD / Call 911   Complete by: As directed    If you experience chest pain or shortness of breath, CALL 911 and be transported to the hospital emergency room.  If you develope a fever above 101 F, pus (Harman drainage) or increased drainage or redness at the wound, or calf pain, call your surgeon's office.   Change dressing   Complete by: As directed    You may remove the bulky bandage (ACE wrap and gauze) two days after surgery. You will have an  adhesive waterproof bandage underneath. Leave this in place until your first follow-up appointment.   Constipation Prevention   Complete by: As directed    Drink plenty of fluids.  Prune juice may be helpful.  You may use a stool softener, such  as Colace (over the counter) 100 mg twice a day.  Use MiraLax (over the counter) for constipation as needed.   Diet - low sodium heart healthy   Complete by: As directed    Do not put a pillow under the knee. Place it under the heel.   Complete by: As directed    Driving restrictions   Complete by: As directed    No driving for two weeks   Post-operative opioid taper instructions:   Complete by: As directed    POST-OPERATIVE OPIOID TAPER INSTRUCTIONS: It is important to wean off of your opioid medication as soon as possible. If you do not need pain medication after your surgery it is ok to stop day one. Opioids include: Codeine, Hydrocodone(Norco, Vicodin), Oxycodone(Percocet, oxycontin) and hydromorphone amongst others.  Long term and even short term use of opiods can cause: Increased pain response Dependence Constipation Depression Respiratory depression And more.  Withdrawal symptoms can include Flu like symptoms Nausea, vomiting And more Techniques to manage these symptoms Hydrate well Eat regular healthy meals Stay active Use relaxation techniques(deep breathing, meditating, yoga) Do Not substitute Alcohol to help with tapering If you have been on opioids for less than two weeks and do not have pain than it is ok to stop all together.  Plan to wean off of opioids This plan should start within one week post op of your joint replacement. Maintain the same interval or time between taking each dose and first decrease the dose.  Cut the total daily intake of opioids by one tablet each day Next start to increase the time between doses. The last dose that should be eliminated is the evening dose.      TED hose   Complete by: As directed    Use stockings (TED hose) for three weeks on both leg(s).  You may remove them at night for sleeping.   Weight bearing as tolerated   Complete by: As directed       Allergies as of 07/24/2021       Reactions   Nickel          Medication List     STOP taking these medications    aspirin EC 81 MG tablet   calcium citrate-vitamin D 500-400 MG-UNIT chewable tablet   cholecalciferol 25 MCG (1000 UNIT) tablet Commonly known as: VITAMIN D3   multivitamin with minerals tablet       TAKE these medications    alendronate 70 MG tablet Commonly known as: FOSAMAX Take 70 mg by mouth every Thursday.   Align 4 MG Caps Take 4 mg by mouth daily.   amLODipine 5 MG tablet Commonly known as: NORVASC Take 5 mg by mouth every evening.   cetirizine 10 MG tablet Commonly known as: ZYRTEC Take 10 mg by mouth at bedtime.   citalopram 20 MG tablet Commonly known as: CELEXA Take 1 tablet (20 mg total) by mouth daily.   clindamycin 1 % lotion Commonly known as: CLEOCIN T Apply 1 application topically daily as needed (acne).   fluticasone 50 MCG/ACT nasal spray Commonly known as: FLONASE Place 1 spray into both nostrils daily as needed for allergies. Reported on 04/09/2016   gabapentin 300 MG capsule Commonly known as:  NEURONTIN Take a 300 mg capsule three times a day for two weeks following surgery.Then take a 300 mg capsule two times a day for two weeks. Then take a 300 mg capsule once a day for two weeks. Then discontinue.   guaifenesin 400 MG Tabs tablet Commonly known as: HUMIBID E Take 400 mg by mouth at bedtime.   losartan 50 MG tablet Commonly known as: COZAAR Take 25 mg by mouth every evening.   methocarbamol 500 MG tablet Commonly known as: ROBAXIN Take 1 tablet (500 mg total) by mouth every 6 (six) hours as needed for muscle spasms.   OVER THE COUNTER MEDICATION Take 25 mg by mouth at bedtime. CBD Oil   oxyCODONE 5 MG immediate release tablet Commonly known as: Oxy IR/ROXICODONE Take 1-2 tablets (5-10 mg total) by mouth every 6 (six) hours as needed for moderate pain or severe pain (pain score 4-6).   rivaroxaban 10 MG Tabs tablet Commonly known as: XARELTO Take 1 tablet (10 mg total)  by mouth daily with breakfast for 20 days. Then resume one 81 mg aspirin once a day.   timolol 0.25 % ophthalmic solution Commonly known as: BETIMOL Place 1 drop into both eyes every morning.   zaleplon 5 MG capsule Commonly known as: SONATA TAKE 1 CAPSULE AT BEDTIME AS NEEDED               Discharge Care Instructions  (From admission, onward)           Start     Ordered   07/24/21 0000  Weight bearing as tolerated        07/24/21 0814   07/24/21 0000  Change dressing       Comments: You may remove the bulky bandage (ACE wrap and gauze) two days after surgery. You will have an adhesive waterproof bandage underneath. Leave this in place until your first follow-up appointment.   07/24/21 0940            Follow-up Information     Gaynelle Arabian, MD. Go on 08/07/2021.   Specialty: Orthopedic Surgery Why: You are scheduled for a follow up appointment on 08-07-21 at 3:30 pm. Contact information: [address] [place] [address] [phone]                 Signed: Theresa Duty, PA-C Orthopedic Surgery 08/01/2021, 12:32 PM

## 2021-08-06 DIAGNOSIS — Z4789 Encounter for other orthopedic aftercare: Secondary | ICD-10-CM | POA: Diagnosis not present

## 2021-08-09 DIAGNOSIS — Z4789 Encounter for other orthopedic aftercare: Secondary | ICD-10-CM | POA: Diagnosis not present

## 2021-08-13 DIAGNOSIS — Z4789 Encounter for other orthopedic aftercare: Secondary | ICD-10-CM | POA: Diagnosis not present

## 2021-08-15 DIAGNOSIS — Z4789 Encounter for other orthopedic aftercare: Secondary | ICD-10-CM | POA: Diagnosis not present

## 2021-08-20 DIAGNOSIS — Z4789 Encounter for other orthopedic aftercare: Secondary | ICD-10-CM | POA: Diagnosis not present

## 2021-08-23 DIAGNOSIS — Z4789 Encounter for other orthopedic aftercare: Secondary | ICD-10-CM | POA: Diagnosis not present

## 2021-08-24 DIAGNOSIS — Z23 Encounter for immunization: Secondary | ICD-10-CM | POA: Diagnosis not present

## 2021-08-28 DIAGNOSIS — Z4789 Encounter for other orthopedic aftercare: Secondary | ICD-10-CM | POA: Diagnosis not present

## 2021-08-30 DIAGNOSIS — Z4789 Encounter for other orthopedic aftercare: Secondary | ICD-10-CM | POA: Diagnosis not present

## 2021-09-03 DIAGNOSIS — Z4789 Encounter for other orthopedic aftercare: Secondary | ICD-10-CM | POA: Diagnosis not present

## 2021-09-04 DIAGNOSIS — Z96652 Presence of left artificial knee joint: Secondary | ICD-10-CM | POA: Diagnosis not present

## 2021-09-04 DIAGNOSIS — Z471 Aftercare following joint replacement surgery: Secondary | ICD-10-CM | POA: Diagnosis not present

## 2021-09-07 DIAGNOSIS — L821 Other seborrheic keratosis: Secondary | ICD-10-CM | POA: Diagnosis not present

## 2021-09-07 DIAGNOSIS — D225 Melanocytic nevi of trunk: Secondary | ICD-10-CM | POA: Diagnosis not present

## 2021-09-07 DIAGNOSIS — D1801 Hemangioma of skin and subcutaneous tissue: Secondary | ICD-10-CM | POA: Diagnosis not present

## 2021-09-07 DIAGNOSIS — Z85828 Personal history of other malignant neoplasm of skin: Secondary | ICD-10-CM | POA: Diagnosis not present

## 2021-09-07 DIAGNOSIS — L820 Inflamed seborrheic keratosis: Secondary | ICD-10-CM | POA: Diagnosis not present

## 2021-09-07 DIAGNOSIS — L700 Acne vulgaris: Secondary | ICD-10-CM | POA: Diagnosis not present

## 2021-09-11 DIAGNOSIS — Z4789 Encounter for other orthopedic aftercare: Secondary | ICD-10-CM | POA: Diagnosis not present

## 2021-09-18 DIAGNOSIS — H524 Presbyopia: Secondary | ICD-10-CM | POA: Diagnosis not present

## 2021-09-18 DIAGNOSIS — H5203 Hypermetropia, bilateral: Secondary | ICD-10-CM | POA: Diagnosis not present

## 2021-09-18 DIAGNOSIS — H40012 Open angle with borderline findings, low risk, left eye: Secondary | ICD-10-CM | POA: Diagnosis not present

## 2021-09-18 DIAGNOSIS — H52223 Regular astigmatism, bilateral: Secondary | ICD-10-CM | POA: Diagnosis not present

## 2021-09-18 DIAGNOSIS — Z4789 Encounter for other orthopedic aftercare: Secondary | ICD-10-CM | POA: Diagnosis not present

## 2021-09-18 DIAGNOSIS — H40011 Open angle with borderline findings, low risk, right eye: Secondary | ICD-10-CM | POA: Diagnosis not present

## 2021-09-25 DIAGNOSIS — K648 Other hemorrhoids: Secondary | ICD-10-CM | POA: Diagnosis not present

## 2021-09-25 DIAGNOSIS — Z1211 Encounter for screening for malignant neoplasm of colon: Secondary | ICD-10-CM | POA: Diagnosis not present

## 2021-10-05 DIAGNOSIS — M1711 Unilateral primary osteoarthritis, right knee: Secondary | ICD-10-CM | POA: Diagnosis not present

## 2021-10-11 DIAGNOSIS — Z471 Aftercare following joint replacement surgery: Secondary | ICD-10-CM | POA: Diagnosis not present

## 2021-10-11 DIAGNOSIS — Z96652 Presence of left artificial knee joint: Secondary | ICD-10-CM | POA: Diagnosis not present

## 2021-10-11 DIAGNOSIS — M1711 Unilateral primary osteoarthritis, right knee: Secondary | ICD-10-CM | POA: Diagnosis not present

## 2021-10-17 ENCOUNTER — Ambulatory Visit
Admission: RE | Admit: 2021-10-17 | Discharge: 2021-10-17 | Disposition: A | Discharge status: Home / Self Care | Payer: Medicare Other | Source: Ambulatory Visit | Attending: Family Medicine | Admitting: Family Medicine

## 2021-10-17 ENCOUNTER — Other Ambulatory Visit: Payer: Self-pay

## 2021-10-17 DIAGNOSIS — M810 Age-related osteoporosis without current pathological fracture: Secondary | ICD-10-CM

## 2021-10-17 DIAGNOSIS — M8589 Other specified disorders of bone density and structure, multiple sites: Secondary | ICD-10-CM | POA: Diagnosis not present

## 2021-10-17 DIAGNOSIS — Z780 Asymptomatic menopausal state: Secondary | ICD-10-CM | POA: Diagnosis not present

## 2021-10-18 DIAGNOSIS — M1711 Unilateral primary osteoarthritis, right knee: Secondary | ICD-10-CM | POA: Diagnosis not present

## 2021-12-31 DIAGNOSIS — H401121 Primary open-angle glaucoma, left eye, mild stage: Secondary | ICD-10-CM | POA: Diagnosis not present

## 2021-12-31 DIAGNOSIS — H401111 Primary open-angle glaucoma, right eye, mild stage: Secondary | ICD-10-CM | POA: Diagnosis not present

## 2021-12-31 DIAGNOSIS — H40012 Open angle with borderline findings, low risk, left eye: Secondary | ICD-10-CM | POA: Diagnosis not present

## 2021-12-31 DIAGNOSIS — H5340 Unspecified visual field defects: Secondary | ICD-10-CM | POA: Diagnosis not present

## 2021-12-31 DIAGNOSIS — H524 Presbyopia: Secondary | ICD-10-CM | POA: Diagnosis not present

## 2021-12-31 DIAGNOSIS — H5203 Hypermetropia, bilateral: Secondary | ICD-10-CM | POA: Diagnosis not present

## 2021-12-31 DIAGNOSIS — H52223 Regular astigmatism, bilateral: Secondary | ICD-10-CM | POA: Diagnosis not present

## 2022-01-18 DIAGNOSIS — M9905 Segmental and somatic dysfunction of pelvic region: Secondary | ICD-10-CM | POA: Diagnosis not present

## 2022-01-18 DIAGNOSIS — S338XXA Sprain of other parts of lumbar spine and pelvis, initial encounter: Secondary | ICD-10-CM | POA: Diagnosis not present

## 2022-01-18 DIAGNOSIS — M5136 Other intervertebral disc degeneration, lumbar region: Secondary | ICD-10-CM | POA: Diagnosis not present

## 2022-01-18 DIAGNOSIS — M9902 Segmental and somatic dysfunction of thoracic region: Secondary | ICD-10-CM | POA: Diagnosis not present

## 2022-01-18 DIAGNOSIS — M5137 Other intervertebral disc degeneration, lumbosacral region: Secondary | ICD-10-CM | POA: Diagnosis not present

## 2022-01-18 DIAGNOSIS — M9903 Segmental and somatic dysfunction of lumbar region: Secondary | ICD-10-CM | POA: Diagnosis not present

## 2022-01-18 DIAGNOSIS — M9904 Segmental and somatic dysfunction of sacral region: Secondary | ICD-10-CM | POA: Diagnosis not present

## 2022-01-18 DIAGNOSIS — S29012A Strain of muscle and tendon of back wall of thorax, initial encounter: Secondary | ICD-10-CM | POA: Diagnosis not present

## 2022-01-21 DIAGNOSIS — M5136 Other intervertebral disc degeneration, lumbar region: Secondary | ICD-10-CM | POA: Diagnosis not present

## 2022-01-21 DIAGNOSIS — S338XXA Sprain of other parts of lumbar spine and pelvis, initial encounter: Secondary | ICD-10-CM | POA: Diagnosis not present

## 2022-01-21 DIAGNOSIS — M9902 Segmental and somatic dysfunction of thoracic region: Secondary | ICD-10-CM | POA: Diagnosis not present

## 2022-01-21 DIAGNOSIS — S29012A Strain of muscle and tendon of back wall of thorax, initial encounter: Secondary | ICD-10-CM | POA: Diagnosis not present

## 2022-01-21 DIAGNOSIS — M9904 Segmental and somatic dysfunction of sacral region: Secondary | ICD-10-CM | POA: Diagnosis not present

## 2022-01-21 DIAGNOSIS — M5137 Other intervertebral disc degeneration, lumbosacral region: Secondary | ICD-10-CM | POA: Diagnosis not present

## 2022-01-21 DIAGNOSIS — M9903 Segmental and somatic dysfunction of lumbar region: Secondary | ICD-10-CM | POA: Diagnosis not present

## 2022-01-21 DIAGNOSIS — M9905 Segmental and somatic dysfunction of pelvic region: Secondary | ICD-10-CM | POA: Diagnosis not present

## 2022-01-24 DIAGNOSIS — M9904 Segmental and somatic dysfunction of sacral region: Secondary | ICD-10-CM | POA: Diagnosis not present

## 2022-01-24 DIAGNOSIS — M5136 Other intervertebral disc degeneration, lumbar region: Secondary | ICD-10-CM | POA: Diagnosis not present

## 2022-01-24 DIAGNOSIS — M9903 Segmental and somatic dysfunction of lumbar region: Secondary | ICD-10-CM | POA: Diagnosis not present

## 2022-01-24 DIAGNOSIS — S338XXA Sprain of other parts of lumbar spine and pelvis, initial encounter: Secondary | ICD-10-CM | POA: Diagnosis not present

## 2022-01-24 DIAGNOSIS — M9905 Segmental and somatic dysfunction of pelvic region: Secondary | ICD-10-CM | POA: Diagnosis not present

## 2022-01-24 DIAGNOSIS — M9902 Segmental and somatic dysfunction of thoracic region: Secondary | ICD-10-CM | POA: Diagnosis not present

## 2022-01-24 DIAGNOSIS — S29012A Strain of muscle and tendon of back wall of thorax, initial encounter: Secondary | ICD-10-CM | POA: Diagnosis not present

## 2022-01-24 DIAGNOSIS — M5137 Other intervertebral disc degeneration, lumbosacral region: Secondary | ICD-10-CM | POA: Diagnosis not present

## 2022-02-11 DIAGNOSIS — M47814 Spondylosis without myelopathy or radiculopathy, thoracic region: Secondary | ICD-10-CM | POA: Diagnosis not present

## 2022-02-11 DIAGNOSIS — M5137 Other intervertebral disc degeneration, lumbosacral region: Secondary | ICD-10-CM | POA: Diagnosis not present

## 2022-02-11 DIAGNOSIS — S338XXA Sprain of other parts of lumbar spine and pelvis, initial encounter: Secondary | ICD-10-CM | POA: Diagnosis not present

## 2022-02-11 DIAGNOSIS — M9905 Segmental and somatic dysfunction of pelvic region: Secondary | ICD-10-CM | POA: Diagnosis not present

## 2022-02-11 DIAGNOSIS — M9903 Segmental and somatic dysfunction of lumbar region: Secondary | ICD-10-CM | POA: Diagnosis not present

## 2022-02-11 DIAGNOSIS — M5136 Other intervertebral disc degeneration, lumbar region: Secondary | ICD-10-CM | POA: Diagnosis not present

## 2022-02-11 DIAGNOSIS — M9904 Segmental and somatic dysfunction of sacral region: Secondary | ICD-10-CM | POA: Diagnosis not present

## 2022-02-11 DIAGNOSIS — M9902 Segmental and somatic dysfunction of thoracic region: Secondary | ICD-10-CM | POA: Diagnosis not present

## 2022-02-14 DIAGNOSIS — M9902 Segmental and somatic dysfunction of thoracic region: Secondary | ICD-10-CM | POA: Diagnosis not present

## 2022-02-14 DIAGNOSIS — M47814 Spondylosis without myelopathy or radiculopathy, thoracic region: Secondary | ICD-10-CM | POA: Diagnosis not present

## 2022-02-14 DIAGNOSIS — M9905 Segmental and somatic dysfunction of pelvic region: Secondary | ICD-10-CM | POA: Diagnosis not present

## 2022-02-14 DIAGNOSIS — M5136 Other intervertebral disc degeneration, lumbar region: Secondary | ICD-10-CM | POA: Diagnosis not present

## 2022-02-14 DIAGNOSIS — M9904 Segmental and somatic dysfunction of sacral region: Secondary | ICD-10-CM | POA: Diagnosis not present

## 2022-02-14 DIAGNOSIS — S338XXA Sprain of other parts of lumbar spine and pelvis, initial encounter: Secondary | ICD-10-CM | POA: Diagnosis not present

## 2022-02-14 DIAGNOSIS — M9903 Segmental and somatic dysfunction of lumbar region: Secondary | ICD-10-CM | POA: Diagnosis not present

## 2022-02-14 DIAGNOSIS — M5137 Other intervertebral disc degeneration, lumbosacral region: Secondary | ICD-10-CM | POA: Diagnosis not present

## 2022-02-18 DIAGNOSIS — M5137 Other intervertebral disc degeneration, lumbosacral region: Secondary | ICD-10-CM | POA: Diagnosis not present

## 2022-02-18 DIAGNOSIS — M47814 Spondylosis without myelopathy or radiculopathy, thoracic region: Secondary | ICD-10-CM | POA: Diagnosis not present

## 2022-02-18 DIAGNOSIS — M9903 Segmental and somatic dysfunction of lumbar region: Secondary | ICD-10-CM | POA: Diagnosis not present

## 2022-02-18 DIAGNOSIS — S338XXA Sprain of other parts of lumbar spine and pelvis, initial encounter: Secondary | ICD-10-CM | POA: Diagnosis not present

## 2022-02-18 DIAGNOSIS — M5136 Other intervertebral disc degeneration, lumbar region: Secondary | ICD-10-CM | POA: Diagnosis not present

## 2022-02-18 DIAGNOSIS — M9904 Segmental and somatic dysfunction of sacral region: Secondary | ICD-10-CM | POA: Diagnosis not present

## 2022-02-18 DIAGNOSIS — M9902 Segmental and somatic dysfunction of thoracic region: Secondary | ICD-10-CM | POA: Diagnosis not present

## 2022-02-18 DIAGNOSIS — M9905 Segmental and somatic dysfunction of pelvic region: Secondary | ICD-10-CM | POA: Diagnosis not present

## 2022-02-20 DIAGNOSIS — S338XXA Sprain of other parts of lumbar spine and pelvis, initial encounter: Secondary | ICD-10-CM | POA: Diagnosis not present

## 2022-02-20 DIAGNOSIS — M9903 Segmental and somatic dysfunction of lumbar region: Secondary | ICD-10-CM | POA: Diagnosis not present

## 2022-02-20 DIAGNOSIS — M5137 Other intervertebral disc degeneration, lumbosacral region: Secondary | ICD-10-CM | POA: Diagnosis not present

## 2022-02-20 DIAGNOSIS — M47814 Spondylosis without myelopathy or radiculopathy, thoracic region: Secondary | ICD-10-CM | POA: Diagnosis not present

## 2022-02-20 DIAGNOSIS — M5136 Other intervertebral disc degeneration, lumbar region: Secondary | ICD-10-CM | POA: Diagnosis not present

## 2022-02-20 DIAGNOSIS — M9905 Segmental and somatic dysfunction of pelvic region: Secondary | ICD-10-CM | POA: Diagnosis not present

## 2022-02-20 DIAGNOSIS — M9904 Segmental and somatic dysfunction of sacral region: Secondary | ICD-10-CM | POA: Diagnosis not present

## 2022-02-20 DIAGNOSIS — M9902 Segmental and somatic dysfunction of thoracic region: Secondary | ICD-10-CM | POA: Diagnosis not present

## 2022-02-25 DIAGNOSIS — M9905 Segmental and somatic dysfunction of pelvic region: Secondary | ICD-10-CM | POA: Diagnosis not present

## 2022-02-25 DIAGNOSIS — S338XXA Sprain of other parts of lumbar spine and pelvis, initial encounter: Secondary | ICD-10-CM | POA: Diagnosis not present

## 2022-02-25 DIAGNOSIS — M9903 Segmental and somatic dysfunction of lumbar region: Secondary | ICD-10-CM | POA: Diagnosis not present

## 2022-02-25 DIAGNOSIS — M9904 Segmental and somatic dysfunction of sacral region: Secondary | ICD-10-CM | POA: Diagnosis not present

## 2022-02-25 DIAGNOSIS — M5136 Other intervertebral disc degeneration, lumbar region: Secondary | ICD-10-CM | POA: Diagnosis not present

## 2022-02-25 DIAGNOSIS — M47814 Spondylosis without myelopathy or radiculopathy, thoracic region: Secondary | ICD-10-CM | POA: Diagnosis not present

## 2022-02-25 DIAGNOSIS — M5137 Other intervertebral disc degeneration, lumbosacral region: Secondary | ICD-10-CM | POA: Diagnosis not present

## 2022-02-25 DIAGNOSIS — M9902 Segmental and somatic dysfunction of thoracic region: Secondary | ICD-10-CM | POA: Diagnosis not present

## 2022-02-27 DIAGNOSIS — M9905 Segmental and somatic dysfunction of pelvic region: Secondary | ICD-10-CM | POA: Diagnosis not present

## 2022-02-27 DIAGNOSIS — S338XXA Sprain of other parts of lumbar spine and pelvis, initial encounter: Secondary | ICD-10-CM | POA: Diagnosis not present

## 2022-02-27 DIAGNOSIS — M47814 Spondylosis without myelopathy or radiculopathy, thoracic region: Secondary | ICD-10-CM | POA: Diagnosis not present

## 2022-02-27 DIAGNOSIS — M9902 Segmental and somatic dysfunction of thoracic region: Secondary | ICD-10-CM | POA: Diagnosis not present

## 2022-02-27 DIAGNOSIS — M9903 Segmental and somatic dysfunction of lumbar region: Secondary | ICD-10-CM | POA: Diagnosis not present

## 2022-02-27 DIAGNOSIS — M5136 Other intervertebral disc degeneration, lumbar region: Secondary | ICD-10-CM | POA: Diagnosis not present

## 2022-02-27 DIAGNOSIS — M9904 Segmental and somatic dysfunction of sacral region: Secondary | ICD-10-CM | POA: Diagnosis not present

## 2022-02-27 DIAGNOSIS — M5137 Other intervertebral disc degeneration, lumbosacral region: Secondary | ICD-10-CM | POA: Diagnosis not present

## 2022-03-04 DIAGNOSIS — S338XXA Sprain of other parts of lumbar spine and pelvis, initial encounter: Secondary | ICD-10-CM | POA: Diagnosis not present

## 2022-03-04 DIAGNOSIS — M9902 Segmental and somatic dysfunction of thoracic region: Secondary | ICD-10-CM | POA: Diagnosis not present

## 2022-03-04 DIAGNOSIS — M9904 Segmental and somatic dysfunction of sacral region: Secondary | ICD-10-CM | POA: Diagnosis not present

## 2022-03-04 DIAGNOSIS — M5137 Other intervertebral disc degeneration, lumbosacral region: Secondary | ICD-10-CM | POA: Diagnosis not present

## 2022-03-04 DIAGNOSIS — M9905 Segmental and somatic dysfunction of pelvic region: Secondary | ICD-10-CM | POA: Diagnosis not present

## 2022-03-04 DIAGNOSIS — M47814 Spondylosis without myelopathy or radiculopathy, thoracic region: Secondary | ICD-10-CM | POA: Diagnosis not present

## 2022-03-04 DIAGNOSIS — M9903 Segmental and somatic dysfunction of lumbar region: Secondary | ICD-10-CM | POA: Diagnosis not present

## 2022-03-04 DIAGNOSIS — M5136 Other intervertebral disc degeneration, lumbar region: Secondary | ICD-10-CM | POA: Diagnosis not present

## 2022-03-11 DIAGNOSIS — M9904 Segmental and somatic dysfunction of sacral region: Secondary | ICD-10-CM | POA: Diagnosis not present

## 2022-03-11 DIAGNOSIS — M47814 Spondylosis without myelopathy or radiculopathy, thoracic region: Secondary | ICD-10-CM | POA: Diagnosis not present

## 2022-03-11 DIAGNOSIS — M5136 Other intervertebral disc degeneration, lumbar region: Secondary | ICD-10-CM | POA: Diagnosis not present

## 2022-03-11 DIAGNOSIS — S338XXA Sprain of other parts of lumbar spine and pelvis, initial encounter: Secondary | ICD-10-CM | POA: Diagnosis not present

## 2022-03-11 DIAGNOSIS — M9903 Segmental and somatic dysfunction of lumbar region: Secondary | ICD-10-CM | POA: Diagnosis not present

## 2022-03-11 DIAGNOSIS — M5137 Other intervertebral disc degeneration, lumbosacral region: Secondary | ICD-10-CM | POA: Diagnosis not present

## 2022-03-11 DIAGNOSIS — M9905 Segmental and somatic dysfunction of pelvic region: Secondary | ICD-10-CM | POA: Diagnosis not present

## 2022-03-11 DIAGNOSIS — M9902 Segmental and somatic dysfunction of thoracic region: Secondary | ICD-10-CM | POA: Diagnosis not present

## 2022-03-14 DIAGNOSIS — M7062 Trochanteric bursitis, left hip: Secondary | ICD-10-CM | POA: Diagnosis not present

## 2022-03-14 DIAGNOSIS — Z96652 Presence of left artificial knee joint: Secondary | ICD-10-CM | POA: Diagnosis not present

## 2022-03-14 DIAGNOSIS — Z96642 Presence of left artificial hip joint: Secondary | ICD-10-CM | POA: Diagnosis not present

## 2022-03-19 DIAGNOSIS — M47814 Spondylosis without myelopathy or radiculopathy, thoracic region: Secondary | ICD-10-CM | POA: Diagnosis not present

## 2022-03-19 DIAGNOSIS — M9902 Segmental and somatic dysfunction of thoracic region: Secondary | ICD-10-CM | POA: Diagnosis not present

## 2022-03-19 DIAGNOSIS — M9905 Segmental and somatic dysfunction of pelvic region: Secondary | ICD-10-CM | POA: Diagnosis not present

## 2022-03-19 DIAGNOSIS — M5137 Other intervertebral disc degeneration, lumbosacral region: Secondary | ICD-10-CM | POA: Diagnosis not present

## 2022-03-19 DIAGNOSIS — M9904 Segmental and somatic dysfunction of sacral region: Secondary | ICD-10-CM | POA: Diagnosis not present

## 2022-03-19 DIAGNOSIS — S338XXA Sprain of other parts of lumbar spine and pelvis, initial encounter: Secondary | ICD-10-CM | POA: Diagnosis not present

## 2022-03-19 DIAGNOSIS — M5136 Other intervertebral disc degeneration, lumbar region: Secondary | ICD-10-CM | POA: Diagnosis not present

## 2022-03-19 DIAGNOSIS — M9903 Segmental and somatic dysfunction of lumbar region: Secondary | ICD-10-CM | POA: Diagnosis not present

## 2022-03-25 DIAGNOSIS — M9902 Segmental and somatic dysfunction of thoracic region: Secondary | ICD-10-CM | POA: Diagnosis not present

## 2022-03-25 DIAGNOSIS — M9905 Segmental and somatic dysfunction of pelvic region: Secondary | ICD-10-CM | POA: Diagnosis not present

## 2022-03-25 DIAGNOSIS — M5137 Other intervertebral disc degeneration, lumbosacral region: Secondary | ICD-10-CM | POA: Diagnosis not present

## 2022-03-25 DIAGNOSIS — S338XXA Sprain of other parts of lumbar spine and pelvis, initial encounter: Secondary | ICD-10-CM | POA: Diagnosis not present

## 2022-03-25 DIAGNOSIS — M47814 Spondylosis without myelopathy or radiculopathy, thoracic region: Secondary | ICD-10-CM | POA: Diagnosis not present

## 2022-03-25 DIAGNOSIS — G510 Bell's palsy: Secondary | ICD-10-CM | POA: Diagnosis not present

## 2022-03-25 DIAGNOSIS — M9903 Segmental and somatic dysfunction of lumbar region: Secondary | ICD-10-CM | POA: Diagnosis not present

## 2022-03-25 DIAGNOSIS — M9904 Segmental and somatic dysfunction of sacral region: Secondary | ICD-10-CM | POA: Diagnosis not present

## 2022-03-25 DIAGNOSIS — M5136 Other intervertebral disc degeneration, lumbar region: Secondary | ICD-10-CM | POA: Diagnosis not present

## 2022-04-08 DIAGNOSIS — G510 Bell's palsy: Secondary | ICD-10-CM | POA: Diagnosis not present

## 2022-04-08 DIAGNOSIS — M9905 Segmental and somatic dysfunction of pelvic region: Secondary | ICD-10-CM | POA: Diagnosis not present

## 2022-04-08 DIAGNOSIS — M47814 Spondylosis without myelopathy or radiculopathy, thoracic region: Secondary | ICD-10-CM | POA: Diagnosis not present

## 2022-04-08 DIAGNOSIS — M9902 Segmental and somatic dysfunction of thoracic region: Secondary | ICD-10-CM | POA: Diagnosis not present

## 2022-04-08 DIAGNOSIS — M9904 Segmental and somatic dysfunction of sacral region: Secondary | ICD-10-CM | POA: Diagnosis not present

## 2022-04-08 DIAGNOSIS — S338XXA Sprain of other parts of lumbar spine and pelvis, initial encounter: Secondary | ICD-10-CM | POA: Diagnosis not present

## 2022-04-08 DIAGNOSIS — M5137 Other intervertebral disc degeneration, lumbosacral region: Secondary | ICD-10-CM | POA: Diagnosis not present

## 2022-04-08 DIAGNOSIS — M5136 Other intervertebral disc degeneration, lumbar region: Secondary | ICD-10-CM | POA: Diagnosis not present

## 2022-04-08 DIAGNOSIS — M9903 Segmental and somatic dysfunction of lumbar region: Secondary | ICD-10-CM | POA: Diagnosis not present

## 2022-04-25 DIAGNOSIS — S338XXA Sprain of other parts of lumbar spine and pelvis, initial encounter: Secondary | ICD-10-CM | POA: Diagnosis not present

## 2022-04-25 DIAGNOSIS — M9902 Segmental and somatic dysfunction of thoracic region: Secondary | ICD-10-CM | POA: Diagnosis not present

## 2022-04-25 DIAGNOSIS — M9905 Segmental and somatic dysfunction of pelvic region: Secondary | ICD-10-CM | POA: Diagnosis not present

## 2022-04-25 DIAGNOSIS — M9904 Segmental and somatic dysfunction of sacral region: Secondary | ICD-10-CM | POA: Diagnosis not present

## 2022-04-25 DIAGNOSIS — M5136 Other intervertebral disc degeneration, lumbar region: Secondary | ICD-10-CM | POA: Diagnosis not present

## 2022-04-25 DIAGNOSIS — M5137 Other intervertebral disc degeneration, lumbosacral region: Secondary | ICD-10-CM | POA: Diagnosis not present

## 2022-04-25 DIAGNOSIS — M9903 Segmental and somatic dysfunction of lumbar region: Secondary | ICD-10-CM | POA: Diagnosis not present

## 2022-04-25 DIAGNOSIS — G510 Bell's palsy: Secondary | ICD-10-CM | POA: Diagnosis not present

## 2022-04-25 DIAGNOSIS — M47814 Spondylosis without myelopathy or radiculopathy, thoracic region: Secondary | ICD-10-CM | POA: Diagnosis not present

## 2022-05-02 DIAGNOSIS — K30 Functional dyspepsia: Secondary | ICD-10-CM | POA: Diagnosis not present

## 2022-05-02 DIAGNOSIS — I10 Essential (primary) hypertension: Secondary | ICD-10-CM | POA: Diagnosis not present

## 2022-05-02 DIAGNOSIS — F3342 Major depressive disorder, recurrent, in full remission: Secondary | ICD-10-CM | POA: Diagnosis not present

## 2022-05-02 DIAGNOSIS — R7989 Other specified abnormal findings of blood chemistry: Secondary | ICD-10-CM | POA: Diagnosis not present

## 2022-05-02 DIAGNOSIS — L719 Rosacea, unspecified: Secondary | ICD-10-CM | POA: Diagnosis not present

## 2022-05-02 DIAGNOSIS — Z1389 Encounter for screening for other disorder: Secondary | ICD-10-CM | POA: Diagnosis not present

## 2022-05-02 DIAGNOSIS — Z0000 Encounter for general adult medical examination without abnormal findings: Secondary | ICD-10-CM | POA: Diagnosis not present

## 2022-05-02 DIAGNOSIS — Z23 Encounter for immunization: Secondary | ICD-10-CM | POA: Diagnosis not present

## 2022-05-02 DIAGNOSIS — M810 Age-related osteoporosis without current pathological fracture: Secondary | ICD-10-CM | POA: Diagnosis not present

## 2022-05-02 DIAGNOSIS — G479 Sleep disorder, unspecified: Secondary | ICD-10-CM | POA: Diagnosis not present

## 2022-05-02 DIAGNOSIS — J309 Allergic rhinitis, unspecified: Secondary | ICD-10-CM | POA: Diagnosis not present

## 2022-05-02 DIAGNOSIS — Z01419 Encounter for gynecological examination (general) (routine) without abnormal findings: Secondary | ICD-10-CM | POA: Diagnosis not present

## 2022-05-07 ENCOUNTER — Other Ambulatory Visit: Payer: Self-pay | Admitting: Family Medicine

## 2022-05-07 DIAGNOSIS — Z1231 Encounter for screening mammogram for malignant neoplasm of breast: Secondary | ICD-10-CM

## 2022-05-28 DIAGNOSIS — M9903 Segmental and somatic dysfunction of lumbar region: Secondary | ICD-10-CM | POA: Diagnosis not present

## 2022-05-28 DIAGNOSIS — M9904 Segmental and somatic dysfunction of sacral region: Secondary | ICD-10-CM | POA: Diagnosis not present

## 2022-05-28 DIAGNOSIS — M9905 Segmental and somatic dysfunction of pelvic region: Secondary | ICD-10-CM | POA: Diagnosis not present

## 2022-05-28 DIAGNOSIS — S338XXA Sprain of other parts of lumbar spine and pelvis, initial encounter: Secondary | ICD-10-CM | POA: Diagnosis not present

## 2022-05-28 DIAGNOSIS — M5137 Other intervertebral disc degeneration, lumbosacral region: Secondary | ICD-10-CM | POA: Diagnosis not present

## 2022-05-28 DIAGNOSIS — M47814 Spondylosis without myelopathy or radiculopathy, thoracic region: Secondary | ICD-10-CM | POA: Diagnosis not present

## 2022-05-28 DIAGNOSIS — M5136 Other intervertebral disc degeneration, lumbar region: Secondary | ICD-10-CM | POA: Diagnosis not present

## 2022-05-28 DIAGNOSIS — G510 Bell's palsy: Secondary | ICD-10-CM | POA: Diagnosis not present

## 2022-05-28 DIAGNOSIS — M9902 Segmental and somatic dysfunction of thoracic region: Secondary | ICD-10-CM | POA: Diagnosis not present

## 2022-06-13 DIAGNOSIS — Z23 Encounter for immunization: Secondary | ICD-10-CM | POA: Diagnosis not present

## 2022-06-27 DIAGNOSIS — M9905 Segmental and somatic dysfunction of pelvic region: Secondary | ICD-10-CM | POA: Diagnosis not present

## 2022-06-27 DIAGNOSIS — M9902 Segmental and somatic dysfunction of thoracic region: Secondary | ICD-10-CM | POA: Diagnosis not present

## 2022-06-27 DIAGNOSIS — M5137 Other intervertebral disc degeneration, lumbosacral region: Secondary | ICD-10-CM | POA: Diagnosis not present

## 2022-06-27 DIAGNOSIS — S338XXA Sprain of other parts of lumbar spine and pelvis, initial encounter: Secondary | ICD-10-CM | POA: Diagnosis not present

## 2022-06-27 DIAGNOSIS — H524 Presbyopia: Secondary | ICD-10-CM | POA: Diagnosis not present

## 2022-06-27 DIAGNOSIS — M1711 Unilateral primary osteoarthritis, right knee: Secondary | ICD-10-CM | POA: Diagnosis not present

## 2022-06-27 DIAGNOSIS — H52223 Regular astigmatism, bilateral: Secondary | ICD-10-CM | POA: Diagnosis not present

## 2022-06-27 DIAGNOSIS — G510 Bell's palsy: Secondary | ICD-10-CM | POA: Diagnosis not present

## 2022-06-27 DIAGNOSIS — H5203 Hypermetropia, bilateral: Secondary | ICD-10-CM | POA: Diagnosis not present

## 2022-06-27 DIAGNOSIS — H401132 Primary open-angle glaucoma, bilateral, moderate stage: Secondary | ICD-10-CM | POA: Diagnosis not present

## 2022-06-27 DIAGNOSIS — H53143 Visual discomfort, bilateral: Secondary | ICD-10-CM | POA: Diagnosis not present

## 2022-06-27 DIAGNOSIS — M47814 Spondylosis without myelopathy or radiculopathy, thoracic region: Secondary | ICD-10-CM | POA: Diagnosis not present

## 2022-06-27 DIAGNOSIS — M5136 Other intervertebral disc degeneration, lumbar region: Secondary | ICD-10-CM | POA: Diagnosis not present

## 2022-06-27 DIAGNOSIS — M9903 Segmental and somatic dysfunction of lumbar region: Secondary | ICD-10-CM | POA: Diagnosis not present

## 2022-06-27 DIAGNOSIS — M9904 Segmental and somatic dysfunction of sacral region: Secondary | ICD-10-CM | POA: Diagnosis not present

## 2022-07-01 ENCOUNTER — Ambulatory Visit
Admission: RE | Admit: 2022-07-01 | Discharge: 2022-07-01 | Disposition: A | Discharge status: Home / Self Care | Payer: Medicare Other | Source: Ambulatory Visit | Attending: Family Medicine | Admitting: Family Medicine

## 2022-07-01 DIAGNOSIS — Z1231 Encounter for screening mammogram for malignant neoplasm of breast: Secondary | ICD-10-CM

## 2022-07-04 DIAGNOSIS — M1711 Unilateral primary osteoarthritis, right knee: Secondary | ICD-10-CM | POA: Diagnosis not present

## 2022-08-16 DIAGNOSIS — H401131 Primary open-angle glaucoma, bilateral, mild stage: Secondary | ICD-10-CM | POA: Diagnosis not present

## 2022-08-16 DIAGNOSIS — H401121 Primary open-angle glaucoma, left eye, mild stage: Secondary | ICD-10-CM | POA: Diagnosis not present

## 2022-08-16 DIAGNOSIS — H4010X1 Unspecified open-angle glaucoma, mild stage: Secondary | ICD-10-CM | POA: Diagnosis not present

## 2022-08-16 DIAGNOSIS — H5340 Unspecified visual field defects: Secondary | ICD-10-CM | POA: Diagnosis not present

## 2022-08-16 DIAGNOSIS — H5202 Hypermetropia, left eye: Secondary | ICD-10-CM | POA: Diagnosis not present

## 2022-08-16 DIAGNOSIS — H52223 Regular astigmatism, bilateral: Secondary | ICD-10-CM | POA: Diagnosis not present

## 2022-08-16 DIAGNOSIS — H40012 Open angle with borderline findings, low risk, left eye: Secondary | ICD-10-CM | POA: Diagnosis not present

## 2022-08-16 DIAGNOSIS — H524 Presbyopia: Secondary | ICD-10-CM | POA: Diagnosis not present

## 2022-08-19 DIAGNOSIS — Z23 Encounter for immunization: Secondary | ICD-10-CM | POA: Diagnosis not present

## 2022-09-23 DIAGNOSIS — L820 Inflamed seborrheic keratosis: Secondary | ICD-10-CM | POA: Diagnosis not present

## 2022-09-23 DIAGNOSIS — L570 Actinic keratosis: Secondary | ICD-10-CM | POA: Diagnosis not present

## 2022-09-23 DIAGNOSIS — D485 Neoplasm of uncertain behavior of skin: Secondary | ICD-10-CM | POA: Diagnosis not present

## 2022-09-23 DIAGNOSIS — Z85828 Personal history of other malignant neoplasm of skin: Secondary | ICD-10-CM | POA: Diagnosis not present

## 2022-11-07 DIAGNOSIS — G479 Sleep disorder, unspecified: Secondary | ICD-10-CM | POA: Diagnosis not present

## 2022-11-07 DIAGNOSIS — J309 Allergic rhinitis, unspecified: Secondary | ICD-10-CM | POA: Diagnosis not present

## 2022-11-07 DIAGNOSIS — M810 Age-related osteoporosis without current pathological fracture: Secondary | ICD-10-CM | POA: Diagnosis not present

## 2022-11-07 DIAGNOSIS — I10 Essential (primary) hypertension: Secondary | ICD-10-CM | POA: Diagnosis not present

## 2022-11-07 DIAGNOSIS — R053 Chronic cough: Secondary | ICD-10-CM | POA: Diagnosis not present

## 2022-11-07 DIAGNOSIS — K649 Unspecified hemorrhoids: Secondary | ICD-10-CM | POA: Diagnosis not present

## 2022-11-07 DIAGNOSIS — K30 Functional dyspepsia: Secondary | ICD-10-CM | POA: Diagnosis not present

## 2022-11-07 DIAGNOSIS — L719 Rosacea, unspecified: Secondary | ICD-10-CM | POA: Diagnosis not present

## 2022-11-07 DIAGNOSIS — F3342 Major depressive disorder, recurrent, in full remission: Secondary | ICD-10-CM | POA: Diagnosis not present

## 2022-11-11 DIAGNOSIS — I872 Venous insufficiency (chronic) (peripheral): Secondary | ICD-10-CM | POA: Diagnosis not present

## 2022-11-11 DIAGNOSIS — I8312 Varicose veins of left lower extremity with inflammation: Secondary | ICD-10-CM | POA: Diagnosis not present

## 2022-11-11 DIAGNOSIS — L821 Other seborrheic keratosis: Secondary | ICD-10-CM | POA: Diagnosis not present

## 2022-11-11 DIAGNOSIS — D1801 Hemangioma of skin and subcutaneous tissue: Secondary | ICD-10-CM | POA: Diagnosis not present

## 2022-11-11 DIAGNOSIS — I8311 Varicose veins of right lower extremity with inflammation: Secondary | ICD-10-CM | POA: Diagnosis not present

## 2022-11-11 DIAGNOSIS — Z85828 Personal history of other malignant neoplasm of skin: Secondary | ICD-10-CM | POA: Diagnosis not present

## 2023-03-13 DIAGNOSIS — M1711 Unilateral primary osteoarthritis, right knee: Secondary | ICD-10-CM | POA: Diagnosis not present

## 2023-03-18 DIAGNOSIS — L820 Inflamed seborrheic keratosis: Secondary | ICD-10-CM | POA: Diagnosis not present

## 2023-03-18 DIAGNOSIS — L821 Other seborrheic keratosis: Secondary | ICD-10-CM | POA: Diagnosis not present

## 2023-03-18 DIAGNOSIS — D485 Neoplasm of uncertain behavior of skin: Secondary | ICD-10-CM | POA: Diagnosis not present

## 2023-03-18 DIAGNOSIS — D1801 Hemangioma of skin and subcutaneous tissue: Secondary | ICD-10-CM | POA: Diagnosis not present

## 2023-03-18 DIAGNOSIS — Z85828 Personal history of other malignant neoplasm of skin: Secondary | ICD-10-CM | POA: Diagnosis not present

## 2023-03-18 DIAGNOSIS — C44729 Squamous cell carcinoma of skin of left lower limb, including hip: Secondary | ICD-10-CM | POA: Diagnosis not present

## 2023-03-18 DIAGNOSIS — L570 Actinic keratosis: Secondary | ICD-10-CM | POA: Diagnosis not present

## 2023-03-20 DIAGNOSIS — M1711 Unilateral primary osteoarthritis, right knee: Secondary | ICD-10-CM | POA: Diagnosis not present

## 2023-03-27 DIAGNOSIS — M1711 Unilateral primary osteoarthritis, right knee: Secondary | ICD-10-CM | POA: Diagnosis not present

## 2023-04-02 ENCOUNTER — Other Ambulatory Visit: Payer: Self-pay | Admitting: Family Medicine

## 2023-04-02 DIAGNOSIS — Z85828 Personal history of other malignant neoplasm of skin: Secondary | ICD-10-CM | POA: Diagnosis not present

## 2023-04-02 DIAGNOSIS — C44729 Squamous cell carcinoma of skin of left lower limb, including hip: Secondary | ICD-10-CM | POA: Diagnosis not present

## 2023-04-02 DIAGNOSIS — Z1231 Encounter for screening mammogram for malignant neoplasm of breast: Secondary | ICD-10-CM

## 2023-05-19 DIAGNOSIS — L03115 Cellulitis of right lower limb: Secondary | ICD-10-CM | POA: Diagnosis not present

## 2023-05-19 DIAGNOSIS — W1830XA Fall on same level, unspecified, initial encounter: Secondary | ICD-10-CM | POA: Diagnosis not present

## 2023-06-09 DIAGNOSIS — K30 Functional dyspepsia: Secondary | ICD-10-CM | POA: Diagnosis not present

## 2023-06-09 DIAGNOSIS — R7989 Other specified abnormal findings of blood chemistry: Secondary | ICD-10-CM | POA: Diagnosis not present

## 2023-06-09 DIAGNOSIS — F3342 Major depressive disorder, recurrent, in full remission: Secondary | ICD-10-CM | POA: Diagnosis not present

## 2023-06-09 DIAGNOSIS — K649 Unspecified hemorrhoids: Secondary | ICD-10-CM | POA: Diagnosis not present

## 2023-06-09 DIAGNOSIS — M810 Age-related osteoporosis without current pathological fracture: Secondary | ICD-10-CM | POA: Diagnosis not present

## 2023-06-09 DIAGNOSIS — J309 Allergic rhinitis, unspecified: Secondary | ICD-10-CM | POA: Diagnosis not present

## 2023-06-09 DIAGNOSIS — I10 Essential (primary) hypertension: Secondary | ICD-10-CM | POA: Diagnosis not present

## 2023-06-09 DIAGNOSIS — L719 Rosacea, unspecified: Secondary | ICD-10-CM | POA: Diagnosis not present

## 2023-06-09 DIAGNOSIS — G479 Sleep disorder, unspecified: Secondary | ICD-10-CM | POA: Diagnosis not present

## 2023-06-09 DIAGNOSIS — R053 Chronic cough: Secondary | ICD-10-CM | POA: Diagnosis not present

## 2023-06-09 DIAGNOSIS — Z6829 Body mass index (BMI) 29.0-29.9, adult: Secondary | ICD-10-CM | POA: Diagnosis not present

## 2023-06-11 ENCOUNTER — Other Ambulatory Visit: Payer: Self-pay | Admitting: Family Medicine

## 2023-06-11 DIAGNOSIS — M810 Age-related osteoporosis without current pathological fracture: Secondary | ICD-10-CM

## 2023-06-16 DIAGNOSIS — Z0000 Encounter for general adult medical examination without abnormal findings: Secondary | ICD-10-CM | POA: Diagnosis not present

## 2023-06-16 DIAGNOSIS — Z1389 Encounter for screening for other disorder: Secondary | ICD-10-CM | POA: Diagnosis not present

## 2023-06-16 DIAGNOSIS — E663 Overweight: Secondary | ICD-10-CM | POA: Diagnosis not present

## 2023-06-16 DIAGNOSIS — I10 Essential (primary) hypertension: Secondary | ICD-10-CM | POA: Diagnosis not present

## 2023-07-02 DIAGNOSIS — H5202 Hypermetropia, left eye: Secondary | ICD-10-CM | POA: Diagnosis not present

## 2023-07-02 DIAGNOSIS — H52223 Regular astigmatism, bilateral: Secondary | ICD-10-CM | POA: Diagnosis not present

## 2023-07-02 DIAGNOSIS — H524 Presbyopia: Secondary | ICD-10-CM | POA: Diagnosis not present

## 2023-07-02 DIAGNOSIS — H53143 Visual discomfort, bilateral: Secondary | ICD-10-CM | POA: Diagnosis not present

## 2023-07-02 DIAGNOSIS — H401121 Primary open-angle glaucoma, left eye, mild stage: Secondary | ICD-10-CM | POA: Diagnosis not present

## 2023-07-02 DIAGNOSIS — H25813 Combined forms of age-related cataract, bilateral: Secondary | ICD-10-CM | POA: Diagnosis not present

## 2023-07-07 ENCOUNTER — Ambulatory Visit
Admission: RE | Admit: 2023-07-07 | Discharge: 2023-07-07 | Disposition: A | Discharge status: Home / Self Care | Payer: Medicare Other | Source: Ambulatory Visit | Attending: Family Medicine | Admitting: Family Medicine

## 2023-07-07 DIAGNOSIS — Z1231 Encounter for screening mammogram for malignant neoplasm of breast: Secondary | ICD-10-CM

## 2023-08-04 DIAGNOSIS — Z23 Encounter for immunization: Secondary | ICD-10-CM | POA: Diagnosis not present

## 2023-09-01 DIAGNOSIS — H903 Sensorineural hearing loss, bilateral: Secondary | ICD-10-CM | POA: Diagnosis not present

## 2023-11-06 DIAGNOSIS — M1711 Unilateral primary osteoarthritis, right knee: Secondary | ICD-10-CM | POA: Diagnosis not present

## 2023-11-13 DIAGNOSIS — M1711 Unilateral primary osteoarthritis, right knee: Secondary | ICD-10-CM | POA: Diagnosis not present

## 2023-11-20 DIAGNOSIS — M1711 Unilateral primary osteoarthritis, right knee: Secondary | ICD-10-CM | POA: Diagnosis not present

## 2023-12-16 DIAGNOSIS — M25561 Pain in right knee: Secondary | ICD-10-CM | POA: Diagnosis not present

## 2023-12-16 DIAGNOSIS — I10 Essential (primary) hypertension: Secondary | ICD-10-CM | POA: Diagnosis not present

## 2023-12-16 DIAGNOSIS — G479 Sleep disorder, unspecified: Secondary | ICD-10-CM | POA: Diagnosis not present

## 2023-12-16 DIAGNOSIS — F321 Major depressive disorder, single episode, moderate: Secondary | ICD-10-CM | POA: Diagnosis not present

## 2023-12-16 DIAGNOSIS — K30 Functional dyspepsia: Secondary | ICD-10-CM | POA: Diagnosis not present

## 2023-12-16 DIAGNOSIS — Z6829 Body mass index (BMI) 29.0-29.9, adult: Secondary | ICD-10-CM | POA: Diagnosis not present

## 2023-12-16 DIAGNOSIS — L719 Rosacea, unspecified: Secondary | ICD-10-CM | POA: Diagnosis not present

## 2023-12-16 DIAGNOSIS — G8929 Other chronic pain: Secondary | ICD-10-CM | POA: Diagnosis not present

## 2023-12-16 DIAGNOSIS — J309 Allergic rhinitis, unspecified: Secondary | ICD-10-CM | POA: Diagnosis not present

## 2023-12-16 DIAGNOSIS — M810 Age-related osteoporosis without current pathological fracture: Secondary | ICD-10-CM | POA: Diagnosis not present

## 2023-12-24 ENCOUNTER — Ambulatory Visit
Admission: RE | Admit: 2023-12-24 | Discharge: 2023-12-24 | Disposition: A | Discharge status: Home / Self Care | Payer: Medicare Other | Source: Ambulatory Visit | Attending: Family Medicine | Admitting: Family Medicine

## 2023-12-24 DIAGNOSIS — M8588 Other specified disorders of bone density and structure, other site: Secondary | ICD-10-CM | POA: Diagnosis not present

## 2023-12-24 DIAGNOSIS — E2839 Other primary ovarian failure: Secondary | ICD-10-CM | POA: Diagnosis not present

## 2023-12-24 DIAGNOSIS — M810 Age-related osteoporosis without current pathological fracture: Secondary | ICD-10-CM

## 2023-12-24 DIAGNOSIS — N958 Other specified menopausal and perimenopausal disorders: Secondary | ICD-10-CM | POA: Diagnosis not present

## 2024-01-12 DIAGNOSIS — H5340 Unspecified visual field defects: Secondary | ICD-10-CM | POA: Diagnosis not present

## 2024-01-12 DIAGNOSIS — H40012 Open angle with borderline findings, low risk, left eye: Secondary | ICD-10-CM | POA: Diagnosis not present

## 2024-01-12 DIAGNOSIS — H401131 Primary open-angle glaucoma, bilateral, mild stage: Secondary | ICD-10-CM | POA: Diagnosis not present

## 2024-01-14 DIAGNOSIS — M1711 Unilateral primary osteoarthritis, right knee: Secondary | ICD-10-CM | POA: Diagnosis not present

## 2024-01-20 DIAGNOSIS — I10 Essential (primary) hypertension: Secondary | ICD-10-CM | POA: Diagnosis not present

## 2024-01-20 DIAGNOSIS — M1711 Unilateral primary osteoarthritis, right knee: Secondary | ICD-10-CM | POA: Diagnosis not present

## 2024-01-21 DIAGNOSIS — L821 Other seborrheic keratosis: Secondary | ICD-10-CM | POA: Diagnosis not present

## 2024-01-21 DIAGNOSIS — L814 Other melanin hyperpigmentation: Secondary | ICD-10-CM | POA: Diagnosis not present

## 2024-01-21 DIAGNOSIS — D1801 Hemangioma of skin and subcutaneous tissue: Secondary | ICD-10-CM | POA: Diagnosis not present

## 2024-01-21 DIAGNOSIS — Z85828 Personal history of other malignant neoplasm of skin: Secondary | ICD-10-CM | POA: Diagnosis not present

## 2024-01-21 DIAGNOSIS — D225 Melanocytic nevi of trunk: Secondary | ICD-10-CM | POA: Diagnosis not present

## 2024-01-22 DIAGNOSIS — M1711 Unilateral primary osteoarthritis, right knee: Secondary | ICD-10-CM | POA: Diagnosis not present

## 2024-01-27 DIAGNOSIS — M1711 Unilateral primary osteoarthritis, right knee: Secondary | ICD-10-CM | POA: Diagnosis not present

## 2024-01-29 DIAGNOSIS — M1711 Unilateral primary osteoarthritis, right knee: Secondary | ICD-10-CM | POA: Diagnosis not present

## 2024-02-03 DIAGNOSIS — M1711 Unilateral primary osteoarthritis, right knee: Secondary | ICD-10-CM | POA: Diagnosis not present

## 2024-02-05 DIAGNOSIS — M1711 Unilateral primary osteoarthritis, right knee: Secondary | ICD-10-CM | POA: Diagnosis not present

## 2024-02-10 DIAGNOSIS — M1711 Unilateral primary osteoarthritis, right knee: Secondary | ICD-10-CM | POA: Diagnosis not present

## 2024-02-12 DIAGNOSIS — M1711 Unilateral primary osteoarthritis, right knee: Secondary | ICD-10-CM | POA: Diagnosis not present

## 2024-02-17 DIAGNOSIS — M1711 Unilateral primary osteoarthritis, right knee: Secondary | ICD-10-CM | POA: Diagnosis not present

## 2024-02-18 DIAGNOSIS — I10 Essential (primary) hypertension: Secondary | ICD-10-CM | POA: Diagnosis not present

## 2024-02-18 DIAGNOSIS — E663 Overweight: Secondary | ICD-10-CM | POA: Diagnosis not present

## 2024-02-18 DIAGNOSIS — M810 Age-related osteoporosis without current pathological fracture: Secondary | ICD-10-CM | POA: Diagnosis not present

## 2024-02-18 DIAGNOSIS — F3342 Major depressive disorder, recurrent, in full remission: Secondary | ICD-10-CM | POA: Diagnosis not present

## 2024-02-19 DIAGNOSIS — M542 Cervicalgia: Secondary | ICD-10-CM | POA: Diagnosis not present

## 2024-02-19 DIAGNOSIS — Z6829 Body mass index (BMI) 29.0-29.9, adult: Secondary | ICD-10-CM | POA: Diagnosis not present

## 2024-02-19 DIAGNOSIS — I10 Essential (primary) hypertension: Secondary | ICD-10-CM | POA: Diagnosis not present

## 2024-02-24 DIAGNOSIS — M1711 Unilateral primary osteoarthritis, right knee: Secondary | ICD-10-CM | POA: Diagnosis not present

## 2024-03-20 DIAGNOSIS — M810 Age-related osteoporosis without current pathological fracture: Secondary | ICD-10-CM | POA: Diagnosis not present

## 2024-03-20 DIAGNOSIS — F3342 Major depressive disorder, recurrent, in full remission: Secondary | ICD-10-CM | POA: Diagnosis not present

## 2024-03-20 DIAGNOSIS — I10 Essential (primary) hypertension: Secondary | ICD-10-CM | POA: Diagnosis not present

## 2024-03-20 DIAGNOSIS — E663 Overweight: Secondary | ICD-10-CM | POA: Diagnosis not present

## 2024-03-22 DIAGNOSIS — I10 Essential (primary) hypertension: Secondary | ICD-10-CM | POA: Diagnosis not present

## 2024-03-24 DIAGNOSIS — R300 Dysuria: Secondary | ICD-10-CM | POA: Diagnosis not present

## 2024-03-24 DIAGNOSIS — N3000 Acute cystitis without hematuria: Secondary | ICD-10-CM | POA: Diagnosis not present

## 2024-04-13 DIAGNOSIS — M1711 Unilateral primary osteoarthritis, right knee: Secondary | ICD-10-CM | POA: Diagnosis not present

## 2024-04-23 DIAGNOSIS — I10 Essential (primary) hypertension: Secondary | ICD-10-CM | POA: Diagnosis not present

## 2024-05-01 DIAGNOSIS — L03116 Cellulitis of left lower limb: Secondary | ICD-10-CM | POA: Diagnosis not present

## 2024-05-03 DIAGNOSIS — L03116 Cellulitis of left lower limb: Secondary | ICD-10-CM | POA: Diagnosis not present

## 2024-05-05 DIAGNOSIS — I10 Essential (primary) hypertension: Secondary | ICD-10-CM | POA: Diagnosis not present

## 2024-05-05 DIAGNOSIS — L03115 Cellulitis of right lower limb: Secondary | ICD-10-CM | POA: Diagnosis not present

## 2024-05-05 DIAGNOSIS — Z87891 Personal history of nicotine dependence: Secondary | ICD-10-CM | POA: Diagnosis not present

## 2024-05-05 DIAGNOSIS — L089 Local infection of the skin and subcutaneous tissue, unspecified: Secondary | ICD-10-CM | POA: Diagnosis not present

## 2024-05-05 DIAGNOSIS — T148XXA Other injury of unspecified body region, initial encounter: Secondary | ICD-10-CM | POA: Diagnosis not present

## 2024-05-05 DIAGNOSIS — E871 Hypo-osmolality and hyponatremia: Secondary | ICD-10-CM | POA: Diagnosis not present

## 2024-05-05 DIAGNOSIS — Z79899 Other long term (current) drug therapy: Secondary | ICD-10-CM | POA: Diagnosis not present

## 2024-05-05 DIAGNOSIS — K219 Gastro-esophageal reflux disease without esophagitis: Secondary | ICD-10-CM | POA: Diagnosis not present

## 2024-05-05 DIAGNOSIS — S8992XA Unspecified injury of left lower leg, initial encounter: Secondary | ICD-10-CM | POA: Diagnosis not present

## 2024-05-05 DIAGNOSIS — L03116 Cellulitis of left lower limb: Secondary | ICD-10-CM | POA: Diagnosis not present

## 2024-05-05 DIAGNOSIS — Z6826 Body mass index (BMI) 26.0-26.9, adult: Secondary | ICD-10-CM | POA: Diagnosis not present

## 2024-05-05 DIAGNOSIS — R600 Localized edema: Secondary | ICD-10-CM | POA: Diagnosis not present

## 2024-05-05 DIAGNOSIS — E663 Overweight: Secondary | ICD-10-CM | POA: Diagnosis not present

## 2024-05-05 DIAGNOSIS — I872 Venous insufficiency (chronic) (peripheral): Secondary | ICD-10-CM | POA: Diagnosis not present

## 2024-05-05 DIAGNOSIS — S81812A Laceration without foreign body, left lower leg, initial encounter: Secondary | ICD-10-CM | POA: Diagnosis not present

## 2024-05-10 DIAGNOSIS — M542 Cervicalgia: Secondary | ICD-10-CM | POA: Diagnosis not present

## 2024-05-10 DIAGNOSIS — I10 Essential (primary) hypertension: Secondary | ICD-10-CM | POA: Diagnosis not present

## 2024-05-10 DIAGNOSIS — M810 Age-related osteoporosis without current pathological fracture: Secondary | ICD-10-CM | POA: Diagnosis not present

## 2024-05-10 DIAGNOSIS — L719 Rosacea, unspecified: Secondary | ICD-10-CM | POA: Diagnosis not present

## 2024-05-10 DIAGNOSIS — J309 Allergic rhinitis, unspecified: Secondary | ICD-10-CM | POA: Diagnosis not present

## 2024-05-10 DIAGNOSIS — Z6828 Body mass index (BMI) 28.0-28.9, adult: Secondary | ICD-10-CM | POA: Diagnosis not present

## 2024-05-10 DIAGNOSIS — E871 Hypo-osmolality and hyponatremia: Secondary | ICD-10-CM | POA: Diagnosis not present

## 2024-05-10 DIAGNOSIS — F321 Major depressive disorder, single episode, moderate: Secondary | ICD-10-CM | POA: Diagnosis not present

## 2024-05-10 DIAGNOSIS — B3731 Acute candidiasis of vulva and vagina: Secondary | ICD-10-CM | POA: Diagnosis not present

## 2024-05-10 DIAGNOSIS — L03116 Cellulitis of left lower limb: Secondary | ICD-10-CM | POA: Diagnosis not present

## 2024-05-10 DIAGNOSIS — K30 Functional dyspepsia: Secondary | ICD-10-CM | POA: Diagnosis not present

## 2024-05-10 DIAGNOSIS — R7989 Other specified abnormal findings of blood chemistry: Secondary | ICD-10-CM | POA: Diagnosis not present

## 2024-05-20 DIAGNOSIS — E663 Overweight: Secondary | ICD-10-CM | POA: Diagnosis not present

## 2024-05-20 DIAGNOSIS — M810 Age-related osteoporosis without current pathological fracture: Secondary | ICD-10-CM | POA: Diagnosis not present

## 2024-05-20 DIAGNOSIS — F3342 Major depressive disorder, recurrent, in full remission: Secondary | ICD-10-CM | POA: Diagnosis not present

## 2024-05-20 DIAGNOSIS — I10 Essential (primary) hypertension: Secondary | ICD-10-CM | POA: Diagnosis not present

## 2024-05-25 ENCOUNTER — Other Ambulatory Visit: Payer: Self-pay | Admitting: Family Medicine

## 2024-05-25 DIAGNOSIS — Z1231 Encounter for screening mammogram for malignant neoplasm of breast: Secondary | ICD-10-CM

## 2024-06-15 DIAGNOSIS — H409 Unspecified glaucoma: Secondary | ICD-10-CM | POA: Diagnosis not present

## 2024-06-15 DIAGNOSIS — J309 Allergic rhinitis, unspecified: Secondary | ICD-10-CM | POA: Diagnosis not present

## 2024-06-15 DIAGNOSIS — K30 Functional dyspepsia: Secondary | ICD-10-CM | POA: Diagnosis not present

## 2024-06-15 DIAGNOSIS — I10 Essential (primary) hypertension: Secondary | ICD-10-CM | POA: Diagnosis not present

## 2024-06-15 DIAGNOSIS — Z6827 Body mass index (BMI) 27.0-27.9, adult: Secondary | ICD-10-CM | POA: Diagnosis not present

## 2024-06-15 DIAGNOSIS — F321 Major depressive disorder, single episode, moderate: Secondary | ICD-10-CM | POA: Diagnosis not present

## 2024-06-15 DIAGNOSIS — L719 Rosacea, unspecified: Secondary | ICD-10-CM | POA: Diagnosis not present

## 2024-06-15 DIAGNOSIS — M810 Age-related osteoporosis without current pathological fracture: Secondary | ICD-10-CM | POA: Diagnosis not present

## 2024-06-15 DIAGNOSIS — G479 Sleep disorder, unspecified: Secondary | ICD-10-CM | POA: Diagnosis not present

## 2024-06-16 DIAGNOSIS — Z0000 Encounter for general adult medical examination without abnormal findings: Secondary | ICD-10-CM | POA: Diagnosis not present

## 2024-06-17 DIAGNOSIS — M1711 Unilateral primary osteoarthritis, right knee: Secondary | ICD-10-CM | POA: Diagnosis not present

## 2024-06-24 DIAGNOSIS — M1711 Unilateral primary osteoarthritis, right knee: Secondary | ICD-10-CM | POA: Diagnosis not present

## 2024-07-01 DIAGNOSIS — M1711 Unilateral primary osteoarthritis, right knee: Secondary | ICD-10-CM | POA: Diagnosis not present

## 2024-07-07 ENCOUNTER — Ambulatory Visit
Admission: RE | Admit: 2024-07-07 | Discharge: 2024-07-07 | Disposition: A | Discharge status: Home / Self Care | Source: Ambulatory Visit | Attending: Family Medicine | Admitting: Family Medicine

## 2024-07-07 DIAGNOSIS — Z1231 Encounter for screening mammogram for malignant neoplasm of breast: Secondary | ICD-10-CM

## 2024-07-08 DIAGNOSIS — H401131 Primary open-angle glaucoma, bilateral, mild stage: Secondary | ICD-10-CM | POA: Diagnosis not present

## 2024-07-08 DIAGNOSIS — H53143 Visual discomfort, bilateral: Secondary | ICD-10-CM | POA: Diagnosis not present

## 2024-07-08 DIAGNOSIS — H25813 Combined forms of age-related cataract, bilateral: Secondary | ICD-10-CM | POA: Diagnosis not present

## 2024-07-22 DIAGNOSIS — Z23 Encounter for immunization: Secondary | ICD-10-CM | POA: Diagnosis not present

## 2024-08-13 DIAGNOSIS — M1711 Unilateral primary osteoarthritis, right knee: Secondary | ICD-10-CM | POA: Diagnosis not present

## 2024-09-21 NOTE — H&P (Signed)
 TOTAL KNEE ADMISSION H&P  Patient is being admitted for right total knee arthroplasty.  Subjective:  Chief Complaint: Right knee pain.  HPI: Morgan Elliott, 78 y.o. female has a history of pain and functional disability in the right knee due to arthritis and has failed non-surgical conservative treatments for greater than 12 weeks to include NSAID's and/or analgesics, corticosteriod injections, viscosupplementation injections, weight reduction as appropriate, and activity modification. Onset of symptoms was gradual, starting several years ago with gradually worsening course since that time. The patient noted no past surgery on the right knee.  Patient currently rates pain in the right knee at 8 out of 10 with activity. Patient has night pain, worsening of pain with activity and weight bearing, pain that interferes with activities of daily living, and crepitus. Patient has evidence of Radiographs of both knees, AP and lateral views, demonstrate the following: Left knee: Prosthesis in excellent position with no periprosthetic abnormalities. Right knee: Bone-on-bone lateral compartment with slight valgus alignment. Patellofemoral narrowing is also noted by imaging studies. There is no active infection.  Patient Active Problem List   Diagnosis Date Noted   OA (osteoarthritis) of knee 07/23/2021   Primary osteoarthritis of left knee 07/23/2021   OA (osteoarthritis) of hip 06/05/2016   Essential hypertension 01/31/2015   Breast cancer, left breast (HCC) 12/09/2011   Atypical ductal hyperplasia of breast 12/09/2011    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   Breast cancer (HCC)    Chronic kidney disease    recent dx of stage 3A Chronic kidney disease   DCIS (ductal carcinoma in situ) of breast 12/09/2011   Depression    GERD (gastroesophageal reflux disease)    H/O hiatal hernia    Hematoma     right breast   Hypertension    borderline   IBS (irritable bowel syndrome)    Osteopenia    Pneumonia    SVD (spontaneous vaginal delivery)    x 1   Varicose veins of both lower extremities     Past Surgical History:  Procedure Laterality Date   BREAST BIOPSY  5/08, 12/2009    x 2 left   BREAST BIOPSY Right 05/2014   Benign    BREAST LUMPECTOMY  06/2010   x 2 right    CERVICAL FUSION  02/2003   C3-C6   COLONOSCOPY     DIAGNOSTIC LAPAROSCOPY     tubal ligation   DILATATION & CURRETTAGE/HYSTEROSCOPY WITH RESECTOCOPE N/A 01/11/2014   Procedure: DILATATION & CURETTAGE/HYSTEROSCOPY WITH RESECTOCOPE;  Surgeon: Ronal Elvie Pinal, MD;  Location: [facility];  Service: Gynecology;  Laterality: N/A;  resection of endometrial polyp   DILATION AND CURETTAGE OF UTERUS     x3 for heavy bleeding   EYE SURGERY     lasik left eye   FOOT SURGERY Left    KNEE ARTHROSCOPY  2012   left   TOTAL HIP ARTHROPLASTY Left 06/05/2016   Procedure: LEFT TOTAL HIP ARTHROPLASTY ANTERIOR APPROACH;  Surgeon: Dempsey Moan, MD;  Location: [facility];  Service: Orthopedics;  Laterality: Left;   TOTAL KNEE ARTHROPLASTY Left 07/23/2021   Procedure: TOTAL KNEE ARTHROPLASTY;  Surgeon: Moan Dempsey, MD;  Location: [facility];  Service: Orthopedics;  Laterality: Left;   TUBAL LIGATION     and lap   UPPER GI ENDOSCOPY  ~2013    Prior to Admission medications   Medication  Sig Start Date End Date Taking? Authorizing Provider  alendronate (FOSAMAX) 70 MG tablet Take 70 mg by mouth every Thursday. 03/01/18   [provider]  amLODipine  (NORVASC ) 5 MG tablet Take 5 mg by mouth every evening.    [provider]  cetirizine (ZYRTEC) 10 MG tablet Take 10 mg by mouth at bedtime.     [provider]  citalopram  (CELEXA ) 20 MG tablet Take 1 tablet (20 mg total) by mouth daily. 12/06/13   Cleotilde Ronal RAMAN, MD  clindamycin (CLEOCIN T) 1 % lotion Apply 1 application topically daily as needed (acne).    [provider]  fluticasone  (FLONASE ) 50 MCG/ACT nasal spray Place 1 spray into both nostrils daily as needed for allergies. Reported on 04/09/2016    [provider]  gabapentin  (NEURONTIN ) 300 MG capsule Take a 300 mg capsule three times a day for two weeks following surgery.Then take a 300 mg capsule two times a day for two weeks. Then take a 300 mg capsule once a day for two weeks. Then discontinue. 07/24/21   Edmisten, Kristie L, PA  guaifenesin (HUMIBID E) 400 MG TABS tablet Take 400 mg by mouth at bedtime.    [provider]  losartan  (COZAAR ) 50 MG tablet Take 25 mg by mouth every evening.    [provider]  methocarbamol  (ROBAXIN ) 500 MG tablet Take 1 tablet (500 mg total) by mouth every 6 (six) hours as needed for muscle spasms. 07/24/21   Edmisten, Kristie L, PA  OVER THE COUNTER MEDICATION Take 25 mg by mouth at bedtime. CBD Oil    [provider]  oxyCODONE  (OXY IR/ROXICODONE ) 5 MG immediate release tablet Take 1-2 tablets (5-10 mg total) by mouth every 6 (six) hours as needed for moderate pain or severe pain (pain score 4-6). 07/24/21   Edmisten, Roxie CROME, PA  Probiotic Product (ALIGN) 4 MG CAPS Take 4 mg by mouth daily.    [provider]  timolol  (BETIMOL ) 0.25 % ophthalmic solution Place 1 drop into both eyes every morning.    [provider]  zaleplon  (SONATA ) 5 MG capsule TAKE 1 CAPSULE AT BEDTIME AS NEEDED    Lindbergh Sora, MD    Allergies  Allergen Reactions   Nickel     Social History   Socioeconomic History   Marital status: Married    Spouse name: Not on file   Number of children: Not on file   Years of education: Not on file   Highest education level: Not on file  Occupational History   Not on file  Tobacco Use   Smoking status: Never   Smokeless tobacco: Never  Vaping Use   Vaping status: Never Used  Substance and Sexual Activity   Alcohol use: Yes    Alcohol/week: 7.0 standard drinks of alcohol     Types: 7 Glasses of wine per week    Comment: wine daily    Drug use: No   Sexual activity: Yes    Birth control/protection: Post-menopausal, Surgical  Other Topics Concern   Not on file  Social History Narrative   Not on file   Social Drivers of Health   Financial Resource Strain: Not on file  Food Insecurity: No Food Insecurity (05/05/2024)   Received from [facility]   Hunger Vital Sign    Within the past 12 months, you worried that your food would run out before you got the money to buy more.: Never true    Within the  past 12 months, the food you bought just didn't last and you didn't have money to get more.: Never true  Transportation Needs: No Transportation Needs (05/05/2024)   Received from [facility] - Transportation    In the past 12 months, has lack of transportation kept you from medical appointments or from getting medications?: No    In the past 12 months, has lack of transportation kept you from meetings, work, or from getting things needed for daily living?: No  Physical Activity: Not on file  Stress: Stress Concern Present (05/05/2024)   Received from [facility] - Occupational Stress Questionnaire    Do you feel stress - tense, restless, nervous, or anxious, or unable to sleep at night because your mind is troubled all the time - these days?: Rather much  Social Connections: Not on file  Intimate Partner Violence: Not At Risk (05/05/2024)   Received from [facility]   HITS    Over the last 12 months how often did your partner physically hurt you?: Never    Over the last 12 months how often did your partner insult you or talk down to you?: Never    Over the last 12 months how often did your partner threaten you with physical harm?: Never    Over the last 12 months how often did your partner scream or curse at you?: Never    Tobacco Use: Low Risk  (07/07/2024)   Patient History    Smoking Tobacco Use:  Never    Smokeless Tobacco Use: Never    Passive Exposure: Not on file   Social History   Substance and Sexual Activity  Alcohol Use Yes   Alcohol/week: 7.0 standard drinks of alcohol   Types: 7 Glasses of wine per week   Comment: wine daily     Family History  Problem Relation Age of Onset   Hypertension Mother    Heart attack Mother    Hypertension Father    Cancer Maternal Aunt 58       leukemia, Breast cancer   Breast cancer Maternal Aunt    Breast cancer Sister 41   Cancer Paternal Grandfather 7       GI related    Review of Systems  Constitutional:  Negative for chills and fever.  Respiratory:  Negative for cough.   Cardiovascular:  Negative for chest pain.  Gastrointestinal:  Negative for abdominal pain.  Genitourinary:  Negative for dysuria.  Musculoskeletal:  Positive for joint pain.    Objective:  Physical Exam: - Constitutional: Well-developed female, alert, in no apparent distress. Musculoskeletal: - Right hip: Normal range of motion without discomfort. - Right knee: No effusion. Range of motion 0-130 degrees. Significant lateral joint line tenderness. Minimal medial tenderness. No instability. - Gait: Ambulates with a cane on the right side.  Imaging Review Radiographs of both knees, AP and lateral views, demonstrate the following: - Left knee: Prosthesis in excellent position with no periprosthetic abnormalities. - Right knee: Bone-on-bone lateral compartment with slight valgus alignment. Patellofemoral narrowing is also noted.  Assessment/Plan:  End stage arthritis, right knee   The patient history, physical examination, clinical judgment of the provider and imaging studies are consistent with end stage degenerative joint disease of the right knee and total knee arthroplasty is deemed medically necessary. The treatment options including medical management, injection therapy arthroscopy and arthroplasty were discussed at length. The risks and  benefits of total knee arthroplasty were  presented and reviewed. The risks due to aseptic loosening, infection, stiffness, patella tracking problems, thromboembolic complications and other imponderables were discussed. The patient acknowledged the explanation, agreed to proceed with the plan and consent was signed. Patient is being admitted for inpatient treatment for surgery, pain control, PT, OT, prophylactic antibiotics, VTE prophylaxis, progressive ambulation and ADLs and discharge planning. The patient is planning to be discharged home.  Patient's anticipated LOS is less than 2 midnights, meeting these requirements: - Lives within 1 hour of care - Has a competent adult at home to recover with post-op recover - NO history of  - Chronic pain requiring opiods  - Diabetes  - Coronary Artery Disease  - Heart failure  - Heart attack  - Stroke  - DVT/VTE  - Cardiac arrhythmia  - Respiratory Failure/COPD  - Renal failure  - Anemia  - Advanced Liver disease   Therapy Plans: [facility] Physical therapy on [address]  Disposition: Home with Husband  Planned DVT Prophylaxis: Xarelto  10mg  QD (hx breast cancer)  DME Needed: none  PCP: Sari Pay, MD (clearance received)  TXA: IV  Allergies: Latex (blistering), nickel (rash, itching)  Metal Allergies: nickel allergy  - need Zimmer persona Anesthesia Concerns: none  BMI: 28.1  Last HgbA1c: not diabetic  Pharmacy: [facility] to bring on DC  Pain regimen: Oxycodone , Tramadol, Methocarbamol    Other: - nickel allergy  - need Zimmer persona*  - Hx of Breast cancer  - Patient was instructed on what medications to stop prior to surgery. - Follow-up visit in 2 weeks with Dr. Melodi - Begin physical therapy following surgery - Pre-operative lab work as pre-surgical testing - Prescriptions will be provided in hospital at time of discharge  Waddell Sor, PA-C Orthopedic Surgery [facility] [place]

## 2024-10-04 NOTE — Patient Instructions (Signed)
 SURGICAL WAITING ROOM VISITATION  Patients having surgery or a procedure may have no more than 2 support people in the waiting area - these visitors may rotate.    Children ages 55 and under will not be able to visit patients in [facility] under most circumstances.   Visitors with respiratory illnesses are discouraged from visiting and should remain at home.  If the patient needs to stay at the hospital during part of their recovery, the visitor guidelines for inpatient rooms apply. Pre-op nurse will coordinate an appropriate time for 1 support person to accompany patient in pre-op.  This support person may not rotate.    Please refer to the [facility] website for the visitor guidelines for Inpatients (after your surgery is over and you are in a regular room).       Your procedure is scheduled on:   10/18/2024    Report to [facility] Main Entrance    Report to admitting at  0700 AM   Call this number if you have problems the morning of surgery [phone]   Do not eat food :After Midnight.   After Midnight you may have the following liquids until _  0615_____ AM DAY OF SURGERY  Water  Non-Citrus Juices (without pulp, NO RED-Apple, Hopkins grape, Priebe cranberry) Black Coffee (NO MILK/CREAM OR CREAMERS, sugar ok)  Clear Tea (NO MILK/CREAM OR CREAMERS, sugar ok) regular and decaf                             Plain Jell-O (NO RED)                                           Fruit ices (not with fruit pulp, NO RED)                                     Popsicles (NO RED)                                                               Sports drinks like Gatorade (NO RED)                    The day of surgery:  Drink ONE (1) Pre-Surgery Clear Ensure or G2 at  9384JF the morning of surgery. Drink in one sitting. Do not sip.  This drink was given to you during your hospital  pre-op appointment visit. Nothing else to drink after completing the  Pre-Surgery Clear Ensure  or G2.          If you have questions, please contact your surgeons office.      Oral Hygiene is also important to reduce your risk of infection.                                    Remember - BRUSH YOUR TEETH THE MORNING OF SURGERY WITH YOUR REGULAR TOOTHPASTE  DENTURES WILL BE REMOVED PRIOR TO SURGERY PLEASE DO NOT APPLY  Poly grip OR ADHESIVES!!!   Do NOT smoke after Midnight   Stop all vitamins and herbal supplements 7 days before surgery.   Take these medicines the morning of surgery with A SIP OF WATER :  citalopram    DO NOT TAKE ANY ORAL DIABETIC MEDICATIONS DAY OF YOUR SURGERY  Bring CPAP mask and tubing day of surgery.                              You may not have any metal on your body including hair pins, jewelry, and body piercing             Do not wear make-up, lotions, powders, perfumes/cologne, or deodorant  Do not wear nail polish including gel and S&S, artificial/acrylic nails, or any other type of covering on natural nails including finger and toenails. If you have artificial nails, gel coating, etc. that needs to be removed by a nail salon please have this removed prior to surgery or surgery may need to be canceled/ delayed if the surgeon/ anesthesia feels like they are unable to be safely monitored.   Do not shave  48 hours prior to surgery.               Men may shave face and neck.   Do not bring valuables to the hospital. [facility] IS NOT             RESPONSIBLE   FOR VALUABLES.   Contacts, glasses, dentures or bridgework may not be worn into surgery.   Bring small overnight bag day of surgery.   DO NOT BRING YOUR HOME MEDICATIONS TO THE HOSPITAL. PHARMACY WILL DISPENSE MEDICATIONS LISTED ON YOUR MEDICATION LIST TO YOU DURING YOUR ADMISSION IN THE HOSPITAL!    Patients discharged on the day of surgery will not be allowed to drive home.  Someone NEEDS to stay with you for the first 24 hours after anesthesia.   Special Instructions: Bring a copy of  your healthcare power of attorney and living will documents the day of surgery if you haven't scanned them before.              Please read over the following fact sheets you were given: IF YOU HAVE QUESTIONS ABOUT YOUR PRE-OP INSTRUCTIONS PLEASE CALL [phone].   If you received a COVID test during your pre-op visit  it is requested that you wear a mask when out in public, stay away from anyone that may not be feeling well and notify your surgeon if you develop symptoms. If you test positive for Covid or have been in contact with anyone that has tested positive in the last 10 days please notify you surgeon.      Pre-operative 4 CHG Bath Instructions   You can play a key role in reducing the risk of infection after surgery. Your skin needs to be as free of germs as possible. You can reduce the number of germs on your skin by washing with CHG (chlorhexidine  gluconate) soap before surgery. CHG is an antiseptic soap that kills germs and continues to kill germs even after washing.   DO NOT use if you have an allergy  to chlorhexidine /CHG or antibacterial soaps. If your skin becomes reddened or irritated, stop using the CHG and notify one of our RNs at [phone].   Please shower with the CHG soap starting 4 days before surgery using the following schedule:     Please keep in  mind the following:  DO NOT shave, including legs and underarms, starting the day of your first shower.   You may shave your face at any point before/day of surgery.  Place clean sheets on your bed the day you start using CHG soap. Use a clean washcloth (not used since being washed) for each shower. DO NOT sleep with pets once you start using the CHG.   CHG Shower Instructions:  If you choose to wash your hair and private area, wash first with your normal shampoo/soap.  After you use shampoo/soap, rinse your hair and body thoroughly to remove shampoo/soap residue.  Turn the water  OFF and apply about 3 tablespoons (45 ml)  of CHG soap to a CLEAN washcloth.  Apply CHG soap ONLY FROM YOUR NECK DOWN TO YOUR TOES (washing for 3-5 minutes)  DO NOT use CHG soap on face, private areas, open wounds, or sores.  Pay special attention to the area where your surgery is being performed.  If you are having back surgery, having someone wash your back for you may be helpful. Wait 2 minutes after CHG soap is applied, then you may rinse off the CHG soap.  Pat dry with a clean towel  Put on clean clothes/pajamas   If you choose to wear lotion, please use ONLY the CHG-compatible lotions on the back of this paper.     Additional instructions for the day of surgery: DO NOT APPLY any lotions, deodorants, cologne, or perfumes.   Put on clean/comfortable clothes.  Brush your teeth.  Ask your nurse before applying any prescription medications to the skin.      CHG Compatible Lotions   Aveeno Moisturizing lotion  Cetaphil Moisturizing Cream  Cetaphil Moisturizing Lotion  Clairol Herbal Essence Moisturizing Lotion, Dry Skin  Clairol Herbal Essence Moisturizing Lotion, Extra Dry Skin  Clairol Herbal Essence Moisturizing Lotion, Normal Skin  Curel Age Defying Therapeutic Moisturizing Lotion with Alpha Hydroxy  Curel Extreme Care Body Lotion  Curel Soothing Hands Moisturizing Hand Lotion  Curel Therapeutic Moisturizing Cream, Fragrance-Free  Curel Therapeutic Moisturizing Lotion, Fragrance-Free  Curel Therapeutic Moisturizing Lotion, Original Formula  Eucerin Daily Replenishing Lotion  Eucerin Dry Skin Therapy Plus Alpha Hydroxy Crme  Eucerin Dry Skin Therapy Plus Alpha Hydroxy Lotion  Eucerin Original Crme  Eucerin Original Lotion  Eucerin Plus Crme Eucerin Plus Lotion  Eucerin TriLipid Replenishing Lotion  Keri Anti-Bacterial Hand Lotion  Keri Deep Conditioning Original Lotion Dry Skin Formula Softly Scented  Keri Deep Conditioning Original Lotion, Fragrance Free Sensitive Skin Formula  Keri Lotion Fast Absorbing  Fragrance Free Sensitive Skin Formula  Keri Lotion Fast Absorbing Softly Scented Dry Skin Formula  Keri Original Lotion  Keri Skin Renewal Lotion Keri Silky Smooth Lotion  Keri Silky Smooth Sensitive Skin Lotion  Nivea Body Creamy Conditioning Oil  Nivea Body Extra Enriched Teacher, Adult Education Moisturizing Lotion Nivea Crme  Nivea Skin Firming Lotion  NutraDerm 30 Skin Lotion  NutraDerm Skin Lotion  NutraDerm Therapeutic Skin Cream  NutraDerm Therapeutic Skin Lotion  ProShield Protective Hand Cream  Provon moisturizing lotion

## 2024-10-04 NOTE — Progress Notes (Signed)
 Anesthesia Review:  PCP: Cardiologist :  PPM/ ICD: Device Orders: Rep Notified:  Chest x-ray : EKG : Echo : 2006  Stress test: Cardiac Cath :   Activity level:  Sleep Study/ CPAP : Fasting Blood Sugar :      / Checks Blood Sugar -- times a day:    Blood Thinner/ Instructions /Last Dose: ASA / Instructions/ Last Dose :    Nickel Allergy     1303- LVMM.   1309 pt answered phone.  Stated she called into [phone] number this am and LVMM as well as did Case Mgr for [facility] office per pt and stated she was sick with sore throat, cough, headache, etc.  Office told her to call back in a couple of days to them to see how she was and she was informed per office they may reschedule her.

## 2024-10-05 ENCOUNTER — Encounter (HOSPITAL_COMMUNITY): Payer: Self-pay

## 2024-10-05 ENCOUNTER — Encounter (HOSPITAL_COMMUNITY): Admission: RE | Admit: 2024-10-05 | Discharge: 2024-10-05 | Attending: Family Medicine

## 2024-10-16 ENCOUNTER — Encounter (HOSPITAL_BASED_OUTPATIENT_CLINIC_OR_DEPARTMENT_OTHER): Payer: Self-pay

## 2024-10-16 ENCOUNTER — Emergency Department (HOSPITAL_BASED_OUTPATIENT_CLINIC_OR_DEPARTMENT_OTHER)

## 2024-10-16 ENCOUNTER — Other Ambulatory Visit: Payer: Self-pay

## 2024-10-16 ENCOUNTER — Emergency Department (HOSPITAL_BASED_OUTPATIENT_CLINIC_OR_DEPARTMENT_OTHER)
Admission: EM | Admit: 2024-10-16 | Discharge: 2024-10-16 | Disposition: A | Discharge status: Home / Self Care | Attending: Emergency Medicine | Admitting: Emergency Medicine

## 2024-10-16 DIAGNOSIS — R911 Solitary pulmonary nodule: Secondary | ICD-10-CM

## 2024-10-16 DIAGNOSIS — I10 Essential (primary) hypertension: Secondary | ICD-10-CM | POA: Insufficient documentation

## 2024-10-16 DIAGNOSIS — Z91040 Latex allergy status: Secondary | ICD-10-CM | POA: Diagnosis not present

## 2024-10-16 DIAGNOSIS — R059 Cough, unspecified: Secondary | ICD-10-CM | POA: Insufficient documentation

## 2024-10-16 DIAGNOSIS — Z853 Personal history of malignant neoplasm of breast: Secondary | ICD-10-CM | POA: Diagnosis not present

## 2024-10-16 DIAGNOSIS — Z79899 Other long term (current) drug therapy: Secondary | ICD-10-CM | POA: Diagnosis not present

## 2024-10-16 DIAGNOSIS — R079 Chest pain, unspecified: Secondary | ICD-10-CM | POA: Insufficient documentation

## 2024-10-16 LAB — BASIC METABOLIC PANEL WITH GFR
Anion gap: 11 (ref 5–15)
BUN: 12 mg/dL (ref 8–23)
CO2: 25 mmol/L (ref 22–32)
Calcium: 9.8 mg/dL (ref 8.9–10.3)
Chloride: 93 mmol/L — ABNORMAL LOW (ref 98–111)
Creatinine, Ser: 0.77 mg/dL (ref 0.44–1.00)
GFR, Estimated: >60 mL/min
Glucose, Bld: 91 mg/dL (ref 70–99)
Potassium: 5.1 mmol/L (ref 3.5–5.1)
Sodium: 130 mmol/L — ABNORMAL LOW (ref 135–145)

## 2024-10-16 LAB — CBC
HCT: 37.6 % (ref 36.0–46.0)
Hemoglobin: 13.2 g/dL (ref 12.0–15.0)
MCH: 32.3 pg (ref 26.0–34.0)
MCHC: 35.1 g/dL (ref 30.0–36.0)
MCV: 91.9 fL (ref 80.0–100.0)
Platelets: 381 K/uL (ref 150–400)
RBC: 4.09 MIL/uL (ref 3.87–5.11)
RDW: 13.6 % (ref 11.5–15.5)
WBC: 9.4 K/uL (ref 4.0–10.5)
nRBC: 0 % (ref 0.0–0.2)

## 2024-10-16 MED ORDER — ALBUTEROL SULFATE (2.5 MG/3ML) 0.083% IN NEBU: 5.0000 mg | INHALATION_SOLUTION | Freq: Once | RESPIRATORY_TRACT | Status: AC

## 2024-10-16 MED ORDER — ALBUTEROL SULFATE (2.5 MG/3ML) 0.083% IN NEBU
INHALATION_SOLUTION | RESPIRATORY_TRACT | Status: AC
  Administered 2024-10-16: 5 mg via RESPIRATORY_TRACT
  Filled 2024-10-16: qty 6

## 2024-10-16 MED ORDER — ALBUTEROL SULFATE HFA 108 (90 BASE) MCG/ACT IN AERS
2.0000 | INHALATION_SPRAY | Freq: Four times a day (QID) | RESPIRATORY_TRACT | Status: DC | PRN
  Administered 2024-10-16: 2 via RESPIRATORY_TRACT
  Filled 2024-10-16: qty 6.7

## 2024-10-16 MED ORDER — IOHEXOL 350 MG/ML SOLN
75.0000 mL | Freq: Once | INTRAVENOUS | Status: AC | PRN
  Administered 2024-10-16: 75 mL via INTRAVENOUS

## 2024-10-16 MED ORDER — DEXAMETHASONE SOD PHOSPHATE PF 10 MG/ML IJ SOLN
10.0000 mg | Freq: Once | INTRAMUSCULAR | Status: AC
  Administered 2024-10-16: 10 mg via INTRAVENOUS

## 2024-10-16 NOTE — Discharge Instructions (Addendum)
 Use the albuterol  inhaler 2 puffs every 4-6 hours for the next few days then just use it as needed.  Make an appointment to have close follow-up with your primary care doctor.  Return to the emergency room if you have any worsening symptoms.

## 2024-10-16 NOTE — ED Triage Notes (Signed)
+  flu A test 2 weeks ago. Continued cough, congestion, SHOB. States O2 has been running low at home.   No obvious signs of respiratory distress in triage

## 2024-10-16 NOTE — ED Notes (Signed)

## 2024-10-16 NOTE — ED Provider Notes (Signed)
 Patient care was taken over from Dr. Cottie.  Patient recently had the flu 2 weeks ago.  She noticed her oxygen saturations were low at home.  She was awaiting labs and CT.  Labs are nonconcerning.  CT scan does not show any acute abnormality.  No pneumonia or PE.  There is a small lung nodule that the radiologist does not feel needs further monitoring.  There is a questionable spot on one of her thoracic vertebrae on the chest x-ray but there was no abnormal findings on the CT scan.  Initially patient ambulated around the ED and her oxygen saturation was about 85 to 90% on room air.  Her lungs had some scarce wheezing.  She was given a nebulizer treatment.  She felt better and was able to ambulate around the ED with no shortness of breath and no hypoxia.  Given this, I feel that she is okay going home.  She was given a dose of Decadron  as well.  Suspect she has some reactive airway disease related to her recent infection.  She was discharged home in good condition.  She was encouraged to have close follow-up with her PCP.  Return precautions were given.   Lenor Hollering, MD 10/16/24 [phone]

## 2024-10-16 NOTE — ED Provider Notes (Signed)
 " Morgan Elliott   CSN: [id number] Arrival date & time: 10/16/24  1226     Patient presents with: Cough   Morgan Elliott is a 78 y.o. female presenting to ED with complaint of persistent cough and low oxygen level.  Patient reports she tested positive for influenza roughly 2 weeks ago, 12/16.  She feels that the worst of her symptoms had improved, but she has a persistent and productive cough.  She has intermittent chest pains.  She noticed her home oxygen level was as low as 90 to 91% on room air, based on her pulse oximeter, she typically says her oxygen levels above 95 or 96%.  She denies history of COPD, lung problems or asthma.  She reports that she keeps a pulse oximeter for oxygen monitoring during the COVID era.  She does report a distant history of breast cancer, in remission for several years, she is not on active treatment for this.   HPI     Prior to Admission medications  Medication Sig Start Date End Date Taking? Authorizing Provider  amLODipine  (NORVASC ) 2.5 MG tablet Take 2.5 mg by mouth every evening.    [provider]  Calcium Carb-Cholecalciferol (CALCIUM 600+D3) 600-10 MG-MCG TABS Take 1 tablet by mouth daily.    [provider]  cetirizine (ZYRTEC) 10 MG tablet Take 10 mg by mouth at bedtime.     [provider]  cholecalciferol (VITAMIN D3) 25 MCG (1000 UNIT) tablet Take 1,000 Units by mouth daily.    [provider]  citalopram  (CELEXA ) 20 MG tablet Take 1 tablet (20 mg total) by mouth daily. 12/06/13   Cleotilde Ronal RAMAN, MD  clindamycin (CLEOCIN T) 1 % lotion Apply 1 application topically daily as needed (acne).    [provider]  fluticasone  (FLONASE ) 50 MCG/ACT nasal spray Place 1 spray into both nostrils daily as needed for allergies. Reported on 04/09/2016    [provider]  gabapentin  (NEURONTIN ) 300 MG capsule Take a 300 mg capsule three times a day for  two weeks following surgery.Then take a 300 mg capsule two times a day for two weeks. Then take a 300 mg capsule once a day for two weeks. Then discontinue. Patient not taking: Reported on 10/01/2024 07/24/21   Reena Roxie CROME, PA  losartan  (COZAAR ) 50 MG tablet Take 50 mg by mouth every evening.    [provider]  methocarbamol  (ROBAXIN ) 500 MG tablet Take 1 tablet (500 mg total) by mouth every 6 (six) hours as needed for muscle spasms. 07/24/21   Edmisten, Kristie L, PA  OVER THE COUNTER MEDICATION Take 25 mg by mouth at bedtime. CBD Oil    [provider]  oxyCODONE  (OXY IR/ROXICODONE ) 5 MG immediate release tablet Take 1-2 tablets (5-10 mg total) by mouth every 6 (six) hours as needed for moderate pain or severe pain (pain score 4-6). Patient not taking: Reported on 10/01/2024 07/24/21   Reena Roxie CROME, PA  Probiotic Product (ALIGN) 4 MG CAPS Take 4 mg by mouth daily.    [provider]  timolol  (TIMOPTIC ) 0.25 % ophthalmic solution Place 1 drop into both eyes See admin instructions. Instill 1 drop into both eyes in the morning and in the left eye at bedtime    [provider]  zaleplon  (SONATA ) 5 MG capsule TAKE 1 CAPSULE AT BEDTIME AS NEEDED    Lindbergh Sora, MD    Allergies: Latex, Lorazepam, and Nickel  Review of Systems  Updated Vital Signs BP (!) 129/92   Pulse 78   Temp 97.9 F (36.6 C)   Resp 18   LMP 10/21/2001   SpO2 96%   Physical Exam Constitutional:      General: She is not in acute distress. HENT:     Head: Normocephalic and atraumatic.  Eyes:     Conjunctiva/sclera: Conjunctivae normal.     Pupils: Pupils are equal, round, and reactive to light.  Cardiovascular:     Rate and Rhythm: Normal rate and regular rhythm.  Pulmonary:     Effort: Pulmonary effort is normal. No respiratory distress.     Breath sounds: Normal breath sounds. No stridor. No wheezing.  Abdominal:     General: There is no distension.      Tenderness: There is no abdominal tenderness.  Skin:    General: Skin is warm and dry.  Neurological:     General: No focal deficit present.     Mental Status: She is alert. Mental status is at baseline.  Psychiatric:        Mood and Affect: Mood normal.        Behavior: Behavior normal.     (all labs ordered are listed, but only abnormal results are displayed) Labs Reviewed  CBC  BASIC METABOLIC PANEL WITH GFR    EKG: None  Radiology: DG Chest 2 View Result Date: 10/16/2024 CLINICAL DATA:  Shortness of breath. Continued cough and congestion. Low oxygen saturation. History of breast cancer and hypertension. EXAM: CHEST - 2 VIEW COMPARISON:  01/28/2013 FINDINGS: Cardiomediastinal silhouette and pulmonary vasculature are within normal limits. Lungs are clear. ACDF changes the cervical spine again seen. There is increased sclerosis of a midthoracic vertebral body, likely T7. IMPRESSION: 1. No acute cardiopulmonary process. 2. Increased sclerosis of a midthoracic vertebral body, likely T7. This may be related to degenerative change, however given patient's history of breast cancer, further evaluation with thoracic spine CT is recommended as this could represent metastatic disease. Electronically Signed   By: Aliene Lloyd M.D.   On: 10/16/2024 13:59     Procedures   Medications Ordered in the ED - No data to display                                  Medical Decision Making Amount and/or Complexity of Data Reviewed Labs: ordered. Radiology: ordered.   Patient is presenting with a productive cough and lower oxygen levels in the setting of influenza illness diagnosed about 2 weeks ago.  She is no longer acutely infectious with influenza based on this timeline.  I reviewed her x-ray which shows no obvious or focal infiltrate but question is a sclerotic bony lesion.  I do think given her advanced age and prior history of malignancy, may be reasonable to evaluate for pulmonary embolus,  with CT imaging of the chest.  This would also help us  better evaluate the sclerotic lesion on her bones if there is indeed concern for recurrence of metastasis.  Patient is pending labs and CT imaging at the time of signout to afternoon ED provider at 3 pm.  She is not in any respiratory distress.  I do not hear any wheezing.  She is not requiring nebulizer treatments or airway protection.  Her oxygen level on arrival appears to be within normal limits.  Patient signed out to Dr Lenor EDP     Final diagnoses:  Cough, unspecified type    ED Discharge Orders     None          Cottie Donnice PARAS, MD 10/16/24 1511  "

## 2024-10-16 NOTE — ED Notes (Signed)
 The patient ambulated around the nurses station. She did 3 laps and O2 dropped to 85% - 91% on room air and HR was 110-124 bpm. Dr. Lenor informed.

## 2024-10-16 NOTE — ED Notes (Signed)
 Patient transported to CT

## 2024-10-16 NOTE — ED Notes (Signed)
 The patient ambulated two more laps around the nurses station with independent steady gait. O2 maintained 96-100% on room air and HR maintained 108-111. Dr. Lenor informed.

## 2024-11-01 NOTE — Progress Notes (Signed)
 PCP - Sari Pay, MD [facility] Cardiologist -   PPM/ICD -  Device Orders -  Rep Notified -   Chest x-ray - 10-16-24 epic  CTA chest 10-16-24 epic EKG -  Stress Test -  ECHO -  Cardiac Cath -   Sleep Study -  CPAP -   Fasting Blood Sugar -  Checks Blood Sugar _____ times a day  Blood Thinner Instructions: Aspirin Instructions:  ERAS Protcol - PRE-SURGERY Ensure or G2-    COVID vaccine -  Activity-- Anesthesia review: HTN, CKD   Patient denies shortness of breath, fever, cough and chest pain at PAT appointment   All instructions explained to the patient, with a verbal understanding of the material. Patient agrees to go over the instructions while at home for a better understanding. Patient also instructed to self quarantine after being tested for COVID-19. The opportunity to ask questions was provided.

## 2024-11-01 NOTE — Patient Instructions (Signed)
 SURGICAL WAITING ROOM VISITATION  Patients having surgery or a procedure may have no more than 2 support people in the waiting area - these visitors may rotate.    Children ages 32 and under will not be able to visit patients in [facility] under most circumstances.   Visitors with respiratory illnesses are discouraged from visiting and should remain at home.  If the patient needs to stay at the hospital during part of their recovery, the visitor guidelines for inpatient rooms apply. Pre-op nurse will coordinate an appropriate time for 1 support person to accompany patient in pre-op.  This support person may not rotate.    Please refer to the [facility] website for the visitor guidelines for Inpatients (after your surgery is over and you are in a regular room).       Your procedure is scheduled on: 11-15-24   Report to [facility] Main Entrance    Report to admitting at       12:30 PM   Call this number if you have problems the morning of surgery [phone]   Do not eat food :After Midnight.   After Midnight you may have the following liquids until __12:00____ PM DAY OF SURGERY  then nothing by mouth  Water  Non-Citrus Juices (without pulp, NO RED-Apple, Yanik grape, Lacerte cranberry) Black Coffee (NO MILK/CREAM OR CREAMERS, sugar ok)  Clear Tea (NO MILK/CREAM OR CREAMERS, sugar ok) regular and decaf                             Plain Jell-O (NO RED)                                           Fruit ices (not with fruit pulp, NO RED)                                     Popsicles (NO RED)                                                               Sports drinks like Gatorade (NO RED)                     The day of surgery:  Drink ONE (1) Pre-Surgery Clear Ensure by 12:00 PM the morning of surgery. Drink in one sitting. Do not sip.  This drink was given to you during your hospital  pre-op appointment visit. Nothing else to drink after completing the   Pre-Surgery Clear Ensure .          If you have questions, please contact your surgeons office.   FOLLOW  ANY ADDITIONAL PRE OP INSTRUCTIONS YOU RECEIVED FROM YOUR SURGEON'S OFFICE!!!     Oral Hygiene is also important to reduce your risk of infection.                                    Remember - BRUSH YOUR TEETH THE MORNING OF  SURGERY WITH YOUR REGULAR TOOTHPASTE  DENTURES WILL BE REMOVED PRIOR TO SURGERY PLEASE DO NOT APPLY Poly grip OR ADHESIVES!!!   Do NOT smoke after Midnight   Stop all vitamins and herbal supplements 7 days before surgery.   Take these medicines the morning of surgery with A SIP OF WATER : eye drops as usual, citalopram   DO NOT TAKE ANY ORAL DIABETIC MEDICATIONS DAY OF YOUR SURGERY  Bring CPAP mask and tubing day of surgery.                              You may not have any metal on your body including hair pins, jewelry, and body piercing             Do not wear make-up, lotions, powders, perfumes/cologne, or deodorant  Do not wear nail polish including gel and S&S, artificial/acrylic nails, or any other type of covering on natural nails including finger and toenails. If you have artificial nails, gel coating, etc. that needs to be removed by a nail salon please have this removed prior to surgery or surgery may need to be canceled/ delayed if the surgeon/ anesthesia feels like they are unable to be safely monitored.   Do not shave  4 days prior to surgery.          Do not bring valuables to the hospital. [facility] IS NOT             RESPONSIBLE   FOR VALUABLES.   Contacts, glasses, dentures or bridgework may not be worn into surgery.   Bring small overnight bag day of surgery.   DO NOT BRING YOUR HOME MEDICATIONS TO THE HOSPITAL. PHARMACY WILL DISPENSE MEDICATIONS LISTED ON YOUR MEDICATION LIST TO YOU DURING YOUR ADMISSION IN THE HOSPITAL!    Patients discharged on the day of surgery will not be allowed to drive home.  Someone NEEDS to stay  with you for the first 24 hours after anesthesia.   Special Instructions: Bring a copy of your healthcare power of attorney and living will documents the day of surgery if you haven't scanned them before.              Please read over the following fact sheets you were given: IF YOU HAVE QUESTIONS ABOUT YOUR PRE-OP INSTRUCTIONS PLEASE CALL [phone].   If you test positive for Covid or have been in contact with anyone that has tested positive in the last 10 days please notify you surgeon.      Pre-operative 4 CHG Bath Instructions  DYNA-Hex 4 Chlorhexidine  Gluconate 4% Solution Antiseptic 4 fl. oz   You can play a key role in reducing the risk of infection after surgery. Your skin needs to be as free of germs as possible. You can reduce the number of germs on your skin by washing with CHG (chlorhexidine  gluconate) soap before surgery. CHG is an antiseptic soap that kills germs and continues to kill germs even after washing.   DO NOT use if you have an allergy  to chlorhexidine /CHG or antibacterial soaps. If your skin becomes reddened or irritated, stop using the CHG and notify one of our RNs at   Please shower with the CHG soap starting 4 days before surgery using the following schedule:     Please keep in mind the following:  DO NOT shave, including legs and underarms, starting the day of your first shower.   You may shave your face  at any point before/day of surgery.  Place clean sheets on your bed the day you start using CHG soap. Use a clean washcloth (not used since being washed) for each shower. DO NOT sleep with pets once you start using the CHG.  CHG Shower Instructions:  If you choose to wash your hair and private area, wash first with your normal shampoo/soap.  After you use shampoo/soap, rinse your hair and body thoroughly to remove shampoo/soap residue.  Turn the water  OFF and apply about 3 tablespoons (45 ml) of CHG soap to a CLEAN washcloth.  Apply CHG soap ONLY FROM YOUR  NECK DOWN TO YOUR TOES (washing for 3-5 minutes)  DO NOT use CHG soap on face, private areas, open wounds, or sores.  Pay special attention to the area where your surgery is being performed.  If you are having back surgery, having someone wash your back for you may be helpful. Wait 2 minutes after CHG soap is applied, then you may rinse off the CHG soap.  Pat dry with a clean towel  Put on clean clothes/pajamas   If you choose to wear lotion, please use ONLY the CHG-compatible lotions on the back of this paper.     Additional instructions for the day of surgery: DO NOT APPLY any lotions, deodorants, cologne, or perfumes.   Put on clean/comfortable clothes.  Brush your teeth.  Ask your nurse before applying any prescription medications to the skin.   CHG Compatible Lotions   Aveeno Moisturizing lotion  Cetaphil Moisturizing Cream  Cetaphil Moisturizing Lotion  Clairol Herbal Essence Moisturizing Lotion, Dry Skin  Clairol Herbal Essence Moisturizing Lotion, Extra Dry Skin  Clairol Herbal Essence Moisturizing Lotion, Normal Skin  Curel Age Defying Therapeutic Moisturizing Lotion with Alpha Hydroxy  Curel Extreme Care Body Lotion  Curel Soothing Hands Moisturizing Hand Lotion  Curel Therapeutic Moisturizing Cream, Fragrance-Free  Curel Therapeutic Moisturizing Lotion, Fragrance-Free  Curel Therapeutic Moisturizing Lotion, Original Formula  Eucerin Daily Replenishing Lotion  Eucerin Dry Skin Therapy Plus Alpha Hydroxy Crme  Eucerin Dry Skin Therapy Plus Alpha Hydroxy Lotion  Eucerin Original Crme  Eucerin Original Lotion  Eucerin Plus Crme Eucerin Plus Lotion  Eucerin TriLipid Replenishing Lotion  Keri Anti-Bacterial Hand Lotion  Keri Deep Conditioning Original Lotion Dry Skin Formula Softly Scented  Keri Deep Conditioning Original Lotion, Fragrance Free Sensitive Skin Formula  Keri Lotion Fast Absorbing Fragrance Free Sensitive Skin Formula  Keri Lotion Fast Absorbing Softly  Scented Dry Skin Formula  Keri Original Lotion  Keri Skin Renewal Lotion Keri Silky Smooth Lotion  Keri Silky Smooth Sensitive Skin Lotion  Nivea Body Creamy Conditioning Oil  Nivea Body Extra Enriched Lotion  Nivea Body Original Lotion  Nivea Body Sheer Moisturizing Lotion Nivea Crme  Nivea Skin Firming Lotion  NutraDerm 30 Skin Lotion  NutraDerm Skin Lotion  NutraDerm Therapeutic Skin Cream  NutraDerm Therapeutic Skin Lotion  ProShield Protective Hand Cream   Incentive Spirometer  An incentive spirometer is a tool that can help keep your lungs clear and active. This tool measures how well you are filling your lungs with each breath. Taking long deep breaths may help reverse or decrease the chance of developing breathing (pulmonary) problems (especially infection) following: A long period of time when you are unable to move or be active. BEFORE THE PROCEDURE  If the spirometer includes an indicator to show your best effort, your nurse or respiratory therapist will set it to a desired goal. If possible, sit up straight or lean slightly  forward. Try not to slouch. Hold the incentive spirometer in an upright position. INSTRUCTIONS FOR USE  Sit on the edge of your bed if possible, or sit up as far as you can in bed or on a chair. Hold the incentive spirometer in an upright position. Breathe out normally. Place the mouthpiece in your mouth and seal your lips tightly around it. Breathe in slowly and as deeply as possible, raising the piston or the ball toward the top of the column. Hold your breath for 3-5 seconds or for as long as possible. Allow the piston or ball to fall to the bottom of the column. Remove the mouthpiece from your mouth and breathe out normally. Rest for a few seconds and repeat Steps 1 through 7 at least 10 times every 1-2 hours when you are awake. Take your time and take a few normal breaths between deep breaths. The spirometer may include an indicator to show your  best effort. Use the indicator as a goal to work toward during each repetition. After each set of 10 deep breaths, practice coughing to be sure your lungs are clear. If you have an incision (the cut made at the time of surgery), support your incision when coughing by placing a pillow or rolled up towels firmly against it. Once you are able to get out of bed, walk around indoors and cough well. You may stop using the incentive spirometer when instructed by your caregiver.  RISKS AND COMPLICATIONS Take your time so you do not get dizzy or light-headed. If you are in pain, you may need to take or ask for pain medication before doing incentive spirometry. It is harder to take a deep breath if you are having pain. AFTER USE Rest and breathe slowly and easily. It can be helpful to keep track of a log of your progress. Your caregiver can provide you with a simple table to help with this. If you are using the spirometer at home, follow these instructions: SEEK MEDICAL CARE IF:  You are having difficultly using the spirometer. You have trouble using the spirometer as often as instructed. Your pain medication is not giving enough relief while using the spirometer. You develop fever of 100.5 F (38.1 C) or higher. SEEK IMMEDIATE MEDICAL CARE IF:  You cough up bloody sputum that had not been present before. You develop fever of 102 F (38.9 C) or greater. You develop worsening pain at or near the incision site. MAKE SURE YOU:  Understand these instructions. Will watch your condition. Will get help right away if you are not doing well or get worse. Document Released: 02/17/2007 Document Revised: 12/30/2011 Document Reviewed: 04/20/2007 [facility] Patient Information 2014 [place], [place].   ________________________________________________________________________

## 2024-11-03 ENCOUNTER — Other Ambulatory Visit: Payer: Self-pay

## 2024-11-03 ENCOUNTER — Encounter (HOSPITAL_COMMUNITY)
Admission: RE | Admit: 2024-11-03 | Discharge: 2024-11-03 | Disposition: A | Discharge status: Home / Self Care | Source: Ambulatory Visit | Attending: Orthopedic Surgery | Admitting: Orthopedic Surgery

## 2024-11-03 ENCOUNTER — Encounter (HOSPITAL_COMMUNITY): Payer: Self-pay

## 2024-11-03 VITALS — BP 125/81 | HR 74 | Temp 98.1°F | Resp 16 | Ht 65.0 in | Wt 161.0 lb

## 2024-11-03 DIAGNOSIS — Z01818 Encounter for other preprocedural examination: Secondary | ICD-10-CM | POA: Insufficient documentation

## 2024-11-03 DIAGNOSIS — I10 Essential (primary) hypertension: Secondary | ICD-10-CM | POA: Insufficient documentation

## 2024-11-03 HISTORY — DX: Obsessive-compulsive disorder, unspecified: F42.9

## 2024-11-03 HISTORY — DX: Cardiac arrhythmia, unspecified: I49.9

## 2024-11-03 LAB — BASIC METABOLIC PANEL WITH GFR
Anion gap: 10 (ref 5–15)
BUN: 13 mg/dL (ref 8–23)
CO2: 27 mmol/L (ref 22–32)
Calcium: 10.4 mg/dL — ABNORMAL HIGH (ref 8.9–10.3)
Chloride: 97 mmol/L — ABNORMAL LOW (ref 98–111)
Creatinine, Ser: 0.79 mg/dL (ref 0.44–1.00)
GFR, Estimated: >60 mL/min
Glucose, Bld: 105 mg/dL — ABNORMAL HIGH (ref 70–99)
Potassium: 4.6 mmol/L (ref 3.5–5.1)
Sodium: 134 mmol/L — ABNORMAL LOW (ref 135–145)

## 2024-11-03 LAB — CBC
HCT: 35.7 % — ABNORMAL LOW (ref 36.0–46.0)
Hemoglobin: 12.5 g/dL (ref 12.0–15.0)
MCH: 34.1 pg — ABNORMAL HIGH (ref 26.0–34.0)
MCHC: 35 g/dL (ref 30.0–36.0)
MCV: 97.3 fL (ref 80.0–100.0)
Platelets: 322 K/uL (ref 150–400)
RBC: 3.67 MIL/uL — ABNORMAL LOW (ref 3.87–5.11)
RDW: 14.6 % (ref 11.5–15.5)
WBC: 6.6 K/uL (ref 4.0–10.5)
nRBC: 0 % (ref 0.0–0.2)

## 2024-11-03 LAB — SURGICAL PCR SCREEN
MRSA, PCR: NEGATIVE
Staphylococcus aureus: NEGATIVE

## 2024-11-15 ENCOUNTER — Encounter (HOSPITAL_COMMUNITY): Admission: RE | Disposition: A | Payer: Self-pay | Source: Ambulatory Visit | Attending: Orthopedic Surgery

## 2024-11-15 ENCOUNTER — Other Ambulatory Visit: Payer: Self-pay

## 2024-11-15 ENCOUNTER — Ambulatory Visit (HOSPITAL_COMMUNITY): Admitting: Certified Registered Nurse Anesthetist

## 2024-11-15 ENCOUNTER — Encounter (HOSPITAL_COMMUNITY): Payer: Self-pay | Admitting: Orthopedic Surgery

## 2024-11-15 ENCOUNTER — Ambulatory Visit (HOSPITAL_COMMUNITY): Payer: Self-pay | Admitting: Medical

## 2024-11-15 ENCOUNTER — Observation Stay (HOSPITAL_COMMUNITY)
Admission: RE | Admit: 2024-11-15 | Discharge: 2024-11-16 | Disposition: A | Discharge status: Home / Self Care | Source: Ambulatory Visit | Attending: Orthopedic Surgery | Admitting: Orthopedic Surgery

## 2024-11-15 DIAGNOSIS — M1711 Unilateral primary osteoarthritis, right knee: Secondary | ICD-10-CM

## 2024-11-15 DIAGNOSIS — Z79899 Other long term (current) drug therapy: Secondary | ICD-10-CM | POA: Diagnosis not present

## 2024-11-15 DIAGNOSIS — Z853 Personal history of malignant neoplasm of breast: Secondary | ICD-10-CM | POA: Diagnosis not present

## 2024-11-15 DIAGNOSIS — M25561 Pain in right knee: Secondary | ICD-10-CM | POA: Diagnosis present

## 2024-11-15 DIAGNOSIS — Z96652 Presence of left artificial knee joint: Secondary | ICD-10-CM | POA: Diagnosis not present

## 2024-11-15 DIAGNOSIS — I10 Essential (primary) hypertension: Secondary | ICD-10-CM | POA: Diagnosis not present

## 2024-11-15 DIAGNOSIS — Z96642 Presence of left artificial hip joint: Secondary | ICD-10-CM | POA: Insufficient documentation

## 2024-11-15 DIAGNOSIS — N1831 Chronic kidney disease, stage 3a: Secondary | ICD-10-CM | POA: Diagnosis not present

## 2024-11-15 DIAGNOSIS — Z91040 Latex allergy status: Secondary | ICD-10-CM | POA: Diagnosis not present

## 2024-11-15 DIAGNOSIS — I129 Hypertensive chronic kidney disease with stage 1 through stage 4 chronic kidney disease, or unspecified chronic kidney disease: Secondary | ICD-10-CM | POA: Insufficient documentation

## 2024-11-15 DIAGNOSIS — M24661 Ankylosis, right knee: Principal | ICD-10-CM | POA: Diagnosis present

## 2024-11-15 DIAGNOSIS — M1712 Unilateral primary osteoarthritis, left knee: Principal | ICD-10-CM

## 2024-11-15 MED ORDER — MIDAZOLAM HCL (PF) 2 MG/2ML IJ SOLN: 1.0000 mg | Freq: Once | INTRAMUSCULAR | Status: DC

## 2024-11-15 MED ORDER — METOCLOPRAMIDE HCL 5 MG/ML IJ SOLN: 5.0000 mg | Freq: Three times a day (TID) | INTRAMUSCULAR | Status: DC | PRN

## 2024-11-15 MED ORDER — CHLORHEXIDINE GLUCONATE 0.12 % MT SOLN
15.0000 mL | Freq: Once | OROMUCOSAL | Status: AC
  Administered 2024-11-15: 15 mL via OROMUCOSAL

## 2024-11-15 MED ORDER — DIPHENHYDRAMINE HCL 12.5 MG/5ML PO ELIX: 12.5000 mg | ORAL_SOLUTION | ORAL | Status: DC | PRN

## 2024-11-15 MED ORDER — BISACODYL 10 MG RE SUPP: 10.0000 mg | Freq: Every day | RECTAL | Status: DC | PRN

## 2024-11-15 MED ORDER — ONDANSETRON HCL 4 MG PO TABS: 4.0000 mg | ORAL_TABLET | Freq: Four times a day (QID) | ORAL | Status: DC | PRN

## 2024-11-15 MED ORDER — CEFAZOLIN SODIUM-DEXTROSE 2-4 GM/100ML-% IV SOLN
2.0000 g | Freq: Four times a day (QID) | INTRAVENOUS | Status: AC
  Administered 2024-11-15 (×2): 2 g via INTRAVENOUS
  Filled 2024-11-15 (×2): qty 100

## 2024-11-15 MED ORDER — ONDANSETRON HCL 4 MG/2ML IJ SOLN: 4.0000 mg | Freq: Four times a day (QID) | INTRAMUSCULAR | Status: DC | PRN

## 2024-11-15 MED ORDER — PHENYLEPHRINE HCL-NACL 20-0.9 MG/250ML-% IV SOLN
INTRAVENOUS | Status: DC | PRN
  Administered 2024-11-15: 40 ug/min via INTRAVENOUS

## 2024-11-15 MED ORDER — FLEET ENEMA RE ENEM: 1.0000 | ENEMA | Freq: Once | RECTAL | Status: DC | PRN

## 2024-11-15 MED ORDER — LACTATED RINGERS IV SOLN: INTRAVENOUS | Status: DC

## 2024-11-15 MED ORDER — BUPIVACAINE IN DEXTROSE 0.75-8.25 % IT SOLN
INTRATHECAL | Status: DC | PRN
  Administered 2024-11-15: 1.4 mL via INTRATHECAL

## 2024-11-15 MED ORDER — CITALOPRAM HYDROBROMIDE 20 MG PO TABS
20.0000 mg | ORAL_TABLET | Freq: Every day | ORAL | Status: DC
  Administered 2024-11-16: 20 mg via ORAL
  Filled 2024-11-15: qty 1

## 2024-11-15 MED ORDER — ACETAMINOPHEN 325 MG PO TABS: 325.0000 mg | ORAL_TABLET | Freq: Four times a day (QID) | ORAL | Status: DC | PRN

## 2024-11-15 MED ORDER — SODIUM CHLORIDE 0.9 % IV SOLN: INTRAVENOUS | Status: DC

## 2024-11-15 MED ORDER — HYDROMORPHONE HCL 1 MG/ML IJ SOLN
0.5000 mg | INTRAMUSCULAR | Status: DC | PRN
  Administered 2024-11-15: 1 mg via INTRAVENOUS
  Administered 2024-11-16: 0.5 mg via INTRAVENOUS
  Filled 2024-11-15 (×2): qty 1

## 2024-11-15 MED ORDER — SODIUM CHLORIDE (PF) 0.9 % IJ SOLN
INTRAMUSCULAR | Status: AC
  Filled 2024-11-15: qty 50

## 2024-11-15 MED ORDER — SODIUM CHLORIDE 0.9 % IV BOLUS
500.0000 mL | Freq: Once | INTRAVENOUS | Status: AC
  Administered 2024-11-15: 500 mL via INTRAVENOUS

## 2024-11-15 MED ORDER — OXYCODONE HCL 5 MG/5ML PO SOLN: 5.0000 mg | Freq: Once | ORAL | Status: AC | PRN

## 2024-11-15 MED ORDER — CEFAZOLIN SODIUM-DEXTROSE 2-4 GM/100ML-% IV SOLN
2.0000 g | INTRAVENOUS | Status: AC
  Administered 2024-11-15: 2 g via INTRAVENOUS
  Filled 2024-11-15: qty 100

## 2024-11-15 MED ORDER — OXYCODONE HCL 5 MG PO TABS
5.0000 mg | ORAL_TABLET | ORAL | Status: DC | PRN
  Administered 2024-11-15: 10 mg via ORAL
  Administered 2024-11-15: 5 mg via ORAL
  Administered 2024-11-16: 10 mg via ORAL
  Filled 2024-11-15 (×3): qty 2

## 2024-11-15 MED ORDER — DEXAMETHASONE SOD PHOSPHATE PF 10 MG/ML IJ SOLN
10.0000 mg | Freq: Once | INTRAMUSCULAR | Status: AC
  Administered 2024-11-16: 10 mg via INTRAVENOUS
  Filled 2024-11-15: qty 1

## 2024-11-15 MED ORDER — PROPOFOL 1000 MG/100ML IV EMUL
INTRAVENOUS | Status: AC
  Filled 2024-11-15: qty 100

## 2024-11-15 MED ORDER — 0.9 % SODIUM CHLORIDE (POUR BTL) OPTIME
TOPICAL | Status: DC | PRN
  Administered 2024-11-15: 1000 mL

## 2024-11-15 MED ORDER — DEXAMETHASONE SOD PHOSPHATE PF 10 MG/ML IJ SOLN
INTRAMUSCULAR | Status: AC
  Filled 2024-11-15: qty 1

## 2024-11-15 MED ORDER — BUPIVACAINE LIPOSOME 1.3 % IJ SUSP: 20.0000 mL | Freq: Once | INTRAMUSCULAR | Status: DC

## 2024-11-15 MED ORDER — BUPIVACAINE LIPOSOME 1.3 % IJ SUSP
INTRAMUSCULAR | Status: AC
  Filled 2024-11-15: qty 20

## 2024-11-15 MED ORDER — ACETAMINOPHEN 10 MG/ML IV SOLN
1000.0000 mg | Freq: Four times a day (QID) | INTRAVENOUS | Status: DC
  Administered 2024-11-15: 1000 mg via INTRAVENOUS
  Filled 2024-11-15: qty 100

## 2024-11-15 MED ORDER — METOCLOPRAMIDE HCL 5 MG PO TABS: 5.0000 mg | ORAL_TABLET | Freq: Three times a day (TID) | ORAL | Status: DC | PRN

## 2024-11-15 MED ORDER — AMLODIPINE BESYLATE 5 MG PO TABS: 2.5000 mg | ORAL_TABLET | Freq: Every evening | ORAL | Status: DC

## 2024-11-15 MED ORDER — DOCUSATE SODIUM 100 MG PO CAPS
100.0000 mg | ORAL_CAPSULE | Freq: Two times a day (BID) | ORAL | Status: DC
  Administered 2024-11-15 – 2024-11-16 (×2): 100 mg via ORAL
  Filled 2024-11-15 (×3): qty 1

## 2024-11-15 MED ORDER — DEXAMETHASONE SOD PHOSPHATE PF 10 MG/ML IJ SOLN
INTRAMUSCULAR | Status: DC | PRN
  Administered 2024-11-15: 8 mg via INTRAVENOUS

## 2024-11-15 MED ORDER — ACETAMINOPHEN 500 MG PO TABS
1000.0000 mg | ORAL_TABLET | Freq: Four times a day (QID) | ORAL | Status: AC
  Administered 2024-11-15 – 2024-11-16 (×4): 1000 mg via ORAL
  Filled 2024-11-15 (×4): qty 2

## 2024-11-15 MED ORDER — MENTHOL 3 MG MT LOZG: 1.0000 | LOZENGE | OROMUCOSAL | Status: DC | PRN

## 2024-11-15 MED ORDER — METHOCARBAMOL 500 MG PO TABS
500.0000 mg | ORAL_TABLET | Freq: Four times a day (QID) | ORAL | Status: DC | PRN
  Administered 2024-11-15 (×2): 500 mg via ORAL
  Filled 2024-11-15 (×2): qty 1

## 2024-11-15 MED ORDER — PHENOL 1.4 % MT LIQD: 1.0000 | OROMUCOSAL | Status: DC | PRN

## 2024-11-15 MED ORDER — TIMOLOL MALEATE 0.25 % OP SOLN
1.0000 [drp] | Freq: Two times a day (BID) | OPHTHALMIC | Status: DC
  Administered 2024-11-15 – 2024-11-16 (×2): 1 [drp] via OPHTHALMIC
  Filled 2024-11-15: qty 5

## 2024-11-15 MED ORDER — LORATADINE 10 MG PO TABS
10.0000 mg | ORAL_TABLET | Freq: Every day | ORAL | Status: DC
  Administered 2024-11-15: 10 mg via ORAL
  Filled 2024-11-15: qty 1

## 2024-11-15 MED ORDER — POLYETHYLENE GLYCOL 3350 17 G PO PACK: 17.0000 g | PACK | Freq: Every day | ORAL | Status: DC | PRN

## 2024-11-15 MED ORDER — ATORVASTATIN CALCIUM 20 MG PO TABS
20.0000 mg | ORAL_TABLET | Freq: Every day | ORAL | Status: DC
  Administered 2024-11-15: 20 mg via ORAL
  Filled 2024-11-15: qty 1

## 2024-11-15 MED ORDER — AMISULPRIDE (ANTIEMETIC) 5 MG/2ML IV SOLN: 10.0000 mg | Freq: Once | INTRAVENOUS | Status: DC | PRN

## 2024-11-15 MED ORDER — ROPIVACAINE HCL 5 MG/ML IJ SOLN
INTRAMUSCULAR | Status: DC | PRN
  Administered 2024-11-15: 20 mL via PERINEURAL

## 2024-11-15 MED ORDER — POVIDONE-IODINE 10 % EX SWAB: 2.0000 | Freq: Once | CUTANEOUS | Status: DC

## 2024-11-15 MED ORDER — ORAL CARE MOUTH RINSE: 15.0000 mL | Freq: Once | OROMUCOSAL | Status: AC

## 2024-11-15 MED ORDER — LIDOCAINE HCL (CARDIAC) PF 100 MG/5ML IV SOSY
PREFILLED_SYRINGE | INTRAVENOUS | Status: DC | PRN
  Administered 2024-11-15: 50 mg via INTRAVENOUS

## 2024-11-15 MED ORDER — OXYCODONE HCL 5 MG PO TABS
5.0000 mg | ORAL_TABLET | Freq: Once | ORAL | Status: AC | PRN
  Administered 2024-11-15: 5 mg via ORAL

## 2024-11-15 MED ORDER — FENTANYL CITRATE (PF) 50 MCG/ML IJ SOSY
50.0000 ug | PREFILLED_SYRINGE | Freq: Once | INTRAMUSCULAR | Status: AC
  Administered 2024-11-15: 50 ug via INTRAVENOUS
  Filled 2024-11-15: qty 1

## 2024-11-15 MED ORDER — METHOCARBAMOL 1000 MG/10ML IJ SOLN: 500.0000 mg | Freq: Four times a day (QID) | INTRAMUSCULAR | Status: DC | PRN

## 2024-11-15 MED ORDER — FLUTICASONE PROPIONATE 50 MCG/ACT NA SUSP: 1.0000 | Freq: Every day | NASAL | Status: DC | PRN

## 2024-11-15 MED ORDER — SODIUM CHLORIDE 0.9 % IR SOLN
Status: DC | PRN
  Administered 2024-11-15: 1000 mL

## 2024-11-15 MED ORDER — CLONIDINE HCL (ANALGESIA) 100 MCG/ML EP SOLN
EPIDURAL | Status: DC | PRN
  Administered 2024-11-15: 50 ug

## 2024-11-15 MED ORDER — ONDANSETRON HCL 4 MG/2ML IJ SOLN
INTRAMUSCULAR | Status: DC | PRN
  Administered 2024-11-15: 4 mg via INTRAVENOUS

## 2024-11-15 MED ORDER — TRAMADOL HCL 50 MG PO TABS
50.0000 mg | ORAL_TABLET | Freq: Four times a day (QID) | ORAL | Status: DC | PRN
  Administered 2024-11-15: 100 mg via ORAL
  Filled 2024-11-15: qty 2

## 2024-11-15 MED ORDER — TRANEXAMIC ACID-NACL 1000-0.7 MG/100ML-% IV SOLN
1000.0000 mg | INTRAVENOUS | Status: AC
  Administered 2024-11-15: 1000 mg via INTRAVENOUS
  Filled 2024-11-15: qty 100

## 2024-11-15 MED ORDER — DEXAMETHASONE SOD PHOSPHATE PF 10 MG/ML IJ SOLN: 8.0000 mg | Freq: Once | INTRAMUSCULAR | Status: DC

## 2024-11-15 MED ORDER — FENTANYL CITRATE (PF) 50 MCG/ML IJ SOSY: 25.0000 ug | PREFILLED_SYRINGE | INTRAMUSCULAR | Status: DC | PRN

## 2024-11-15 MED ORDER — OXYCODONE HCL 5 MG PO TABS
ORAL_TABLET | ORAL | Status: AC
  Filled 2024-11-15: qty 1

## 2024-11-15 MED ORDER — RIVAROXABAN 10 MG PO TABS
10.0000 mg | ORAL_TABLET | Freq: Every day | ORAL | Status: DC
  Administered 2024-11-16: 10 mg via ORAL
  Filled 2024-11-15: qty 1

## 2024-11-15 MED ORDER — LOSARTAN POTASSIUM 50 MG PO TABS: 50.0000 mg | ORAL_TABLET | Freq: Every evening | ORAL | Status: DC

## 2024-11-15 MED ORDER — PROPOFOL 10 MG/ML IV BOLUS
INTRAVENOUS | Status: DC | PRN
  Administered 2024-11-15: 20 mg via INTRAVENOUS
  Administered 2024-11-15: 30 mg via INTRAVENOUS
  Administered 2024-11-15: 85 ug/kg/min via INTRAVENOUS

## 2024-11-15 MED ORDER — SODIUM CHLORIDE 0.9 % IV SOLN
INTRAVENOUS | Status: DC | PRN
  Administered 2024-11-15: 80 mL

## 2024-11-15 MED ORDER — ONDANSETRON HCL 4 MG/2ML IJ SOLN
INTRAMUSCULAR | Status: AC
  Filled 2024-11-15: qty 2

## 2024-11-15 MED ORDER — SODIUM CHLORIDE (PF) 0.9 % IJ SOLN
INTRAMUSCULAR | Status: AC
  Filled 2024-11-15: qty 10

## 2024-11-15 NOTE — Anesthesia Preprocedure Evaluation (Signed)
"                                    Anesthesia Evaluation  Patient identified by MRN, date of birth, ID band Patient awake    Reviewed: Allergy  & Precautions, NPO status , Patient's Chart, lab work & pertinent test results  Airway Mallampati: II  TM Distance: >3 FB Neck ROM: Full    Dental   Pulmonary neg pulmonary ROS   Pulmonary exam normal        Cardiovascular hypertension, Pt. on medications Normal cardiovascular exam     Neuro/Psych negative neurological ROS     GI/Hepatic Neg liver ROS, hiatal hernia,GERD  ,,  Endo/Other  negative endocrine ROS    Renal/GU Renal disease     Musculoskeletal  (+) Arthritis ,    Abdominal   Peds  Hematology negative hematology ROS (+)   Anesthesia Other Findings   Reproductive/Obstetrics                              Lab Results  Component Value Date   WBC 6.6 11/03/2024   HGB 12.5 11/03/2024   HCT 35.7 (L) 11/03/2024   MCV 97.3 11/03/2024   PLT 322 11/03/2024   Lab Results  Component Value Date   NA 134 (L) 11/03/2024   CL 97 (L) 11/03/2024   K 4.6 11/03/2024   CO2 27 11/03/2024   BUN 13 11/03/2024   CREATININE 0.79 11/03/2024   GFRNONAA >60 11/03/2024   CALCIUM  10.4 (H) 11/03/2024   ALBUMIN 3.8 07/10/2021   GLUCOSE 105 (H) 11/03/2024    Anesthesia Physical Anesthesia Plan  ASA: 3  Anesthesia Plan: Spinal   Post-op Pain Management: Regional block* and Ofirmev  IV (intra-op)*   Induction:   PONV Risk Score and Plan: 2 and Propofol  infusion, Dexamethasone  and Ondansetron   Airway Management Planned: Natural Airway and Simple Face Mask  Additional Equipment:   Intra-op Plan:   Post-operative Plan:   Informed Consent: I have reviewed the patients History and Physical, chart, labs and discussed the procedure including the risks, benefits and alternatives for the proposed anesthesia with the patient or authorized representative who has indicated his/her  understanding and acceptance.     Dental advisory given  Plan Discussed with: CRNA  Anesthesia Plan Comments:          Anesthesia Quick Evaluation  "

## 2024-11-15 NOTE — Progress Notes (Signed)
 Orthopedic Tech Progress Note Patient Details:  Morgan Elliott Mar 05, 1946 [id number] Applied CPM per order. Will remove at 5:26 pm. CPM Right Knee CPM Right Knee: On Right Knee Flexion (Degrees): 40 Right Knee Extension (Degrees): 10  Post Interventions Patient Tolerated: Well Instructions Provided: Adjustment of device, Care of device, Poper ambulation with device Ortho Devices Type of Ortho Device: CPM padding Ortho Device/Splint Location: RLE Ortho Device/Splint Interventions: Application, Adjustment, Ordered   Post Interventions Patient Tolerated: Well Instructions Provided: Adjustment of device, Care of device, Poper ambulation with device  [name] 11/15/2024, 5:24 PM

## 2024-11-15 NOTE — Progress Notes (Signed)
 Patient worked with PT, after PT session pt c/o dizziness while sitting in chair. Patients blood pressure was 85/56. On call paged. per on call PA verbal order for 500 cc NS bolus and hold morning blood pressure medications if blood pressure is low. Patient sitting in chair with call bell at reach and chair alarm on. Patient updated on care of plan. Patient is alert and stable. Will continue plan of care.

## 2024-11-15 NOTE — Care Plan (Signed)
 Ortho Bundle Case Management Note  Patient Details  Name: Morgan Elliott MRN: [id number] Date of Birth: 03-15-1946  R TKA on 11/15/24  DCP: Home with husband  DME: No needs. Has RW  PT: Izell Huron PT                   DME Arranged:  N/A DME Agency:  NA  HH Arranged:    HH Agency:     Additional Comments: Please contact me with any questions of if this plan should need to change.  Lyle Pepper, CCM [facility] [phone]  Ext. [phone]   11/15/2024, 9:40 AM

## 2024-11-15 NOTE — Anesthesia Procedure Notes (Signed)
 Anesthesia Regional Block: Adductor canal block   Pre-Anesthetic Checklist: , timeout performed,  Correct Patient, Correct Site, Correct Laterality,  Correct Procedure, Correct Position, site marked,  Risks and benefits discussed,  Surgical consent,  Pre-op evaluation,  At surgeon's request and post-op pain management  Laterality: Right  Prep: chloraprep       Needles:  Injection technique: Single-shot  Needle Type: Echogenic Needle     Needle Length: 9cm  Needle Gauge: 21     Additional Needles:   Procedures:,,,, ultrasound used (permanent image in chart),,    Narrative:  Start time: 11/15/2024 10:33 AM End time: 11/15/2024 10:38 AM Injection made incrementally with aspirations every 5 mL.  Performed by: Personally  Anesthesiologist: Epifanio Charleston, MD

## 2024-11-15 NOTE — Op Note (Signed)
 "    OPERATIVE REPORT-TOTAL KNEE ARTHROPLASTY   Pre-operative diagnosis- Osteoarthritis  Right knee(s)  Post-operative diagnosis- Osteoarthritis Right knee(s)  Procedure-  Right  Total Knee Arthroplasty  Surgeon- Dempsey GAILS. Yaire Kreher, MD  Assistant- Waddell Sor, PA-C   Anesthesia-  Adductor canal block and spinal  EBL- 25 ml   Drains None  Tourniquet time-  Total Tourniquet Time Documented: Thigh (Right) - 40 minutes Total: Thigh (Right) - 40 minutes     Complications- None  Condition-PACU - hemodynamically stable.   Brief Clinical Note  Morgan Elliott is a 79 y.o. year old female with end stage OA of her right knee with progressively worsening pain and dysfunction. She has constant pain, with activity and at rest and significant functional deficits with difficulties even with ADLs. She has had extensive non-op management including analgesics, injections of cortisone and viscosupplements, and home exercise program, but remains in significant pain with significant dysfunction.Radiographs show bone on bone arthritis lateral and patellofemoral. She presents now for right Total Knee Arthroplasty.     Procedure in detail---   The patient is brought into the operating room and positioned supine on the operating table. After successful administration of  Adductor canal block and spinal,   a tourniquet is placed high on the  Right thigh(s) and the lower extremity is prepped and draped in the usual sterile fashion. Time out is performed by the operating team and then the  Right lower extremity is wrapped in Esmarch, knee flexed and the tourniquet inflated to 300 mmHg.       A midline incision is made with a ten blade through the subcutaneous tissue to the level of the extensor mechanism. A fresh blade is used to make a medial parapatellar arthrotomy. Soft tissue over the proximal medial tibia is subperiosteally elevated to the joint line with a knife and into the semimembranosus bursa with a  Cobb elevator. Soft tissue over the proximal lateral tibia is elevated with attention being paid to avoiding the patellar tendon on the tibial tubercle. The patella is everted, knee flexed 90 degrees and the ACL and PCL are removed. Findings are bone on bone lateral and patellofemoral with large global osteophytes        The drill is used to create a starting hole in the distal femur and the canal is thoroughly irrigated with sterile saline to remove the fatty contents. The 5 degree Right  valgus alignment guide is placed into the femoral canal and the distal femoral cutting block is pinned to remove 10 mm off the distal femur. Resection is made with an oscillating saw.      The tibia is subluxed forward and the menisci are removed. The extramedullary alignment guide is placed referencing proximally at the medial aspect of the tibial tubercle and distally along the second metatarsal axis and tibial crest. The block is pinned to remove 2mm off the more deficient lateral  side. Resection is made with an oscillating saw. Size E is the most appropriate size for the tibia and the proximal tibia is prepared with the modular drill and keel punch for that size.      The femoral sizing guide is placed and size 7 is most appropriate. Rotation is marked off the epicondylar axis and confirmed by creating a rectangular flexion gap at 90 degrees. The size 7 cutting block is pinned in this rotation and the anterior, posterior and chamfer cuts are made with the oscillating saw. The intercondylar block is then placed  and that cut is made.      Trial size E tibial component, trial size 7 posterior stabilized femur and a 10  mm posterior stabilized fixed bearing insert trial is placed. Full extension is achieved with excellent varus/valgus and anterior/posterior balance throughout full range of motion. The patella is everted and thickness measured to be 22  mm. Free hand resection is taken to 12 mm, a 32 template is placed, lug  holes are drilled, trial patella is placed, and it tracks normally. Osteophytes are removed off the posterior femur with the trial in place. All trials are removed and the cut bone surfaces prepared with pulsatile lavage. Cement is mixed and once ready for implantation, the size E tibial implant, size  7 posterior stabilized femoral component, and the size 32 patella are cemented in place and the patella is held with the clamp. The trial insert is placed and the knee held in full extension. The Exparel  (20 ml mixed with 60 ml saline) is injected into the extensor mechanism, posterior capsule, medial and lateral gutters and subcutaneous tissues.  All extruded cement is removed and once the cement is hard the permanent 10 mm posterior stabilized fixed bearing insert is placed into the tibial tray.      The wound is copiously irrigated with saline solution and the extensor mechanism closed with # 0 Stratofix suture. The tourniquet is released for a total tourniquet time of 40  minutes. Flexion against gravity is 140 degrees and the patella tracks normally. Subcutaneous tissue is closed with 2.0 vicryl and subcuticular with running 4.0 Monocryl. The incision is cleaned and dried and steri-strips and a bulky sterile dressing are applied. The limb is placed into a knee immobilizer and the patient is awakened and transported to recovery in stable condition.      Please note that a surgical assistant was a medical necessity for this procedure in order to perform it in a safe and expeditious manner. Surgical assistant was necessary to retract the ligaments and vital neurovascular structures to prevent injury to them and also necessary for proper positioning of the limb to allow for anatomic placement of the prosthesis.   Dempsey ROCKFORD Naava Janeway, MD    11/15/2024, 12:38 PM   "

## 2024-11-15 NOTE — Discharge Instructions (Addendum)
 Frank Aluisio, MD Total Joint Specialist [facility] [place] [address] [place], [place] [address] [phone]  TOTAL KNEE REPLACEMENT POSTOPERATIVE DIRECTIONS    Knee Rehabilitation, Guidelines Following Surgery  Results after knee surgery are often greatly improved when you follow the exercise, range of motion and muscle strengthening exercises prescribed by your doctor. Safety measures are also important to protect the knee from further injury. If any of these exercises cause you to have increased pain or swelling in your knee joint, decrease the amount until you are comfortable again and slowly increase them. If you have problems or questions, call your caregiver or physical therapist for advice.   BLOOD CLOT PREVENTION Take a 10 mg Xarelto once a day for three weeks following surgery. Then take an 81 mg Aspirin once a day for three weeks. Then discontinue Aspirin. You may resume your vitamins/supplements once you have discontinued the Xarelto. Do not take any NSAIDs (Advil, Aleve, Ibuprofen, Meloxicam, etc.) until you have discontinued the Xarelto.   HOME CARE INSTRUCTIONS  Remove items at home which could result in a fall. This includes throw rugs or furniture in walking pathways.  ICE to the affected knee as much as tolerated. Icing helps control swelling. If the swelling is well controlled you will be more comfortable and rehab easier. Continue to use ice on the knee for pain and swelling from surgery. You may notice swelling that will progress down to the foot and ankle. This is normal after surgery. Elevate the leg when you are not up walking on it.    Continue to use the breathing machine which will help keep your temperature down. It is common for your temperature to cycle up and down following surgery, especially at night when you are not up moving around and exerting yourself. The breathing machine keeps your lungs expanded and your temperature  down. Do not place pillow under the operative knee, focus on keeping the knee straight while resting  DIET You may resume your previous home diet once you are discharged from the hospital.  DRESSING / WOUND CARE / SHOWERING Keep your bulky bandage on for 2 days. On the third post-operative day you may remove the Ace bandage and gauze. There is a waterproof adhesive bandage on your skin which will stay in place until your first follow-up appointment. Once you remove this you will not need to place another bandage You may begin showering 3 days following surgery, but do not submerge the incision under water.  ACTIVITY For the first 5 days, the key is rest and control of pain and swelling Do your home exercises twice a day starting on post-operative day 3. On the days you go to physical therapy, just do the home exercises once that day. You should rest, ice and elevate the leg for 50 minutes out of every hour. Get up and walk/stretch for 10 minutes per hour. After 5 days you can increase your activity slowly as tolerated. Walk with your walker as instructed. Use the walker until you are comfortable transitioning to a cane. Walk with the cane in the opposite hand of the operative leg. You may discontinue the cane once you are comfortable and walking steadily. Avoid periods of inactivity such as sitting longer than an hour when not asleep. This helps prevent blood clots.  You may discontinue the knee immobilizer once you are able to perform a straight leg raise while lying down. You may resume a sexual relationship in one month or   when given the OK by your doctor.  You may return to work once you are cleared by your doctor.  Do not drive a car for 6 weeks or until released by your surgeon.  Do not drive while taking narcotics.  TED HOSE STOCKINGS Wear the elastic stockings on both legs for three weeks following surgery during the day. You may remove them at night for sleeping.  WEIGHT  BEARING Weight bearing as tolerated with assist device (walker, cane, etc) as directed, use it as long as suggested by your surgeon or therapist, typically at least 4-6 weeks.  POSTOPERATIVE CONSTIPATION PROTOCOL Constipation - defined medically as fewer than three stools per week and severe constipation as less than one stool per week.  One of the most common issues patients have following surgery is constipation.  Even if you have a regular bowel pattern at home, your normal regimen is likely to be disrupted due to multiple reasons following surgery.  Combination of anesthesia, postoperative narcotics, change in appetite and fluid intake all can affect your bowels.  In order to avoid complications following surgery, here are some recommendations in order to help you during your recovery period.  Colace (docusate) - Pick up an over-the-counter form of Colace or another stool softener and take twice a day as long as you are requiring postoperative pain medications.  Take with a full glass of water daily.  If you experience loose stools or diarrhea, hold the colace until you stool forms back up. If your symptoms do not get better within 1 week or if they get worse, check with your doctor. Dulcolax (bisacodyl) - Pick up over-the-counter and take as directed by the product packaging as needed to assist with the movement of your bowels.  Take with a full glass of water.  Use this product as needed if not relieved by Colace only.  MiraLax (polyethylene glycol) - Pick up over-the-counter to have on hand. MiraLax is a solution that will increase the amount of water in your bowels to assist with bowel movements.  Take as directed and can mix with a glass of water, juice, soda, coffee, or tea. Take if you go more than two days without a movement. Do not use MiraLax more than once per day. Call your doctor if you are still constipated or irregular after using this medication for 7 days in a row.  If you continue  to have problems with postoperative constipation, please contact the office for further assistance and recommendations.  If you experience "the worst abdominal pain ever" or develop nausea or vomiting, please contact the office immediatly for further recommendations for treatment.  ITCHING If you experience itching with your medications, try taking only a single pain pill, or even half a pain pill at a time.  You can also use Benadryl over the counter for itching or also to help with sleep.   MEDICATIONS See your medication summary on the "After Visit Summary" that the nursing staff will review with you prior to discharge.  You may have some home medications which will be placed on hold until you complete the course of blood thinner medication.  It is important for you to complete the blood thinner medication as prescribed by your surgeon.  Continue your approved medications as instructed at time of discharge.  PRECAUTIONS If you experience chest pain or shortness of breath - call 911 immediately for transfer to the hospital emergency department.  If you develop a fever greater that 101 F, purulent   drainage from wound, increased redness or drainage from wound, foul odor from the wound/dressing, or calf pain - CONTACT YOUR SURGEON.                                                   FOLLOW-UP APPOINTMENTS Make sure you keep all of your appointments after your operation with your surgeon and caregivers. You should call the office at the above phone number and make an appointment for approximately two weeks after the date of your surgery or on the date instructed by your surgeon outlined in the "After Visit Summary".  RANGE OF MOTION AND STRENGTHENING EXERCISES  Rehabilitation of the knee is important following a knee injury or an operation. After just a few days of immobilization, the muscles of the thigh which control the knee become weakened and shrink (atrophy). Knee exercises are designed to build up  the tone and strength of the thigh muscles and to improve knee motion. Often times heat used for twenty to thirty minutes before working out will loosen up your tissues and help with improving the range of motion but do not use heat for the first two weeks following surgery. These exercises can be done on a training (exercise) mat, on the floor, on a table or on a bed. Use what ever works the best and is most comfortable for you Knee exercises include:  Leg Lifts - While your knee is still immobilized in a splint or cast, you can do straight leg raises. Lift the leg to 60 degrees, hold for 3 sec, and slowly lower the leg. Repeat 10-20 times 2-3 times daily. Perform this exercise against resistance later as your knee gets better.  Quad and Hamstring Sets - Tighten up the muscle on the front of the thigh (Quad) and hold for 5-10 sec. Repeat this 10-20 times hourly. Hamstring sets are done by pushing the foot backward against an object and holding for 5-10 sec. Repeat as with quad sets.  Leg Slides: Lying on your back, slowly slide your foot toward your buttocks, bending your knee up off the floor (only go as far as is comfortable). Then slowly slide your foot back down until your leg is flat on the floor again. Angel Wings: Lying on your back spread your legs to the side as far apart as you can without causing discomfort.  A rehabilitation program following serious knee injuries can speed recovery and prevent re-injury in the future due to weakened muscles. Contact your doctor or a physical therapist for more information on knee rehabilitation.   POST-OPERATIVE OPIOID TAPER INSTRUCTIONS: It is important to wean off of your opioid medication as soon as possible. If you do not need pain medication after your surgery it is ok to stop day one. Opioids include: Codeine, Hydrocodone(Norco, Vicodin), Oxycodone(Percocet, oxycontin) and hydromorphone amongst others.  Long term and even short term use of opiods can  cause: Increased pain response Dependence Constipation Depression Respiratory depression And more.  Withdrawal symptoms can include Flu like symptoms Nausea, vomiting And more Techniques to manage these symptoms Hydrate well Eat regular healthy meals Stay active Use relaxation techniques(deep breathing, meditating, yoga) Do Not substitute Alcohol to help with tapering If you have been on opioids for less than two weeks and do not have pain than it is ok to stop all together.  Plan to   wean off of opioids This plan should start within one week post op of your joint replacement. Maintain the same interval or time between taking each dose and first decrease the dose.  Cut the total daily intake of opioids by one tablet each day Next start to increase the time between doses. The last dose that should be eliminated is the evening dose.   IF YOU ARE TRANSFERRED TO A SKILLED REHAB FACILITY If the patient is transferred to a skilled rehab facility following release from the hospital, a list of the current medications will be sent to the facility for the patient to continue.  When discharged from the skilled rehab facility, please have the facility set up the patient's Home Health Physical Therapy prior to being released. Also, the skilled facility will be responsible for providing the patient with their medications at time of release from the facility to include their pain medication, the muscle relaxants, and their blood thinner medication. If the patient is still at the rehab facility at time of the two week follow up appointment, the skilled rehab facility will also need to assist the patient in arranging follow up appointment in our office and any transportation needs.  MAKE SURE YOU:  Understand these instructions.  Get help right away if you are not doing well or get worse.   DENTAL ANTIBIOTICS:  In most cases prophylactic antibiotics for Dental procdeures after total joint surgery are  not necessary.  Exceptions are as follows:  1. History of prior total joint infection  2. Severely immunocompromised (Organ Transplant, cancer chemotherapy, Rheumatoid biologic meds such as Humera)  3. Poorly controlled diabetes (A1C &gt; 8.0, blood glucose over 200)  If you have one of these conditions, contact your surgeon for an antibiotic prescription, prior to your dental procedure.    Pick up stool softner and laxative for home use following surgery while on pain medications. Do not submerge incision under water. Please use good hand washing techniques while changing dressing each day. May shower starting three days after surgery. Please use a clean towel to pat the incision dry following showers. Continue to use ice for pain and swelling after surgery. Do not use any lotions or creams on the incision until instructed by your surgeon.  Information on my medicine - XARELTO (Rivaroxaban)  Why was Xarelto prescribed for you? Xarelto was prescribed for you to reduce the risk of blood clots forming after orthopedic surgery. The medical term for these abnormal blood clots is venous thromboembolism (VTE).  What do you need to know about xarelto ? Take your Xarelto ONCE DAILY at the same time every day. You may take it either with or without food.  If you have difficulty swallowing the tablet whole, you may crush it and mix in applesauce just prior to taking your dose.  Take Xarelto exactly as prescribed by your doctor and DO NOT stop taking Xarelto without talking to the doctor who prescribed the medication.  Stopping without other VTE prevention medication to take the place of Xarelto may increase your risk of developing a clot.  After discharge, you should have regular check-up appointments with your healthcare provider that is prescribing your Xarelto.    What do you do if you miss a dose? If you miss a dose, take it as soon as you remember on the same day then  continue your regularly scheduled once daily regimen the next day. Do not take two doses of Xarelto on the same day.   Important   Safety Information A possible side effect of Xarelto is bleeding. You should call your healthcare provider right away if you experience any of the following: Bleeding from an injury or your nose that does not stop. Unusual colored urine (red or dark brown) or unusual colored stools (red or black). Unusual bruising for unknown reasons. A serious fall or if you hit your head (even if there is no bleeding).  Some medicines may interact with Xarelto and might increase your risk of bleeding while on Xarelto. To help avoid this, consult your healthcare provider or pharmacist prior to using any new prescription or non-prescription medications, including herbals, vitamins, non-steroidal anti-inflammatory drugs (NSAIDs) and supplements.  This website has more information on Xarelto: www.xarelto.com.    

## 2024-11-15 NOTE — Interval H&P Note (Signed)
 History and Physical Interval Note:  11/15/2024 10:25 AM  Morgan Elliott  has presented today for surgery, with the diagnosis of Right knee osteoarthritis.  The various methods of treatment have been discussed with the patient and family. After consideration of risks, benefits and other options for treatment, the patient has consented to  Procedures: ARTHROPLASTY, KNEE, TOTAL (Right) as a surgical intervention.  The patient's history has been reviewed, patient examined, no change in status, stable for surgery.  I have reviewed the patient's chart and labs.  Questions were answered to the patient's satisfaction.     [name]

## 2024-11-15 NOTE — Anesthesia Postprocedure Evaluation (Signed)
"   Anesthesia Post Note  Patient: Morgan Elliott  Procedure(s) Performed: ARTHROPLASTY, KNEE, TOTAL (Right: Knee)     Patient location during evaluation: PACU Anesthesia Type: Spinal Level of consciousness: awake and alert Pain management: pain level controlled Vital Signs Assessment: post-procedure vital signs reviewed and stable Respiratory status: spontaneous breathing and respiratory function stable Cardiovascular status: blood pressure returned to baseline and stable Postop Assessment: spinal receding Anesthetic complications: no   No notable events documented.  Last Vitals:  Vitals:   11/15/24 1446 11/15/24 1645  BP: 113/76 126/73  Pulse: 72 66  Resp: 17 17  Temp: (!) 36.3 C   SpO2: 91% 96%    Last Pain:  Vitals:   11/15/24 1609  TempSrc:   PainSc: 7                  [name]      "

## 2024-11-15 NOTE — Evaluation (Signed)
 Physical Therapy Evaluation Patient Details Name: Morgan Elliott MRN: [id number] DOB: 01/30/1946 Today's Date: 11/15/2024  History of Present Illness  79 yo female presents to therapy s/p R TKA on 11/15/2024 due to failure of conservative measures. Pt PMH includes but is not limited to: OA, HTN, L ba ca, anxiety, CKD III, depression, GERD, hernia, IBS, cervical fusion, L THA (2017) and L TKA (2022).  Clinical Impression    Morgan Elliott is a 79 y.o. female POD 0 s/p R TKA. Patient reports IND with mobility at baseline. Patient is now limited by functional impairments (see PT problem list below) and requires CGA for bed mobility and CGA for transfers. Patient was able to ambulate 25 feet with RW and CGA level of assist. Patient instructed in exercise to facilitate ROM and circulation to manage edema. Patient will benefit from continued skilled PT interventions to address impairments and progress towards PLOF. Acute PT will follow to progress mobility and stair training in preparation for safe discharge home with family support and OPPT services.       If plan is discharge home, recommend the following: A little help with walking and/or transfers;A little help with bathing/dressing/bathroom;Assistance with cooking/housework;Assist for transportation;Help with stairs or ramp for entrance   Can travel by private vehicle        Equipment Recommendations None recommended by PT  Recommendations for Other Services       Functional Status Assessment Patient has had a recent decline in their functional status and demonstrates the ability to make significant improvements in function in a reasonable and predictable amount of time.     Precautions / Restrictions Precautions Precautions: Fall;Knee Restrictions Weight Bearing Restrictions Per Provider Order: Yes RLE Weight Bearing Per Provider Order: Weight bearing as tolerated      Mobility  Bed Mobility Overal bed mobility: Needs  Assistance Bed Mobility: Supine to Sit     Supine to sit: Contact guard, HOB elevated     General bed mobility comments: min cues    Transfers Overall transfer level: Needs assistance Equipment used: Rolling walker (2 wheels) Transfers: Sit to/from Stand Sit to Stand: Contact guard assist           General transfer comment: min cues    Ambulation/Gait Ambulation/Gait assistance: Contact guard assist Gait Distance (Feet): 25 Feet Assistive device: Rolling walker (2 wheels) Gait Pattern/deviations: Step-to pattern, Decreased stance time - right, Antalgic, Trunk flexed Gait velocity: decreased     General Gait Details: slight trunk flexion with B UE support at RW to offload R LE in stance phase, min cues for safety and sequencing  Stairs            Wheelchair Mobility     Tilt Bed    Modified Rankin (Stroke Patients Only)       Balance Overall balance assessment: Needs assistance Sitting-balance support: Feet supported Sitting balance-Leahy Scale: Good     Standing balance support: Bilateral upper extremity supported, During functional activity, Reliant on assistive device for balance Standing balance-Leahy Scale: Poor                               Pertinent Vitals/Pain Pain Assessment Pain Assessment: 0-10 Pain Score: 8  Pain Location: R knee and LE Pain Descriptors / Indicators: Aching, Constant, Discomfort, Dull, Grimacing, Operative site guarding Pain Intervention(s): Limited activity within patient's tolerance, Monitored during session, Premedicated before session, Patient requesting pain meds-RN notified, Ice  applied    Home Living Family/patient expects to be discharged to:: Private residence Living Arrangements: Spouse/significant other Available Help at Discharge: Family Type of Home: House Home Access: Stairs to enter Entrance Stairs-Rails: Right;Left (2 steps to enter from garage with grab bar) Entrance Stairs-Number of  Steps: 4   Home Layout: Multi-level;Able to live on main level with bedroom/bathroom Home Equipment: Rolling Walker (2 wheels);Cane - single point;Toilet riser      Prior Function Prior Level of Function : Independent/Modified Independent;Driving             Mobility Comments: IND no AD for all ADLs self care tasks and IADLs.       Extremity/Trunk Assessment        Lower Extremity Assessment Lower Extremity Assessment: RLE deficits/detail RLE Deficits / Details: ankle DF/PF 5/5;SRL < 10 degree RLE Sensation: WNL    Cervical / Trunk Assessment Cervical / Trunk Assessment: Normal  Communication   Communication Communication: No apparent difficulties    Cognition Arousal: Alert Behavior During Therapy: WFL for tasks assessed/performed   PT - Cognitive impairments: No apparent impairments                         Following commands: Intact       Cueing       General Comments      Exercises Total Joint Exercises Ankle Circles/Pumps: AROM, Both, 10 reps   Assessment/Plan    PT Assessment Patient needs continued PT services  PT Problem List Decreased strength;Decreased range of motion;Decreased activity tolerance;Decreased balance;Decreased mobility;Decreased coordination;Pain       PT Treatment Interventions DME instruction;Gait training;Stair training;Functional mobility training;Therapeutic activities;Therapeutic exercise;Balance training;Neuromuscular re-education;Patient/family education;Modalities    PT Goals (Current goals can be found in the Care Plan section)  Acute Rehab PT Goals Patient Stated Goal: to be able to walk pain free and no AD PT Goal Formulation: With patient Time For Goal Achievement: 11/29/24 Potential to Achieve Goals: Good    Frequency 7X/week     Co-evaluation               AM-PAC PT 6 Clicks Mobility  Outcome Measure Help needed turning from your back to your side while in a flat bed without using  bedrails?: None Help needed moving from lying on your back to sitting on the side of a flat bed without using bedrails?: A Little Help needed moving to and from a bed to a chair (including a wheelchair)?: A Little Help needed standing up from a chair using your arms (e.g., wheelchair or bedside chair)?: A Little Help needed to walk in hospital room?: A Little Help needed climbing 3-5 steps with a railing? : A Lot 6 Click Score: 18    End of Session Equipment Utilized During Treatment: Gait belt Activity Tolerance: Patient limited by pain Patient left: in chair;with call bell/phone within reach;with nursing/sitter in room Nurse Communication: Mobility status;Other (comment) (IV leaking and pain--meds administered prior to PT) PT Visit Diagnosis: Unsteadiness on feet (R26.81);Other abnormalities of gait and mobility (R26.89);Muscle weakness (generalized) (M62.81);Difficulty in walking, not elsewhere classified (R26.2);Pain Pain - Right/Left: Right Pain - part of body: Knee;Leg    Time: 8357-8292 PT Time Calculation (min) (ACUTE ONLY): 25 min   Charges:   PT Evaluation $PT Eval Low Complexity: 1 Low PT Treatments $Therapeutic Activity: 8-22 mins PT General Charges $$ ACUTE PT VISIT: 1 Visit         [place], PT Acute Rehab  [place] VEAR Drone 11/15/2024, 5:38 PM

## 2024-11-15 NOTE — Anesthesia Procedure Notes (Signed)
 Spinal  Patient location during procedure: OR Start time: 11/15/2024 11:25 AM End time: 11/15/2024 11:30 AM Reason for block: surgical anesthesia  Staffing Performed: anesthesiologist  Authorized by: Epifanio Charleston, MD   Performed by: Epifanio Charleston, MD  Preanesthetic Checklist Completed: patient identified, IV checked, site marked, risks and benefits discussed, surgical consent, monitors and equipment checked, pre-op evaluation and timeout performed Spinal Block Patient position: sitting Prep: DuraPrep Patient monitoring: heart rate, cardiac monitor, continuous pulse ox and blood pressure Approach: midline Location: L3-4 Injection technique: single-shot Needle Needle type: Pencan  Needle gauge: 24 G Needle length: 9 cm Assessment Sensory level: T4 Events: CSF return

## 2024-11-15 NOTE — Transfer of Care (Signed)
 Immediate Anesthesia Transfer of Care Note  Patient: Morgan Elliott  Procedure(s) Performed: Procedures: ARTHROPLASTY, KNEE, TOTAL (Right)  Patient Location: PACU  Anesthesia Type:Spinal  Level of Consciousness:  sedated, patient cooperative and responds to stimulation  Airway & Oxygen Therapy:Patient Spontanous Breathing and Patient connected to face mask oxgen  Post-op Assessment:  Report given to PACU RN and Post -op Vital signs reviewed and stable  Post vital signs:  Reviewed and stable  Last Vitals:  Vitals:   11/15/24 1055 11/15/24 1100  BP: 129/74 133/80  Pulse: 81 84  Resp: (!) 9 15  Temp:    SpO2: 94% 97%    Complications: No apparent anesthesia complications

## 2024-11-15 NOTE — Plan of Care (Signed)
" °  Problem: Clinical Measurements: Goal: Ability to maintain clinical measurements within normal limits Outcome: Progressing Goal: Postoperative complications will be avoided or minimized Outcome: Progressing   "

## 2024-11-16 ENCOUNTER — Other Ambulatory Visit (HOSPITAL_COMMUNITY): Payer: Self-pay

## 2024-11-16 ENCOUNTER — Encounter (HOSPITAL_COMMUNITY): Payer: Self-pay | Admitting: Orthopedic Surgery

## 2024-11-16 DIAGNOSIS — M1711 Unilateral primary osteoarthritis, right knee: Secondary | ICD-10-CM | POA: Diagnosis not present

## 2024-11-16 LAB — BASIC METABOLIC PANEL WITH GFR
Anion gap: 6 (ref 5–15)
BUN: 9 mg/dL (ref 8–23)
CO2: 26 mmol/L (ref 22–32)
Calcium: 8.5 mg/dL — ABNORMAL LOW (ref 8.9–10.3)
Chloride: 95 mmol/L — ABNORMAL LOW (ref 98–111)
Creatinine, Ser: 0.73 mg/dL (ref 0.44–1.00)
GFR, Estimated: >60 mL/min
Glucose, Bld: 162 mg/dL — ABNORMAL HIGH (ref 70–99)
Potassium: 4.5 mmol/L (ref 3.5–5.1)
Sodium: 127 mmol/L — ABNORMAL LOW (ref 135–145)

## 2024-11-16 LAB — CBC
HCT: 31.2 % — ABNORMAL LOW (ref 36.0–46.0)
Hemoglobin: 10.6 g/dL — ABNORMAL LOW (ref 12.0–15.0)
MCH: 33.3 pg (ref 26.0–34.0)
MCHC: 34 g/dL (ref 30.0–36.0)
MCV: 98.1 fL (ref 80.0–100.0)
Platelets: 256 10*3/uL (ref 150–400)
RBC: 3.18 MIL/uL — ABNORMAL LOW (ref 3.87–5.11)
RDW: 14.7 % (ref 11.5–15.5)
WBC: 9.5 10*3/uL (ref 4.0–10.5)
nRBC: 0 % (ref 0.0–0.2)

## 2024-11-16 MED ORDER — ONDANSETRON HCL 4 MG PO TABS
4.0000 mg | ORAL_TABLET | Freq: Four times a day (QID) | ORAL | 0 refills | Status: AC | PRN
  Filled 2024-11-16: qty 20, 5d supply, fill #0

## 2024-11-16 MED ORDER — OXYCODONE HCL 5 MG PO TABS
5.0000 mg | ORAL_TABLET | ORAL | 0 refills | Status: AC | PRN
  Filled 2024-11-16: qty 42, 7d supply, fill #0

## 2024-11-16 MED ORDER — RIVAROXABAN 10 MG PO TABS
10.0000 mg | ORAL_TABLET | Freq: Every day | ORAL | 0 refills | Status: DC
  Filled 2024-11-16: qty 20, 20d supply, fill #0

## 2024-11-16 MED ORDER — RIVAROXABAN 10 MG PO TABS
10.0000 mg | ORAL_TABLET | Freq: Every day | ORAL | 0 refills | Status: AC
  Filled 2024-11-16: qty 20, 20d supply, fill #0

## 2024-11-16 MED ORDER — TRAMADOL HCL 50 MG PO TABS
50.0000 mg | ORAL_TABLET | Freq: Four times a day (QID) | ORAL | 0 refills | Status: AC | PRN
  Filled 2024-11-16: qty 40, 5d supply, fill #0

## 2024-11-16 MED ORDER — METHOCARBAMOL 500 MG PO TABS
500.0000 mg | ORAL_TABLET | Freq: Four times a day (QID) | ORAL | 0 refills | Status: AC | PRN
  Filled 2024-11-16: qty 40, 10d supply, fill #0

## 2024-11-16 NOTE — Progress Notes (Signed)
 "  Subjective: 1 Day Post-Op Procedures (LRB): ARTHROPLASTY, KNEE, TOTAL (Right) Patient reports pain as mild.   Patient seen in rounds by Dr. Melodi. Patient is well, and has had no acute complaints or problems No issues overnight. Denies chest pain, SOB, or calf pain. Foley catheter removed this AM.  We will continue therapy today, ambulated 25' yesterday.   Objective: Vital signs in last 24 hours: Temp:  [97.3 F (36.3 C)-98.2 F (36.8 C)] 97.6 F (36.4 C) (01/27 0508) Pulse Rate:  [62-86] 66 (01/27 0508) Resp:  [9-22] 15 (01/27 0508) BP: (86-144)/(56-84) 120/83 (01/27 0508) SpO2:  [91 %-100 %] 97 % (01/27 0508) Weight:  [70.3 kg] 70.3 kg (01/26 0955)  Intake/Output from previous day:  Intake/Output Summary (Last 24 hours) at 11/16/2024 0802 Last data filed at 11/16/2024 0600 Gross per 24 hour  Intake 3949.45 ml  Output 1475 ml  Net 2474.45 ml     Intake/Output this shift: No intake/output data recorded.  Labs: Recent Labs    11/16/24 0357  HGB 10.6*   Recent Labs    11/16/24 0357  WBC 9.5  RBC 3.18*  HCT 31.2*  PLT 256   Recent Labs    11/16/24 0357  NA 127*  K 4.5  CL 95*  CO2 26  BUN 9  CREATININE 0.73  GLUCOSE 162*  CALCIUM  8.5*   No results for input(s): LABPT, INR in the last 72 hours.  Exam: General - Patient is Alert and Oriented Extremity - Neurologically intact Neurovascular intact Sensation intact distally Dorsiflexion/Plantar flexion intact Dressing - dressing C/D/I Motor Function - intact, moving foot and toes well on exam.   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   Depression    Dysrhythmia    occasional tachycardia  when she worked   GERD (gastroesophageal reflux disease)    H/O hiatal hernia    Hematoma     right breast   Hypertension    borderline   IBS (irritable bowel syndrome)    OCD (obsessive compulsive disorder)    Osteopenia    Pneumonia    SVD (spontaneous vaginal delivery)    x 1   Varicose veins of both lower extremities     Assessment/Plan: 1 Day Post-Op Procedures (LRB): ARTHROPLASTY, KNEE, TOTAL (Right) Principal Problem:   Arthrofibrosis of knee joint, right  Estimated body mass index is 25.79 kg/m as calculated from the following:   Height as of this encounter: 5' 5 (1.651 m).   Weight as of this encounter: 70.3 kg. Advance diet Up with therapy D/C IV fluids   Patient's anticipated LOS is less than 2 midnights, meeting these requirements: - Lives within 1 hour of care - Has a competent adult at home to recover with post-op recover - NO history of             - Chronic pain requiring opiods             - Diabetes             - Coronary Artery Disease             - Heart failure             - Heart attack             -  Stroke             - DVT/VTE             - Cardiac arrhythmia             - Respiratory Failure/COPD             - Renal failure             - Anemia             - Advanced Liver disease   DVT Prophylaxis - Xarelto  Weight bearing as tolerated. Continue therapy.  Sodium low this AM, rest of electrolytes WNL. No action needed.  Plan is to go Home after hospital stay. Plan for discharge later today if progresses with therapy and meeting goals. Scheduled for OPPT at [facility]. Follow-up in the office in 2 weeks.  The PDMP database was reviewed today prior to any opioid medications being prescribed to this patient.  Roxie Mess, PA-C Orthopedic Surgery [phone] 11/16/2024, 8:02 AM  "

## 2024-11-16 NOTE — Progress Notes (Signed)
 Physical Therapy Treatment Patient Details Name: Morgan Elliott MRN: [id number] DOB: 1946-05-01 Today's Date: 11/16/2024   History of Present Illness 79 yo female presents to therapy s/p R TKA on 11/15/2024 due to failure of conservative measures. Pt PMH includes but is not limited to: OA, HTN, L ba ca, anxiety, CKD III, depression, GERD, hernia, IBS, cervical fusion, L THA (2017) and L TKA (2022).    PT Comments  Pt agreeable to therapy session. Pain rated 7/10. Reviewed/practiced exercises, gait training, and stair training. Issued HEP for pt to follow until she begins OPPT. PT education completed.    If plan is discharge home, recommend the following: A little help with walking and/or transfers;A little help with bathing/dressing/bathroom;Assistance with cooking/housework;Assist for transportation;Help with stairs or ramp for entrance   Can travel by private vehicle        Equipment Recommendations  None recommended by PT    Recommendations for Other Services       Precautions / Restrictions Precautions Precautions: Fall;Knee Restrictions Weight Bearing Restrictions Per Provider Order: No RLE Weight Bearing Per Provider Order: Weight bearing as tolerated     Mobility  Bed Mobility Overal bed mobility: Needs Assistance Bed Mobility: Supine to Sit     Supine to sit: Supervision, HOB elevated, Used rails     General bed mobility comments: Cues provided as needed. No physical assist. Pt used gait belt as leg lifter    Transfers Overall transfer level: Needs assistance Equipment used: Rolling walker (2 wheels) Transfers: Sit to/from Stand Sit to Stand: Contact guard assist           General transfer comment: Cues for safety, technique, hand/LE placement. Pt preferred to pull up on walker despite cues from therapist (her preferred manner)    Ambulation/Gait Ambulation/Gait assistance: Contact guard assist Gait Distance (Feet): 55 Feet Assistive device: Rolling  walker (2 wheels) Gait Pattern/deviations: Step-to pattern       General Gait Details: Cues for safety, sequencing. No LOB with RW use. Tolerated distance well   Stairs Stairs: Yes Stairs assistance: Contact guard assist Stair Management: One rail Right, Sideways Number of Stairs: 2 General stair comments: 2 hands on 1 railing. Cues for safety, sequencing, technique. Increased time.   Wheelchair Mobility     Tilt Bed    Modified Rankin (Stroke Patients Only)       Balance Overall balance assessment: Needs assistance         Standing balance support: During functional activity, Reliant on assistive device for balance Standing balance-Leahy Scale: Fair                              Hotel Manager: No apparent difficulties  Cognition Arousal: Alert Behavior During Therapy: WFL for tasks assessed/performed   PT - Cognitive impairments: No apparent impairments                         Following commands: Intact      Cueing    Exercises Total Joint Exercises Ankle Circles/Pumps: AROM, Both, 10 reps Quad Sets: AROM, Right, 10 reps Heel Slides: AAROM, Right, 10 reps Hip ABduction/ADduction: AROM, Right, 10 reps Straight Leg Raises: AAROM, Right, 10 reps Long Arc Quad: AROM, Right, 5 reps Knee Flexion: AROM, Right, 10 reps, Seated Goniometric ROM: ~10-60 degrees    General Comments        Pertinent Vitals/Pain Pain Assessment Pain Assessment: 0-10  Pain Score: 7  Pain Location: R knee/thigh Pain Descriptors / Indicators: Aching, Discomfort, Grimacing, Operative site guarding Pain Intervention(s): Limited activity within patient's tolerance, Monitored during session, Ice applied, Repositioned, Patient requesting pain meds-RN notified    Home Living                          Prior Function            PT Goals (current goals can now be found in the care plan section) Progress towards PT goals:  Progressing toward goals    Frequency    7X/week      PT Plan      Co-evaluation              AM-PAC PT 6 Clicks Mobility   Outcome Measure  Help needed turning from your back to your side while in a flat bed without using bedrails?: None Help needed moving from lying on your back to sitting on the side of a flat bed without using bedrails?: A Little Help needed moving to and from a bed to a chair (including a wheelchair)?: A Little Help needed standing up from a chair using your arms (e.g., wheelchair or bedside chair)?: A Little Help needed to walk in hospital room?: A Little Help needed climbing 3-5 steps with a railing? : A Little 6 Click Score: 19    End of Session Equipment Utilized During Treatment: Gait belt Activity Tolerance: Patient tolerated treatment well Patient left: in chair;with call bell/phone within reach   PT Visit Diagnosis: Unsteadiness on feet (R26.81);Other abnormalities of gait and mobility (R26.89);Muscle weakness (generalized) (M62.81);Difficulty in walking, not elsewhere classified (R26.2);Pain Pain - Right/Left: Right Pain - part of body: Knee     Time: 1035-1105 PT Time Calculation (min) (ACUTE ONLY): 30 min  Charges:    $Gait Training: 8-22 mins $Therapeutic Exercise: 8-22 mins PT General Charges $$ ACUTE PT VISIT: 1 Visit                        Dannial SQUIBB, PT Acute Rehabilitation  Office: [phone]

## 2024-11-16 NOTE — Progress Notes (Signed)
 Discharge meds in a secure bag delivered to patient by this RN

## 2024-11-16 NOTE — Care Management Obs Status (Signed)
 MEDICARE OBSERVATION STATUS NOTIFICATION   Patient Details  Name: Morgan Elliott MRN: [id number] Date of Birth: 1946/07/24   Medicare Observation Status Notification Given:  Yes    Alfonse JONELLE Rex, RN 11/16/2024, 10:09 AM

## 2024-11-16 NOTE — TOC Transition Note (Signed)
 Transition of Care [facility]) - Discharge Note   Patient Details  Name: Morgan Elliott MRN: [id number] Date of Birth: 1946-09-30  [facility] CM/SW Contact:  NORMAN ASPEN, LCSW Phone Number: 11/16/2024, 11:03 AM   Clinical Narrative:     Met with pt who confirms she has needed DME in the home.  OPPT already arranged with [facility].  No further IP CM needs.  Final next level of care: OP Rehab Barriers to Discharge: No Barriers Identified   Patient Goals and CMS Choice Patient states their goals for this hospitalization and ongoing recovery are:: return home          Discharge Placement                       Discharge Plan and Services Additional resources added to the After Visit Summary for                  DME Arranged: N/A DME Agency: NA                  Social Drivers of Health (SDOH) Interventions SDOH Screenings   Food Insecurity: No Food Insecurity (11/15/2024)  Housing: Low Risk (11/15/2024)  Transportation Needs: No Transportation Needs (11/15/2024)  Utilities: Not At Risk (11/15/2024)  Social Connections: Moderately Isolated (11/15/2024)  Stress: Stress Concern Present (05/05/2024)   Received from [facility]  Tobacco Use: Low Risk (11/15/2024)     Readmission Risk Interventions     No data to display

## 2024-11-22 ENCOUNTER — Other Ambulatory Visit (HOSPITAL_COMMUNITY): Payer: Self-pay

## 2024-11-24 ENCOUNTER — Ambulatory Visit (HOSPITAL_COMMUNITY)
Admission: RE | Admit: 2024-11-24 | Discharge: 2024-11-24 | Disposition: A | Source: Ambulatory Visit | Attending: Orthopedic Surgery | Admitting: Orthopedic Surgery

## 2024-11-24 ENCOUNTER — Other Ambulatory Visit (HOSPITAL_COMMUNITY): Payer: Self-pay | Admitting: Orthopedic Surgery

## 2024-11-24 DIAGNOSIS — M7989 Other specified soft tissue disorders: Secondary | ICD-10-CM

## 2024-11-24 DIAGNOSIS — M79604 Pain in right leg: Secondary | ICD-10-CM
